# Patient Record
Sex: Female | Born: 1950 | Race: White | Hispanic: No | Smoking: Never smoker
Health system: Southern US, Community
[De-identification: ages and names within clinical notes are randomized; demographics above are authoritative.]

## PROBLEM LIST (undated history)

## (undated) DIAGNOSIS — N189 Chronic kidney disease, unspecified: Secondary | ICD-10-CM

## (undated) DIAGNOSIS — R296 Repeated falls: Secondary | ICD-10-CM

## (undated) DIAGNOSIS — I82409 Acute embolism and thrombosis of unspecified deep veins of unspecified lower extremity: Secondary | ICD-10-CM

## (undated) DIAGNOSIS — D649 Anemia, unspecified: Secondary | ICD-10-CM

## (undated) DIAGNOSIS — L0390 Cellulitis, unspecified: Secondary | ICD-10-CM

## (undated) DIAGNOSIS — Z9889 Other specified postprocedural states: Secondary | ICD-10-CM

## (undated) DIAGNOSIS — E079 Disorder of thyroid, unspecified: Secondary | ICD-10-CM

## (undated) DIAGNOSIS — M797 Fibromyalgia: Secondary | ICD-10-CM

## (undated) DIAGNOSIS — A6920 Lyme disease, unspecified: Secondary | ICD-10-CM

## (undated) DIAGNOSIS — F22 Delusional disorders: Secondary | ICD-10-CM

## (undated) DIAGNOSIS — F909 Attention-deficit hyperactivity disorder, unspecified type: Secondary | ICD-10-CM

## (undated) DIAGNOSIS — F32A Depression, unspecified: Secondary | ICD-10-CM

## (undated) DIAGNOSIS — Z8719 Personal history of other diseases of the digestive system: Secondary | ICD-10-CM

## (undated) DIAGNOSIS — G43109 Migraine with aura, not intractable, without status migrainosus: Secondary | ICD-10-CM

## (undated) DIAGNOSIS — G8929 Other chronic pain: Secondary | ICD-10-CM

## (undated) HISTORY — DX: Chronic kidney disease, unspecified: N18.9

## (undated) HISTORY — DX: Fibromyalgia: M79.7

## (undated) HISTORY — DX: Attention-deficit hyperactivity disorder, unspecified type: F90.9

## (undated) HISTORY — DX: Repeated falls: R29.6

## (undated) HISTORY — DX: Other chronic pain: G89.29

## (undated) HISTORY — DX: Personal history of other diseases of the digestive system: Z87.19

## (undated) HISTORY — PX: EYE SURGERY: SHX253

## (undated) HISTORY — DX: Disorder of thyroid, unspecified: E07.9

## (undated) HISTORY — DX: Migraine with aura, not intractable, without status migrainosus: G43.109

## (undated) HISTORY — DX: Personal history of other diseases of the digestive system: Z98.890

## (undated) HISTORY — DX: Lyme disease, unspecified: A69.20

## (undated) HISTORY — DX: Delusional disorders: F22

## (undated) HISTORY — DX: Anemia, unspecified: D64.9

## (undated) HISTORY — DX: Depression, unspecified: F32.A

## (undated) HISTORY — PX: CERVICAL FUSION: SHX112

## (undated) HISTORY — PX: TOTAL HIP ARTHROPLASTY: SHX124

---

## 1969-07-19 HISTORY — PX: KIDNEY SURGERY: SHX687

## 1972-07-19 HISTORY — PX: APPENDECTOMY: SHX54

## 1983-07-20 HISTORY — PX: LAMINECTOMY: SHX219

## 1997-07-19 HISTORY — PX: OTHER SURGICAL HISTORY: SHX169

## 1999-06-23 ENCOUNTER — Ambulatory Visit (HOSPITAL_COMMUNITY): Admission: RE | Admit: 1999-06-23 | Discharge: 1999-06-23 | Payer: Self-pay | Admitting: Gastroenterology

## 2000-04-28 ENCOUNTER — Encounter: Admission: RE | Admit: 2000-04-28 | Discharge: 2000-04-28 | Payer: Self-pay | Admitting: Internal Medicine

## 2000-04-28 ENCOUNTER — Encounter: Payer: Self-pay | Admitting: Internal Medicine

## 2000-05-27 ENCOUNTER — Encounter: Admission: RE | Admit: 2000-05-27 | Discharge: 2000-05-27 | Payer: Self-pay | Admitting: Internal Medicine

## 2000-10-11 ENCOUNTER — Emergency Department (HOSPITAL_COMMUNITY): Admission: EM | Admit: 2000-10-11 | Discharge: 2000-10-11 | Payer: Self-pay | Admitting: *Deleted

## 2000-10-13 ENCOUNTER — Encounter: Payer: Self-pay | Admitting: Internal Medicine

## 2000-10-13 ENCOUNTER — Encounter: Admission: RE | Admit: 2000-10-13 | Discharge: 2000-10-13 | Payer: Self-pay | Admitting: Internal Medicine

## 2000-11-02 ENCOUNTER — Encounter: Payer: Self-pay | Admitting: Internal Medicine

## 2000-11-02 ENCOUNTER — Encounter: Admission: RE | Admit: 2000-11-02 | Discharge: 2000-11-02 | Payer: Self-pay | Admitting: Internal Medicine

## 2001-09-27 ENCOUNTER — Encounter: Admission: RE | Admit: 2001-09-27 | Discharge: 2001-09-27 | Payer: Self-pay | Admitting: Internal Medicine

## 2001-09-27 ENCOUNTER — Encounter: Payer: Self-pay | Admitting: Internal Medicine

## 2001-12-21 ENCOUNTER — Encounter: Payer: Self-pay | Admitting: Pulmonary Disease

## 2001-12-21 ENCOUNTER — Ambulatory Visit (HOSPITAL_COMMUNITY): Admission: RE | Admit: 2001-12-21 | Discharge: 2001-12-21 | Payer: Self-pay | Admitting: Pulmonary Disease

## 2001-12-28 ENCOUNTER — Ambulatory Visit (HOSPITAL_COMMUNITY): Admission: RE | Admit: 2001-12-28 | Discharge: 2001-12-28 | Payer: Self-pay | Admitting: Pulmonary Disease

## 2002-05-21 ENCOUNTER — Encounter: Admission: RE | Admit: 2002-05-21 | Discharge: 2002-05-21 | Payer: Self-pay | Admitting: Internal Medicine

## 2002-05-21 ENCOUNTER — Encounter: Payer: Self-pay | Admitting: Internal Medicine

## 2003-02-13 ENCOUNTER — Encounter: Payer: Self-pay | Admitting: Internal Medicine

## 2003-02-13 ENCOUNTER — Encounter: Admission: RE | Admit: 2003-02-13 | Discharge: 2003-02-13 | Payer: Self-pay | Admitting: Internal Medicine

## 2003-04-10 ENCOUNTER — Encounter
Admission: RE | Admit: 2003-04-10 | Discharge: 2003-07-09 | Payer: Self-pay | Admitting: Physical Medicine & Rehabilitation

## 2003-07-08 ENCOUNTER — Encounter: Admission: RE | Admit: 2003-07-08 | Discharge: 2003-07-08 | Payer: Self-pay | Admitting: Orthopedic Surgery

## 2003-07-23 ENCOUNTER — Encounter: Admission: RE | Admit: 2003-07-23 | Discharge: 2003-07-23 | Payer: Self-pay | Admitting: Internal Medicine

## 2003-08-06 ENCOUNTER — Ambulatory Visit (HOSPITAL_COMMUNITY): Admission: RE | Admit: 2003-08-06 | Discharge: 2003-08-06 | Payer: Self-pay | Admitting: Neurology

## 2003-09-04 ENCOUNTER — Ambulatory Visit (HOSPITAL_COMMUNITY): Admission: RE | Admit: 2003-09-04 | Discharge: 2003-09-05 | Payer: Self-pay | Admitting: Neurology

## 2003-09-06 ENCOUNTER — Encounter: Admission: RE | Admit: 2003-09-06 | Discharge: 2003-09-06 | Payer: Self-pay | Admitting: Orthopedic Surgery

## 2005-05-22 ENCOUNTER — Encounter: Admission: RE | Admit: 2005-05-22 | Discharge: 2005-05-22 | Payer: Self-pay | Admitting: Orthopedic Surgery

## 2005-11-26 ENCOUNTER — Encounter: Admission: RE | Admit: 2005-11-26 | Discharge: 2005-11-26 | Payer: Self-pay | Admitting: Internal Medicine

## 2006-08-23 ENCOUNTER — Encounter: Admission: RE | Admit: 2006-08-23 | Discharge: 2006-08-23 | Payer: Self-pay | Admitting: Specialist

## 2006-11-03 ENCOUNTER — Encounter: Admission: RE | Admit: 2006-11-03 | Discharge: 2006-11-03 | Payer: Self-pay | Admitting: Specialist

## 2006-12-02 ENCOUNTER — Other Ambulatory Visit: Admission: RE | Admit: 2006-12-02 | Discharge: 2006-12-02 | Payer: Self-pay | Admitting: Obstetrics and Gynecology

## 2007-01-12 ENCOUNTER — Encounter: Admission: RE | Admit: 2007-01-12 | Discharge: 2007-01-12 | Payer: Self-pay | Admitting: Specialist

## 2007-04-17 ENCOUNTER — Other Ambulatory Visit: Admission: RE | Admit: 2007-04-17 | Discharge: 2007-04-17 | Payer: Self-pay | Admitting: Obstetrics and Gynecology

## 2008-04-30 ENCOUNTER — Encounter: Admission: RE | Admit: 2008-04-30 | Discharge: 2008-04-30 | Payer: Self-pay | Admitting: Obstetrics and Gynecology

## 2008-05-13 ENCOUNTER — Other Ambulatory Visit: Admission: RE | Admit: 2008-05-13 | Discharge: 2008-05-13 | Payer: Self-pay | Admitting: Obstetrics and Gynecology

## 2008-05-22 ENCOUNTER — Inpatient Hospital Stay (HOSPITAL_COMMUNITY): Admission: RE | Admit: 2008-05-22 | Discharge: 2008-05-28 | Payer: Self-pay | Admitting: Orthopedic Surgery

## 2009-01-07 ENCOUNTER — Inpatient Hospital Stay (HOSPITAL_COMMUNITY): Admission: AD | Admit: 2009-01-07 | Discharge: 2009-01-12 | Payer: Self-pay | Admitting: Orthopedic Surgery

## 2009-01-20 ENCOUNTER — Observation Stay (HOSPITAL_COMMUNITY): Admission: EM | Admit: 2009-01-20 | Discharge: 2009-01-21 | Payer: Self-pay | Admitting: Emergency Medicine

## 2009-01-21 ENCOUNTER — Ambulatory Visit: Payer: Self-pay | Admitting: Vascular Surgery

## 2009-01-21 ENCOUNTER — Encounter (INDEPENDENT_AMBULATORY_CARE_PROVIDER_SITE_OTHER): Payer: Self-pay | Admitting: Orthopedic Surgery

## 2009-02-07 ENCOUNTER — Observation Stay (HOSPITAL_COMMUNITY): Admission: EM | Admit: 2009-02-07 | Discharge: 2009-02-08 | Payer: Self-pay | Admitting: Emergency Medicine

## 2009-02-15 ENCOUNTER — Inpatient Hospital Stay (HOSPITAL_COMMUNITY): Admission: RE | Admit: 2009-02-15 | Discharge: 2009-02-20 | Payer: Self-pay | Admitting: Orthopedic Surgery

## 2009-03-27 ENCOUNTER — Emergency Department (HOSPITAL_COMMUNITY): Admission: EM | Admit: 2009-03-27 | Discharge: 2009-03-27 | Payer: Self-pay | Admitting: Emergency Medicine

## 2009-04-25 ENCOUNTER — Other Ambulatory Visit: Admission: RE | Admit: 2009-04-25 | Discharge: 2009-04-25 | Payer: Self-pay | Admitting: Obstetrics and Gynecology

## 2010-02-19 ENCOUNTER — Ambulatory Visit: Payer: Self-pay | Admitting: Vascular Surgery

## 2010-04-02 ENCOUNTER — Ambulatory Visit (HOSPITAL_COMMUNITY): Admission: RE | Admit: 2010-04-02 | Discharge: 2010-04-02 | Payer: Self-pay | Admitting: Orthopedic Surgery

## 2010-05-07 ENCOUNTER — Encounter: Admission: RE | Admit: 2010-05-07 | Discharge: 2010-05-07 | Payer: Self-pay | Admitting: Orthopedic Surgery

## 2010-05-08 ENCOUNTER — Encounter
Admission: RE | Admit: 2010-05-08 | Discharge: 2010-05-08 | Payer: Self-pay | Source: Home / Self Care | Admitting: Orthopedic Surgery

## 2010-08-06 ENCOUNTER — Inpatient Hospital Stay (HOSPITAL_COMMUNITY)
Admission: AD | Admit: 2010-08-06 | Discharge: 2010-08-07 | Payer: Self-pay | Attending: Internal Medicine | Admitting: Internal Medicine

## 2010-08-08 ENCOUNTER — Encounter: Payer: Self-pay | Admitting: Orthopedic Surgery

## 2010-08-08 ENCOUNTER — Encounter: Payer: Self-pay | Admitting: Obstetrics and Gynecology

## 2010-08-08 ENCOUNTER — Encounter: Payer: Self-pay | Admitting: Neurology

## 2010-08-09 ENCOUNTER — Encounter: Payer: Self-pay | Admitting: Obstetrics and Gynecology

## 2010-08-09 ENCOUNTER — Encounter: Payer: Self-pay | Admitting: Orthopedic Surgery

## 2010-08-10 LAB — COMPREHENSIVE METABOLIC PANEL
ALT: 21 U/L (ref 0–35)
AST: 26 U/L (ref 0–37)
Albumin: 3.8 g/dL (ref 3.5–5.2)
Alkaline Phosphatase: 90 U/L (ref 39–117)
BUN: 21 mg/dL (ref 6–23)
CO2: 25 mEq/L (ref 19–32)
Calcium: 9.1 mg/dL (ref 8.4–10.5)
Chloride: 109 mEq/L (ref 96–112)
Creatinine, Ser: 0.91 mg/dL (ref 0.4–1.2)
GFR calc Af Amer: >60 mL/min (ref >60)
GFR calc non Af Amer: >60 mL/min (ref >60)
Glucose, Bld: 92 mg/dL (ref 70–99)
Potassium: 4.1 mEq/L (ref 3.5–5.1)
Sodium: 142 mEq/L (ref 135–145)
Total Bilirubin: 0.4 mg/dL (ref 0.3–1.2)
Total Protein: 6.6 g/dL (ref 6.0–8.3)

## 2010-08-10 LAB — PROTIME-INR
INR: 1.02 (ref 0.00–1.49)
Prothrombin Time: 13.6 seconds (ref 11.6–15.2)

## 2010-08-10 LAB — CBC
HCT: 37.4 % (ref 36.0–46.0)
Hemoglobin: 12.3 g/dL (ref 12.0–15.0)
MCH: 29.2 pg (ref 26.0–34.0)
MCHC: 32.9 g/dL (ref 30.0–36.0)
MCV: 88.8 fL (ref 78.0–100.0)
Platelets: 281 10*3/uL (ref 150–400)
RBC: 4.21 MIL/uL (ref 3.87–5.11)
RDW: 14.2 % (ref 11.5–15.5)
WBC: 6.6 10*3/uL (ref 4.0–10.5)

## 2010-08-10 LAB — APTT: aPTT: 28 seconds (ref 24–37)

## 2010-09-08 ENCOUNTER — Encounter (INDEPENDENT_AMBULATORY_CARE_PROVIDER_SITE_OTHER): Payer: BC Managed Care – HMO | Admitting: Vascular Surgery

## 2010-09-08 DIAGNOSIS — S3289XB Fracture of other parts of pelvis, initial encounter for open fracture: Secondary | ICD-10-CM

## 2010-09-08 DIAGNOSIS — M79609 Pain in unspecified limb: Secondary | ICD-10-CM

## 2010-09-09 NOTE — Consult Note (Signed)
NEW PATIENT CONSULTATION  [name], [name] DOB:  [birth year]                                       09/08/2010 ZOXWR#:[id number]  The patient presents today for discussion regarding complications with a left hip replacement.  She has a very complex history with initial left hip replacement in 2009.  She subsequently had a right hip replacement and complications requiring multiple revisions.  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  She has consulted with multiple physicians regarding this.  She did see an orthopedic specialist in [place], who apparently suggested that this she have a team of vascular surgeons available.  She is quite concerned regarding this and was seen today to determine if vascular compromise is present.  She does not have any history of arterial insufficiency.  She does have extensive history of prior cervical spine fusion.  She has no history of cardiac disease, does not have hypertension.  She does not smoke or drink alcohol.  REVIEW OF SYSTEMS:  Multiply positive.  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  PHYSICAL EXAMINATION:  A well-developed, thin white female in no acute distress.  She is using a wheelchair.  Blood pressure is 117/81, pulse 101, respirations 18, oxygen saturation is 99%.  HEENT is normal.  She does have a 2+ popliteal pulses bilaterally.  Her left lower quadrant does not have any tenderness.  She does have obvious deformity from the impacted left hip.  Neurologic reveals no focal weakness.  I did review her CT scans and discussed this at length with the patient. I explained that these were noncontrast studies but that she does not have any direct impingement on her vascular structures in the  left groin.  She has normal pulse and therefore does not have any evidence of compromise currently.  I explained that if she does have a surgeon who has the expertise for a pelvic reconstruction, he would certainly know whether or not vascular backup was warranted.  I explained that it is not rare to see a vascular injury during orthopedic manipulation and that this is addressed if this does occur.  She was somewhat relieved with this discussion and will continue to seek orthopedic specialty care for correction of her problem.  She will see [place] on an as-needed basis.    Larina Earthly, M.Young.  TFE/MEDQ  Young:  09/08/2010  T:  09/09/2010  Job:  [id number]  cc:   Candyce Churn, M.Young. Mindi Slicker

## 2010-09-21 ENCOUNTER — Other Ambulatory Visit: Payer: Self-pay | Admitting: *Deleted

## 2010-09-21 DIAGNOSIS — M25559 Pain in unspecified hip: Secondary | ICD-10-CM

## 2010-09-22 ENCOUNTER — Ambulatory Visit
Admission: RE | Admit: 2010-09-22 | Discharge: 2010-09-22 | Disposition: A | Discharge status: Home / Self Care | Payer: BC Managed Care – HMO | Source: Ambulatory Visit | Attending: *Deleted | Admitting: *Deleted

## 2010-09-22 DIAGNOSIS — M25559 Pain in unspecified hip: Secondary | ICD-10-CM

## 2010-09-22 MED ORDER — IOHEXOL 300 MG/ML  SOLN
100.0000 mL | Freq: Once | INTRAMUSCULAR | Status: AC | PRN
  Administered 2010-09-22: 100 mL via INTRAVENOUS

## 2010-10-24 LAB — PROTIME-INR
INR: 1.1 (ref 0.00–1.49)
INR: 1.9 — ABNORMAL HIGH (ref 0.00–1.49)
INR: 1.9 — ABNORMAL HIGH (ref 0.00–1.49)
INR: 2 — ABNORMAL HIGH (ref 0.00–1.49)
INR: 2.2 — ABNORMAL HIGH (ref 0.00–1.49)
Prothrombin Time: 14.7 seconds (ref 11.6–15.2)
Prothrombin Time: 21.5 seconds — ABNORMAL HIGH (ref 11.6–15.2)
Prothrombin Time: 22.7 seconds — ABNORMAL HIGH (ref 11.6–15.2)
Prothrombin Time: 23.1 seconds — ABNORMAL HIGH (ref 11.6–15.2)
Prothrombin Time: 24.1 seconds — ABNORMAL HIGH (ref 11.6–15.2)

## 2010-10-24 LAB — BASIC METABOLIC PANEL
BUN: 11 mg/dL (ref 6–23)
BUN: 9 mg/dL (ref 6–23)
CO2: 25 mEq/L (ref 19–32)
CO2: 26 mEq/L (ref 19–32)
Calcium: 7.9 mg/dL — ABNORMAL LOW (ref 8.4–10.5)
Calcium: 8 mg/dL — ABNORMAL LOW (ref 8.4–10.5)
Chloride: 112 mEq/L (ref 96–112)
Chloride: 112 mEq/L (ref 96–112)
Creatinine, Ser: 0.95 mg/dL (ref 0.4–1.2)
Creatinine, Ser: 1.05 mg/dL (ref 0.4–1.2)
GFR calc Af Amer: >60 mL/min (ref >60)
GFR calc Af Amer: >60 mL/min (ref >60)
GFR calc non Af Amer: 54 mL/min — ABNORMAL LOW (ref >60)
GFR calc non Af Amer: >60 mL/min (ref >60)
Glucose, Bld: 113 mg/dL — ABNORMAL HIGH (ref 70–99)
Glucose, Bld: 116 mg/dL — ABNORMAL HIGH (ref 70–99)
Potassium: 3.6 mEq/L (ref 3.5–5.1)
Potassium: 3.6 mEq/L (ref 3.5–5.1)
Sodium: 140 mEq/L (ref 135–145)
Sodium: 141 mEq/L (ref 135–145)

## 2010-10-24 LAB — CBC
HCT: 29.4 % — ABNORMAL LOW (ref 36.0–46.0)
HCT: 30 % — ABNORMAL LOW (ref 36.0–46.0)
Hemoglobin: 10 g/dL — ABNORMAL LOW (ref 12.0–15.0)
Hemoglobin: 9.7 g/dL — ABNORMAL LOW (ref 12.0–15.0)
MCHC: 33 g/dL (ref 30.0–36.0)
MCHC: 33.2 g/dL (ref 30.0–36.0)
MCV: 92.9 fL (ref 78.0–100.0)
MCV: 94.3 fL (ref 78.0–100.0)
Platelets: 236 10*3/uL (ref 150–400)
Platelets: 250 10*3/uL (ref 150–400)
RBC: 3.16 MIL/uL — ABNORMAL LOW (ref 3.87–5.11)
RBC: 3.19 MIL/uL — ABNORMAL LOW (ref 3.87–5.11)
RDW: 13.4 % (ref 11.5–15.5)
RDW: 13.8 % (ref 11.5–15.5)
WBC: 10.1 10*3/uL (ref 4.0–10.5)
WBC: 9.7 10*3/uL (ref 4.0–10.5)

## 2010-10-25 LAB — COMPREHENSIVE METABOLIC PANEL
ALT: 24 U/L (ref 0–35)
AST: 25 U/L (ref 0–37)
Albumin: 3.9 g/dL (ref 3.5–5.2)
Alkaline Phosphatase: 85 U/L (ref 39–117)
BUN: 26 mg/dL — ABNORMAL HIGH (ref 6–23)
CO2: 26 mEq/L (ref 19–32)
Calcium: 9.1 mg/dL (ref 8.4–10.5)
Chloride: 105 mEq/L (ref 96–112)
Creatinine, Ser: 1.06 mg/dL (ref 0.4–1.2)
GFR calc Af Amer: >60 mL/min (ref >60)
GFR calc non Af Amer: 53 mL/min — ABNORMAL LOW (ref >60)
Glucose, Bld: 87 mg/dL (ref 70–99)
Potassium: 4.7 mEq/L (ref 3.5–5.1)
Sodium: 137 mEq/L (ref 135–145)
Total Bilirubin: 0.3 mg/dL (ref 0.3–1.2)
Total Protein: 6.5 g/dL (ref 6.0–8.3)

## 2010-10-25 LAB — CBC
HCT: 31.2 % — ABNORMAL LOW (ref 36.0–46.0)
HCT: 36.6 % (ref 36.0–46.0)
HCT: 31.1 % — ABNORMAL LOW (ref 36.0–46.0)
Hemoglobin: 10.2 g/dL — ABNORMAL LOW (ref 12.0–15.0)
Hemoglobin: 12.1 g/dL (ref 12.0–15.0)
Hemoglobin: 10.5 g/dL — ABNORMAL LOW (ref 12.0–15.0)
MCHC: 32.7 g/dL (ref 30.0–36.0)
MCHC: 33.2 g/dL (ref 30.0–36.0)
MCHC: 33.7 g/dL (ref 30.0–36.0)
MCV: 93 fL (ref 78.0–100.0)
MCV: 93.5 fL (ref 78.0–100.0)
MCV: 93.4 fL (ref 78.0–100.0)
Platelets: 229 10*3/uL (ref 150–400)
Platelets: 290 10*3/uL (ref 150–400)
Platelets: 350 10*3/uL (ref 150–400)
RBC: 3.36 MIL/uL — ABNORMAL LOW (ref 3.87–5.11)
RBC: 3.91 MIL/uL (ref 3.87–5.11)
RBC: 3.33 MIL/uL — ABNORMAL LOW (ref 3.87–5.11)
RDW: 13.8 % (ref 11.5–15.5)
RDW: 13.9 % (ref 11.5–15.5)
RDW: 14 % (ref 11.5–15.5)
WBC: 4.9 10*3/uL (ref 4.0–10.5)
WBC: 9.3 10*3/uL (ref 4.0–10.5)
WBC: 10.1 10*3/uL (ref 4.0–10.5)

## 2010-10-25 LAB — BASIC METABOLIC PANEL
BUN: 15 mg/dL (ref 6–23)
CO2: 25 mEq/L (ref 19–32)
Calcium: 7.9 mg/dL — ABNORMAL LOW (ref 8.4–10.5)
Chloride: 112 mEq/L (ref 96–112)
Creatinine, Ser: 0.91 mg/dL (ref 0.4–1.2)
GFR calc Af Amer: >60 mL/min (ref >60)
GFR calc non Af Amer: >60 mL/min (ref >60)
Glucose, Bld: 87 mg/dL (ref 70–99)
Potassium: 3.6 mEq/L (ref 3.5–5.1)
Sodium: 140 mEq/L (ref 135–145)

## 2010-10-25 LAB — URINALYSIS, ROUTINE W REFLEX MICROSCOPIC
Bilirubin Urine: NEGATIVE
Glucose, UA: NEGATIVE mg/dL
Hgb urine dipstick: NEGATIVE
Ketones, ur: NEGATIVE mg/dL
Nitrite: NEGATIVE
Protein, ur: NEGATIVE mg/dL
Specific Gravity, Urine: 1.02 (ref 1.005–1.030)
Urobilinogen, UA: 0.2 mg/dL (ref 0.0–1.0)
pH: 5 (ref 5.0–8.0)

## 2010-10-25 LAB — TYPE AND SCREEN
ABO/RH(D): O POS
Antibody Screen: NEGATIVE

## 2010-10-25 LAB — PROTIME-INR
INR: 1 (ref 0.00–1.49)
Prothrombin Time: 13.5 seconds (ref 11.6–15.2)

## 2010-10-25 LAB — APTT: aPTT: 23 seconds — ABNORMAL LOW (ref 24–37)

## 2010-10-26 LAB — CBC
HCT: 29.5 % — ABNORMAL LOW (ref 36.0–46.0)
HCT: 32.3 % — ABNORMAL LOW (ref 36.0–46.0)
HCT: 40.3 % (ref 36.0–46.0)
Hemoglobin: 10.7 g/dL — ABNORMAL LOW (ref 12.0–15.0)
Hemoglobin: 13.4 g/dL (ref 12.0–15.0)
Hemoglobin: 9.9 g/dL — ABNORMAL LOW (ref 12.0–15.0)
MCHC: 33.2 g/dL (ref 30.0–36.0)
MCHC: 33.3 g/dL (ref 30.0–36.0)
MCHC: 33.6 g/dL (ref 30.0–36.0)
MCV: 94 fL (ref 78.0–100.0)
MCV: 94.2 fL (ref 78.0–100.0)
MCV: 94.4 fL (ref 78.0–100.0)
Platelets: 210 10*3/uL (ref 150–400)
Platelets: 242 10*3/uL (ref 150–400)
Platelets: 260 10*3/uL (ref 150–400)
RBC: 3.13 MIL/uL — ABNORMAL LOW (ref 3.87–5.11)
RBC: 3.42 MIL/uL — ABNORMAL LOW (ref 3.87–5.11)
RBC: 4.28 MIL/uL (ref 3.87–5.11)
RDW: 13.6 % (ref 11.5–15.5)
RDW: 13.7 % (ref 11.5–15.5)
RDW: 13.7 % (ref 11.5–15.5)
WBC: 10.1 10*3/uL (ref 4.0–10.5)
WBC: 8 10*3/uL (ref 4.0–10.5)
WBC: 8.4 10*3/uL (ref 4.0–10.5)

## 2010-10-26 LAB — HEMOGLOBIN AND HEMATOCRIT, BLOOD
HCT: 28.6 % — ABNORMAL LOW (ref 36.0–46.0)
HCT: 29 % — ABNORMAL LOW (ref 36.0–46.0)
Hemoglobin: 9.7 g/dL — ABNORMAL LOW (ref 12.0–15.0)
Hemoglobin: 9.7 g/dL — ABNORMAL LOW (ref 12.0–15.0)

## 2010-10-26 LAB — URINALYSIS, ROUTINE W REFLEX MICROSCOPIC
Bilirubin Urine: NEGATIVE
Glucose, UA: NEGATIVE mg/dL
Hgb urine dipstick: NEGATIVE
Ketones, ur: NEGATIVE mg/dL
Nitrite: NEGATIVE
Protein, ur: NEGATIVE mg/dL
Specific Gravity, Urine: 1.015 (ref 1.005–1.030)
Urobilinogen, UA: 0.2 mg/dL (ref 0.0–1.0)
pH: 5.5 (ref 5.0–8.0)

## 2010-10-26 LAB — COMPREHENSIVE METABOLIC PANEL
ALT: 25 U/L (ref 0–35)
ALT: 28 U/L (ref 0–35)
AST: 27 U/L (ref 0–37)
AST: 36 U/L (ref 0–37)
Albumin: 2.5 g/dL — ABNORMAL LOW (ref 3.5–5.2)
Albumin: 3.7 g/dL (ref 3.5–5.2)
Alkaline Phosphatase: 68 U/L (ref 39–117)
Alkaline Phosphatase: 78 U/L (ref 39–117)
BUN: 16 mg/dL (ref 6–23)
BUN: 6 mg/dL (ref 6–23)
CO2: 24 mEq/L (ref 19–32)
CO2: 24 mEq/L (ref 19–32)
Calcium: 8.2 mg/dL — ABNORMAL LOW (ref 8.4–10.5)
Calcium: 8.9 mg/dL (ref 8.4–10.5)
Chloride: 108 mEq/L (ref 96–112)
Chloride: 112 mEq/L (ref 96–112)
Creatinine, Ser: 0.96 mg/dL (ref 0.4–1.2)
Creatinine, Ser: 1.08 mg/dL (ref 0.4–1.2)
GFR calc Af Amer: >60 mL/min (ref >60)
GFR calc Af Amer: >60 mL/min (ref >60)
GFR calc non Af Amer: 52 mL/min — ABNORMAL LOW (ref >60)
GFR calc non Af Amer: 60 mL/min — ABNORMAL LOW (ref >60)
Glucose, Bld: 117 mg/dL — ABNORMAL HIGH (ref 70–99)
Glucose, Bld: 93 mg/dL (ref 70–99)
Potassium: 3.9 mEq/L (ref 3.5–5.1)
Potassium: 4.3 mEq/L (ref 3.5–5.1)
Sodium: 139 mEq/L (ref 135–145)
Sodium: 140 mEq/L (ref 135–145)
Total Bilirubin: 0.2 mg/dL — ABNORMAL LOW (ref 0.3–1.2)
Total Bilirubin: 0.4 mg/dL (ref 0.3–1.2)
Total Protein: 5.2 g/dL — ABNORMAL LOW (ref 6.0–8.3)
Total Protein: 6.4 g/dL (ref 6.0–8.3)

## 2010-10-26 LAB — DIFFERENTIAL
Basophils Absolute: 0 10*3/uL (ref 0.0–0.1)
Basophils Relative: 0 % (ref 0–1)
Eosinophils Absolute: 0.7 10*3/uL (ref 0.0–0.7)
Eosinophils Relative: 7 % — ABNORMAL HIGH (ref 0–5)
Lymphocytes Relative: 12 % (ref 12–46)
Lymphs Abs: 1.2 10*3/uL (ref 0.7–4.0)
Monocytes Absolute: 0.8 10*3/uL (ref 0.1–1.0)
Monocytes Relative: 8 % (ref 3–12)
Neutro Abs: 7.3 10*3/uL (ref 1.7–7.7)
Neutrophils Relative %: 73 % (ref 43–77)

## 2010-10-26 LAB — AMYLASE: Amylase: 86 U/L (ref 27–131)

## 2010-10-26 LAB — D-DIMER, QUANTITATIVE: D-Dimer, Quant: 2.18 ug/mL-FEU — ABNORMAL HIGH (ref 0.00–0.48)

## 2010-10-26 LAB — APTT: aPTT: 28 seconds (ref 24–37)

## 2010-10-26 LAB — LIPASE, BLOOD: Lipase: 12 U/L (ref 11–59)

## 2010-10-26 LAB — PROTIME-INR
INR: 1 (ref 0.00–1.49)
Prothrombin Time: 13.5 seconds (ref 11.6–15.2)

## 2010-12-01 NOTE — Op Note (Signed)
[name], [name]               ACCOUNT NO.:  [id number]   MEDICAL RECORD NO.:  [id number]          PATIENT TYPE:  OIB   LOCATION:  1604                         FACILITY:  [facility]   PHYSICIAN:  Ollen Gross, M.D.    DATE OF BIRTH:  23-Dec-1950   DATE OF PROCEDURE:  01/07/2009  DATE OF DISCHARGE:                               OPERATIVE REPORT   PREOPERATIVE DIAGNOSIS:  Right greater trochanteric fracture/nonunion.   POSTOPERATIVE DIAGNOSIS:  Right greater trochanteric fracture/nonunion.   PROCEDURE:  Open reduction internal fixation of greater trochanter,  nonunion, right hip.   SURGEON:  Ollen Gross, M.D.   ASSISTANT:  Avel Peace, P.A.-C.   ANESTHESIA:  General.   BLOOD LOSS:  300.   DRAIN:  Hemovac x1.   COMPLICATIONS:  None.   CONDITION:  Stable to recovery.   BRIEF CLINICAL NOTE:  [name] is a 60 year old female who had a  right total hip arthroplasty done last fall.  Did well initially and  then had an episode where she fell down.  She had a dislocation shortly  after that.  [place], and when she came back home, we  noted that she had a fracture of right greater trochanter.  There was a  small fragment with minimal displacement.  We initially decided to see  if it potentially would heal on its own.  Subsequent evaluation 8 months  later revealed that it was not healing, and she was starting to get some  mechanical-type symptoms.  I recommended we attempt to fix, first resect  it and repair the tendon back to the femur.  She had wanted to wait  until the school year was out, and despite counseling otherwise, she  presents now for open reduction internal fixation versus resection of  the right greater trochanter.   PROCEDURE IN DETAIL:  After successful administration of general  anesthetic, the patient was placed in left lateral decubitus position  with the right side up and held with the hip positioner.  Right lower  extremity was  isolated from the perineum with plastic drapes and prepped  and draped in usual sterile fashion.  Previous posterolateral incision  was reutilized, skin cut with a 10 blade through subcutaneous tissue to  the fascia lata which was incised in line with the skin incision.  There  was a very hypertrophic thickened bursa in this area.  The bursa was  removed.  There was soft tissue intervening between the greater  trochanter and the femur.  I debrided all soft tissue on the distal  aspect first to get down to healthy bleeding bone and then proximally  excised scar tissue around the greater trochanter and debrided the soft  tissue off the bone to get to healthy bleeding bone.  With the hip in  abduction, I was able to advance the trochanter back to the prepared bed  on the femur.  I held this in place, and then we utilized the Zimmer  short trochanteric clamp in order to fix this.  I passed 2 cables below  the greater trochanter and passed  them through the plate.  The plate was  impacted onto the superior aspect of the trochanter.  We had good,  stable fixation to the bone.  We passed the cables through the plate and  sequentially tightened both cable with the tightening device.  Once they  were adequately tightened, then we cut the excess cable.  I placed the  hip through a range of motion.  It was stable throughout, and the  trochanter was very stably fixed back to the femur.  I then copiously  irrigated the wound with saline solution and closed the fascia lata over  a Hemovac drain with interrupted #1 Vicryl, subcutaneous with #1-0 and  #2-0 Vicryl, and subcuticular running 4-0 Monocryl.  Drain was hooked to  suction, incision cleaned and dried, and bulky sterile dressing applied.  She was then awakened and transported to recovery in stable condition.      Ollen Gross, M.D.  Electronically Signed     FA/MEDQ  D:  01/07/2009  T:  01/08/2009  Job:  [id number]

## 2010-12-01 NOTE — Op Note (Signed)
[name], [name]               ACCOUNT NO.:  [id number]   MEDICAL RECORD NO.:  [id number]          PATIENT TYPE:  OBV   LOCATION:  1618                         FACILITY:  [facility]   PHYSICIAN:  Jene Every, M.D.    DATE OF BIRTH:  02-26-1951   DATE OF PROCEDURE:  02/07/2009  DATE OF DISCHARGE:                               OPERATIVE REPORT   PREOPERATIVE DIAGNOSIS:  Dislocated right total hip replacement.   POSTOPERATIVE DIAGNOSIS:  Dislocated right total hip replacement.   PROCEDURE PERFORMED:  Closed reduction under anesthesia right hip  followed by fluoroscopic stress radiographs.   ANESTHESIA:  General.   ASSISTANT:  Ollen Gross, M.D.   BRIEF HISTORY AND INDICATIONS:  This is a 60 year old female who is  status post revision total hip arthroplasty by Dr. Lequita Halt.  She this  morning apparently set down on a seat, felt a redislocation of her hip  with immediate pain, was seen in the emergency room with a shortened  right lower extremity with external rotation.  She was examined  preoperatively with findings consistent with a dislocated hip  replacement.  She was neurovascular intact with the radiographs  demonstrating a posterior and superior hip dislocation without  dissociation of the components.  She is indicated for closed reduction  under anesthesia.  The risks and benefits were discussed including  inability to reduce, need for open revision, DVT, PE, anesthetic  complications, etc.   TECHNIQUE:  With the patient in supine position after induction of  adequate general anesthesia, pelvis stabilized, hip flexed in gentle  longitudinal traction with internal rotation, produced a relocation of  the hip without difficulty.  Leg lengths were then restored to  equivalent.  She had a stable hip throughout a full range of motion  following this with post reduction radiographs in the AP and frog  lateral demonstrating a concentric located hip, no fracture or  disassociation of the components.   The patient was then placed a knee immobilizer, extubated without  difficulty and transported to recovery room in satisfactory condition.   The patient tolerated the procedure well with no complication.  Admitted  for observation, will be discharged to follow up with Ollen Gross,  M.D. in a week or two.      Jene Every, M.D.  Electronically Signed     JB/MEDQ  D:  02/07/2009  T:  02/08/2009  Job:  [id number]

## 2010-12-01 NOTE — Op Note (Signed)
[name], [name]               ACCOUNT NO.:  [id number]   MEDICAL RECORD NO.:  [id number]          PATIENT TYPE:  INP   LOCATION:  0098                         FACILITY:  [facility]   PHYSICIAN:  Leonides Grills, M.D.     DATE OF BIRTH:  03/06/1951   DATE OF PROCEDURE:  01/20/2009  DATE OF DISCHARGE:                               OPERATIVE REPORT   PREOPERATIVE DIAGNOSIS:  Dislocated right total hip arthroplasty.   POSTOPERATIVE DIAGNOSIS:  Dislocated right total hip arthroplasty.   OPERATION:  Closed reduction under anesthesia of right dislocated total  hip arthroplasty.   ANESTHESIA:  General.   SURGEON:  Leonides Grills, MD.   ASSISTANT:  Richardean Canal, PA-C.   ESTIMATED BLOOD LOSS:  None.   COMPLICATIONS:  None.   DISPOSITION:  Stable to the PR.   INDICATION:  This is a 60 year old female, who sustained the above-  injury that was a superior dislocation.  Two weeks earlier, she had an  open reduction and internal fixation of her greater trochanter by Dr.  Lequita Halt.  She was putting on a heat pack on her thigh when she flexed  her hip to pull her thigh up and her hip dislocated.  She has had 1  previous dislocation in [place] and after it was reduced it was  found that she had a greater trochanteric fracture then.  She was  consented for the above procedure.  All risks including infection, nerve  or vessel injury, the possibility of not putting it back in, iatrogenic  fracture, and the possibility of loosening of the prosthesis were all  explained.  Questions were encouraged and answered.   OPERATION:  The patient brought to the operating room and placed in the  supine position.  After adequate general anesthesia was administered, we  then performed a gentle manipulation with straight traction inferiorly  and the hip reduced relatively easy.  It was ranged and had excellent  range of motion.  We then obtained a postreduction x-ray and it showed a  concentric reduction  right total hip, no fracture.  Patient was stable  to the PR with a knee immobilizer placed.      Leonides Grills, M.D.  Electronically Signed    PB/MEDQ  D:  01/20/2009  T:  01/20/2009  Job:  [id number]

## 2010-12-01 NOTE — Op Note (Signed)
[name], [name]               ACCOUNT NO.:  [id number]   MEDICAL RECORD NO.:  [id number]          PATIENT TYPE:  INP   LOCATION:  1609                         FACILITY:  [facility]   PHYSICIAN:  Ollen Gross, M.D.    DATE OF BIRTH:  02-13-1951   DATE OF PROCEDURE:  05/22/2008  DATE OF DISCHARGE:                               OPERATIVE REPORT   PREOPERATIVE DIAGNOSIS:  Osteoarthritis right hip.   POSTOPERATIVE DIAGNOSIS:  Osteoarthritis right hip.   PROCEDURE:  Right total hip arthroplasty.   SURGEON:  Ollen Gross, M.D.   ASSISTANT:  Avel Peace, PA-C   ANESTHESIA:  General.   BLOOD LOSS:  450 mL.   DRAINS:  Hemovac times one.   COMPLICATIONS:  None.   CONDITION:  Stable to recovery room.   BRIEF CLINICAL NOTE:  [name] is a 60 year old female who has end-stage  arthritis of the right hip with progressively worsening pain and  dysfunction.  She presents now for right total hip arthroplasty.   PROCEDURE IN DETAIL:  After successful administration of general  anesthetic, the patient is placed in left lateral decubitus position  with the right side up and held with the hip positioner.  Right lower  extremity was isolated from her perineum with plastic drapes and prepped  and draped in the usual sterile fashion.  Short posterolateral incision  is made with 10 blade through subcutaneous tissue to the level of fascia  lata which was incised in line with the skin incision.  Sciatic nerve  was palpated and protected and short external rotators isolated off the  femur.  Capsulectomy is performed and the hip was dislocated.  The  center of femoral head is marked and the trial prosthesis is placed such  that the center of the trial head corresponds to center of native  femoral head.  Osteotomy line is marked on the femoral neck and  osteotomy made with an oscillating saw.  Femoral head is removed and the  femur retracted anteriorly to gain acetabular exposure.   Acetabular  retractors were placed and labrum and osteophytes removed.  Acetabular reaming begins at 45 mm coursing in increments of 2 to 51 mm  and a 52 mm Pinnacle acetabular shell was placed in anatomic position  and transfixed with two dome screws.  Trial 32-mm neutral +4 liner was  placed.   The femur is prepared the canal finder and irrigation.  Axial reaming is  performed up to 13.5 mm, proximal reaming to 18 F and the sleeve  machined to a large.  71 F large trial sleeve is placed on 18 x 13 stem  and a 36 +8 neck matching her native anteversion.  The 32.0 head is  placed.  It reduced easily so went to  32 +3 which had a more  appropriate soft tissue tension.  The hip was placed through a range of  motion with excellent stability with full extension, full external  rotation, 70 degrees of flexion, 40 degrees of adduction and 90 degrees  of internal rotation and 90 degrees of flexion and 70 degrees of  internal  rotation.  By placing the right leg on top of the left, it felt  as though leg lengths are equal.  The hips then dislocated and trials  removed.  Permanent apex hole eliminator and permanent 32 mm neutral +4  Marathon liner placed in the acetabular shell.  On the femoral side,  placed a 20 F large sleeve with 18 F large sleeve and 18 x 13 stem and a  36 +8 neck matching native anteversion.  A 32 +3 ceramic head is placed  and the hip was reduced with the same stability parameters as before.  Wound was copiously irrigated with saline solution and short rotators  reattached to femur through drill holes.  Fascia lata was closed over a  Hemovac drain with interrupted #1 Vicryl subcu closed with #1-0 and #2-0  Vicryl and subcuticular with running 4-0 Monocryl.  Incision was cleaned  and dried and Steri-Strips and bulky sterile dressing are applied.  She  is then placed into a knee immobilizer, awakened and transferred to  recovery in stable condition.      Ollen Gross, M.D.   Electronically Signed     FA/MEDQ  D:  05/22/2008  T:  05/23/2008  Job:  [id number]

## 2010-12-01 NOTE — H&P (Signed)
[name], [name]               ACCOUNT NO.:  [id number]   MEDICAL RECORD NO.:  [id number]          PATIENT TYPE:  INP   LOCATION:  NA                           FACILITY:  [facility]   PHYSICIAN:  Ollen Gross, M.D.    DATE OF BIRTH:  [birth year]   DATE OF ADMISSION:  02/15/2009  DATE OF DISCHARGE:                              HISTORY & PHYSICAL   CHIEF COMPLAINT:  Right hip pain.   HISTORY OF PRESENT ILLNESS:  The patient is a 60 year old female well  known to Dr. Ollen Gross, having previously undergone a right total  hip back in November 2009 but has had several problems since that time.  She ended up having a trochanteric fracture which did have surgery to  correct back in June 2010.  She has also had problems with dislocations,  and, most recently, she re-dislocated on January 20, 2009, and had to be  admitted for closed reduction per Dr. Lestine Box.  She has felt that she is  going to need revision surgery.  This was discussed at length with the  patient over the phone by Dr. Lequita Halt.  The patient is subsequently  brought into the hospital for revision surgery.   ALLERGIES:  PENICILLIN which she believes causes hives.  The patient is  able to take Keflex, also documented allergy to [facility].   CURRENT MEDICATIONS:  Synthroid, Wellbutrin, Effexor, Deplin, Topamax,  Dilaudid, Voltaren, Flexeril, Provigil, Lorcet, Lexapro, methocarbamol.   PAST MEDICAL HISTORY:  1. History of migraines, anxiety, depression.  2. History of panic attacks.  3. History of sleep apnea.  4. Past history of phlebitis.  5. History of deep vein thromboses (no PE).  6. Fibromyalgia.  7. Chronic fatigue syndrome.  8. Past history of postoperative staph infection.  9. Postmenopausal.  10.History of mumps.   PAST SURGICAL HISTORY:  Neck fusion x2, laminectomy and lower back  fusion, nephrectomy, vein surgery, carpal tunnel surgery, four eye  surgeries, left total hip approximately 4-5 years ago, right  total hip  replacement November 2009.  She has undergone ORIF of the greater  trochanteric fracture on the right hip.  She has also undergone several  closed reductions for dislocations of the right hip.   FAMILY HISTORY:  Father deceased secondary to heart attack.  Mother with  history of cancer.  Sister with breast cancer.   SOCIAL HISTORY:  Nonsmoker.  Alcohol.  Four children.   REVIEW OF SYSTEMS:  GENERAL:  Fatigue.  She has chronic fatigue  syndrome.  No fevers or chills.  NEURO:  She does have anxiety,  depression.  No seizures, syncope, paralysis.  RESPIRATORY:  No  shortness breath, productive cough or hemoptysis.  CARDIOVASCULAR:  No  chest pain, angina or orthopnea.  GI:  She has had a little bit of  difficulty swallowing in the past with some esophageal issues; no  nausea, vomiting, diarrhea, or constipation.  GU: No dysuria, hematuria.  MUSCULOSKELETAL:  Hip pain.   PHYSICAL EXAMINATION:  VITAL SIGNS AT TIME OF VISIT:  Pulse 73,  respirations 18, blood pressure 87/48.  GENERAL:  A 60 year old  female well nourished, well developed, in mild  distress secondary to pain with her hip and also quite anxious during  visit.  She is alert and oriented, again very anxious at the time.  HEENT:  Normocephalic, atraumatic.  Pupils are round and reactive.  EOMs  intact.  NECK:  Supple.  CHEST:  Clear anteriorly and posteriorly.  She has no rales, no rhonchi,  __________ no wheezing.  HEART:  Regular rate and rhythm.  No murmur.  ABDOMEN:  Soft, nontender.  Bowel sounds present.  RECTAL/BREASTS/GENITALIA.  Not done, not pertinent to present illness.  EXTREMITIES:  Right hip:  Motor function is intact.  There is some  discomfort on hip roll laterally.  Sensation is intact.   IMPRESSION:  Instability with subsequent dislocations, right total hip  arthroplasty.   PLAN:  The patient will be admitted to [facility] to undergo  revision surgery for the right total hip  arthroplasty.  Surgery will be  performed by Ollen Gross.      [name], P.A.C.      Ollen Gross, M.D.  Electronically Signed    ALP/MEDQ  D:  02/14/2009  T:  02/14/2009  Job:  [id number]

## 2010-12-01 NOTE — Op Note (Signed)
[place], DOST               ACCOUNT NO.:  [id number]   MEDICAL RECORD NO.:  [id number]          PATIENT TYPE:  INP   LOCATION:  1604                         FACILITY:  [facility]   PHYSICIAN:  Brantley Persons, M.D.DATE OF BIRTH:  [birth year]   DATE OF PROCEDURE:  02/15/2009  DATE OF DISCHARGE:                               OPERATIVE REPORT   PREOPERATIVE DIAGNOSIS:  Revision of right hip joint with desire for  plastic surgery closure of incision.   POSTOPERATIVE DIAGNOSIS:  Revision of right hip joint with desire for  plastic surgery closure of incision.   PROCEDURE:  Closure of right hip incision.   SURGEON:  Brantley Persons, M.D.   ANESTHESIA:  General.   ANESTHESIOLOGIST:  Jill Side, M.D.   COMPLICATIONS:  None.   INDICATIONS FOR THE PROCEDURE:  The patient is a 60 year old Caucasian  female who has had a previous right hip surgery.  She presents today to  go back to the OR with Dr. Ollen Gross who performed a revision of her  right hip surgery.  The patient has requested that I proceed with a  closure and revision of her previous scar at the end of the procedure.   PROCEDURE:  The patient was lying in the lateral decubitus position with  the left side down.  Dr. Lequita Halt had completed the right hip procedure  and closed the deeper subcutaneous tissues.  I then proceeded with  closure of the wound.  The skin edges were then debrided to remove the  old scar and the area included where the scar had widened.  This was  removed to a full thickness through the soft tissues into the dermis.  Meticulous hemostasis was obtained.  The deeper subcutaneous tissue and  the dermal layer were closed using a 2-0 Monocryl suture.  The  superficial dermal layer was closed with 3-0 Monocryl suture.  The skin  was then closed with a 4-0 Monocryl and running intracuticular stitch.  At the proximal end of the incision, there was a divergence in the 2  incisions as she had  previous surgery.  I excised most of the old scar  except the one very small area in the proximal end of the scar.  This  was very well healed in and if I were to have to excise that part of the  incision, it would have meant lengthening the patient's scar.  Preoperatively, she had commented that she really wanted to have as  short a scar as possible and so I did not think that it would be  appropriate to do that at this time.  It was very well healed and should  continue to heal well for the patient.  The incision came together very  nicely without tension.  The incision was then dressed with Steri-Strips  followed by the rest of the dressing as determined by Dr. Lequita Halt.  The  patient was then take off the OR bed and placed onto a regular bed in  the supine position and then extubated.  There were no complications.  The patient tolerated the procedure well.  Final needle and sponge  counts were reported to be correct  at the end of the case.  She was then taken to recovery room in stable  condition.  The plan is for the patient to spend several days in the  hospital under Dr. Deri Fuelling care.  She would then be discharged home to  the care of her husband.  I will see the patient postoperatively once in  the hospital and then follow her up as needed on an outpatient basis.           ______________________________  Brantley Persons, M.D.     MC/MEDQ  D:  02/15/2009  T:  02/16/2009  Job:  [id number]

## 2010-12-01 NOTE — H&P (Signed)
[name], [name]               ACCOUNT NO.:  [id number]   MEDICAL RECORD NO.:  [id number]          PATIENT TYPE:  INP   LOCATION:  1607                         FACILITY:  [facility]   PHYSICIAN:  Leonides Grills, M.D.     DATE OF BIRTH:  1951-04-16   DATE OF ADMISSION:  01/20/2009  DATE OF DISCHARGE:                              HISTORY & PHYSICAL   CHIEF COMPLAINT:  Right hip dislocation.   HISTORY OF PRESENT ILLNESS:  Mrs. [name] is a 60 year old white female  with right hip pain.  She reports she was lying on the couch at home,  bended her right knee and her right hip popped.  She denies any other  injury.  She reports approximately 7 days of right proximal femur pain.  No known injury to the right distal femur.  She reports that she has had  difficulty bearing weight on the right leg due to the right proximal  femur pain.  The patient with a history of recent ORIF, right greater  trochanteric fracture/nonunion by Dr. Sherlean Young on January 07, 2009.  The  patient also had pneumonia on this most recent admission.  Last meal was  ice cream sandwich at approximately 1300 hours today.   PAST MEDICAL HISTORY:  1. Positive for migraines.  2. Anxiety.  3. Panic attack.  4. Depression.  5. Urinary incontinence.  6. Hypothyroidism.  7. 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  8. Fibromyalgia.  9. Chronic fatigue syndrome.  10.History of left hip staph infection.  11.Postmenopausal.   PAST SURGICAL HISTORY:  1. Neck fusion x2 and lumbar fusion.  2. Growth removed from one of her kidneys.  3. Carpal tunnel surgery.  4. Eye surgery x4.  5. Left total hip arthroplasty.  6. Right total hip arthroplasty in November 2009.  7. ORIF of right greater trochanteric fracture/nonunion by Dr. Sherlean Young      on January 07, 2009.   SOCIAL HISTORY:  She denies any smoking or drug abuse.  She has an  occasional alcoholic beverage.  She lives with her children.   ALLERGIES:  PENICILLIN causes hives.  She tolerates  Keflex.   MEDICATIONS:  1. Frova.  2. Synthroid.  3. Wellbutrin.  4. Topamax.  5. Dilaudid.  6. Vytorin.  7. Concerta.  8. Flexeril.  9. Provigil.  10.Versed.  11.Lexapro.  12.Robaxin.  13.Aspirin.   REVIEW OF SYSTEMS:  The patient reports pneumonia on last hospital  admission and right distal femur tenderness x7 days.  Migraines,  anxiety, panic attack, depression, urinary incontinence, hypothyroidism,  history of DVT without PE, fibromyalgia, chronic fatigue syndrome, left  hip history of staph infection, postmenopausal, otherwise negative or  noncontributory.   PHYSICAL EXAMINATION:  VITAL SIGNS:  Blood pressure 87/48, pulse 83,  respiratory rate 18, and she is 99% on nasal cannula.  CARDIAC:  Regular rate and rhythm.  No murmurs, rubs, or gallops.  CHEST:  Clear to auscultation bilaterally with no wheezing, rhonchi, or  rales.  ABDOMEN:  Nontender.  Positive bowel sounds x4 quadrants.  LOWER EXTREMITIES:  Dorsal pedal pulse 2+ bilaterally.  EHL and FHL  are  intact.  She has good sensation in both feet to light touch.  Right leg  shortened and externally rotated.  She has tenderness of the distal  right femur.  Otherwise no gross deformities.   RADIOGRAPHS:  Radiographs show right hip and AP pelvis shows a superior  dislocation of her right total hip arthroplasty without acute fracture.  The patient is to undergo bilateral total hip replacements with [name] cables on the right.   PLAN:  1. Plan is close reduction of right hip dislocation tonight.  2. Prior to surgery, we will obtain radiographs of the right distal      femur to rule out the fracture.      [name], P.A.      Leonides Grills, M.D.  Electronically Signed    GC/MEDQ  D:  01/20/2009  T:  01/21/2009  Job:  [id number]

## 2010-12-01 NOTE — Op Note (Signed)
[name], [name]               ACCOUNT NO.:  [id number]   MEDICAL RECORD NO.:  [id number]          PATIENT TYPE:  INP   LOCATION:  0098                         FACILITY:  [facility]   PHYSICIAN:  Ollen Gross, M.D.    DATE OF BIRTH:  1951/04/08   DATE OF PROCEDURE:  02/15/2009  DATE OF DISCHARGE:                               OPERATIVE REPORT   PREOPERATIVE DIAGNOSIS:  Unstable right total hip arthroplasty.   POSTOPERATIVE DIAGNOSIS:  Unstable right total hip arthroplasty.   PROCEDURE:  Right hip acetabular revision to constrain liner.   SURGEON:  Ollen Gross, M.D.   ASSISTANT:  Roma Schanz, P.A.   ANESTHESIA:  General.   ESTIMATED BLOOD LOSS:  200.   DRAINS:  Hemovac times 1.   COMPLICATIONS:  None.   CONDITION:  Stable to recovery.   CLINICAL NOTE:  [name] is a 60 year old female who had a right  total hip arthroplasty done last fall.  Did well initially and had a  dislocation episode in December.  She was noted to have a greater  trochanteric fracture and that was fixed in June.  Unfortunately she has  two subsequent dislocations.  It is felt that the abductor mechanism is  unstable and at this point we are going to convert her to a constrained  system.   PROCEDURE IN DETAIL:  After the successful administration of general  anesthetic the patient was placed in the left lateral decubitus position  with the right side up and held with the hip positioner.  Right lower  extremity was isolated from her perineum with plastic drapes and prepped  and draped in the usual sterile fashion.  Previous posterolateral  incisions were utilized.  Skin cut with 10 blade through subcutaneous  tissue to the fascia lata which was incised in line with the skin  incision.  Sciatic nerve was palpated and protected and the posterior  pseudocapsule is removed from the femur.  The previous fixation for the  greater trochanter had failed and the hardware which consisted of two  cables and a clamp was removed.  Retraction was performed of the  trochanteric fragment which was a very small fragment.  The hip was then  dislocated.  The bone tamp was used to remove the femoral head.  The  femur was then retracted anteriorly to gain acetabular exposure.   Acetabular retractors were placed.  The scar around the edges of the  acetabular components was removed to fully reveal the edges of the  component.  The extractor for the DePuy Pinnacle liner was placed and  the acetabular liner was removed.  The component was in excellent  anatomic position and was well fixed.  This is a 52 mm Pinnacle shell.  I placed the 32 mm constrained liner for the 52 shell and impacted that  and it was impacted and found to be stable.  The 32 plus zero femoral  head was then placed and the hip was reduced into the liner with the  snap fit.  The locking ring was then passed over the liner and impacted  and found  to be circumferentially reduced.  The hip was then placed  through a range of motion with outstanding stability.  She had full  extension, full external rotation, 70 degrees flexion, 40 degrees  adduction and 90 degrees internal rotation and 90 degrees of flexion and  70 degrees of internal rotation.  There was absolutely no impingement  throughout that range.  I was also able to flex her up to about 130  degrees without impingement.  The wound was then copiously irrigated  with saline solution.  I abducted the hip and attempted to reduce the  trochanteric fragment back to the femur.  It was felt that this fragment  was small and the bone quality was poor.  I thus excised the fragment  and it ended up being about a 1 x 2 cm fragment.  I then took the tendon  and advanced the tendon back to the femur through drill holes with #2  Ethibond suture.  This was felt to be a good stable repair.  The  posterior pseudocapsule was sewn into this also.  This created a stable  repair.  The wound  was further irrigated and the fascia lata closed over  a Hemovac drain with interrupted #1 Vicryl.  Deep subcu closed with #1  Vicryl.  Dr. Sherald Hess then came in and did a plastic surgery closure  of the wound and the superficial subcu and skin.  Steri-Strips were then  applied and a bulky sterile dressing applied.  I came back in and  transported the patient to recovery in stable condition.      Ollen Gross, M.D.  Electronically Signed     FA/MEDQ  D:  02/15/2009  T:  02/15/2009  Job:  [id number]
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
## 2010-12-04 NOTE — Discharge Summary (Signed)
NAMELEONARD, HENDLER               ACCOUNT NO.:  [id number]   MEDICAL RECORD NO.:  [id number]          PATIENT TYPE:  INP   LOCATION:  1603                         FACILITY:  [facility]   PHYSICIAN:  Ollen Gross, M.D.    DATE OF BIRTH:  30-Mar-1951   DATE OF ADMISSION:  05/22/2008  DATE OF DISCHARGE:  05/28/2008                               DISCHARGE SUMMARY   ADMITTING DIAGNOSES:  1. Osteoarthritis right hip.  2. Migraines.  3. Anxieties.  4. History of panic attacks.  5. Depression.  6. History of sleep apnea.  7. Past history of phlebitis.  8. Urinary incontinence.  9. Hypothyroidism.  10.History of urinary tract infections.  11.History of DVT (no PE).  12.Fibromyalgia.  13.Chronic fatigue syndrome.  14.History of postoperative staph infection.  15.Postmenopausal.  16.History of mumps.   DISCHARGE DIAGNOSES:  1. Osteoarthritis right hip status post right total replacement      arthroplasty.  2. Postoperative blood loss anemia, did not require transfusion.  3. Postoperative hyponatremia, improved.  4. Postoperative hypokalemia, improved.  5. Remaining discharge same as admitting.   PROCEDURE:  On May 22, 2008 right total hip.  Surgeon:  Dr. Lequita Halt.  Assistant:  Avel Peace PA-C.  Anesthesia:  General.   CONSULTATIONS:  Candyce Churn, M.D., Internal Medicine.   BRIEF HISTORY:  This is a 60 year old female with end-stage arthritis  right hip, progressive worsening pain and dysfunction who now presents  for total hip arthroplasty.   LABORATORY DATA:  Preop CBC showed a hemoglobin 1of 4.6, hematocrit  43.6, white cell count 7.5, platelets 290.  Postop hemoglobin 12.6, then  dropped down to 11.2.  Last known H and H 10.2 and 29.9.  PT/PTT preop  13.3 and 26 respectively.  INR 1.0.  Serial pro times were followed.  Last known PT/INR 32.6 and 2.9.  Chem panel on admission:  Elevated ALT  of 45.  Remaining Chem panel within normal limits.  Serial BMETs were  followed.  Sodium dropped from 142 to 134, back up to 135.  Potassium  dropped from  4.2 to 3.2, back up to 3.7.  Preop UA negative.  Blood  group type O+.  Hip film preop May 20, 2008:  Marked right hip  degenerative changes as above.  Prior left hip replacement.  Portable  pelvis and hip film May 22, 2008:  Good position and alignment right  hip arthroplasty.  __________ preop dated June 26, 2008:  No  obstructive of free air. EKG dated May 20, 2008:  Sinus rhythm,  occasional ectopic ventricular beat.  ST depression, nonspecific T-wave  abnormality.  Nondiagnostic.  Possible digitalis effect.  Subendocardial  injury ischemia, unconfirmed.   HOSPITAL COURSE:  The patient was admitted to [facility],  tolerated the procedure well, later transferred to the recovery room and  orthopedic floor.  Started on PCA and p.o. analgesics.  Had a fair  amount of pain following surgery, not much rest.  She was seen in  consultation by Dr. Kevan Ny postoperatively.  The patient was well-known  to him.  He followed along to  assist with medical management.  Did not  get much rest on the evening of surgery.  Dong a little bit better on  day one.  We used low-dose Ativan for panic attacks and also agitation,  insomnia.  She was placed partial weightbearing.  Had excellent urinary  output.  Hemoglobin looked good.  Postop started getting up out of bed  on day one.  Had a little low grade temperature but encouraged incentive  spirometer.  By day two hip was sore, started getting up with therapy.  Had a rough night on that second night after surgery.  Had a little bit  of low potassium and sodium, felt to be dilutional component.  Put her  on a little mild low-dose potassium and DC'd the fluids and potassium  and sodium came back up.  Had excellent urinary output so was diuresing  postop fluids well.  INR had already come up to 2.4, was placed on  Lovenox with a history of DVT for  Lovenox bridge, but DC'd Lovenox since  the INR was therapeutic.  By day three doing a little bit better.  Dressing changed on day two and day three.  Incision was healing well.  Slowly progressing with physical therapy, only walking about 12 feet but  then later got up to about 40 feet and maintained her partial  weightbearing.  She stayed through the hospital throughout the weekend.  She unfortunately slipped and fell one day, slipped slowly down to the  floor, did not complain of any obvious pain and the leg lengths looked  good.  Leg lengths were equal, had minimal drainage from the incision  but no dehiscence of the wound.  She was instructed not to get up  without help, slowly progressing.  Did stay in the hospital through the  weekend and continued to progress slowly with physical therapy.  By  postop day five, pain was a little bit better.  INR was 2.8.  Continued  to require assistance, only walking about 30 feet. However, by the  following day of May 28, 2008 the patient was doing much better.  Pain was under good control.  She was tolerating her medicines and was  discharged home at that time.   DISCHARGE/PLAN:  1. Patient discharged home on May 28, 2008.  2. Discharge diagnoses.  Please see above.  3. Discharge medications:  Dilaudid, Robaxin, Coumadin.   DIET:  Heart-healthy diet.   ACTIVITY:  Partial weightbearing 25-50% right leg.  Hip precautions:  Total hip protocol.  May start showering, do not submerge incision under  water.   DISPOSITION:  Home.   CONDITION ON DISCHARGE:  Improved.      [name], P.A.C.      Ollen Gross, M.D.  Electronically Signed    ALP/MEDQ  D:  07/03/2008  T:  07/03/2008  Job:  [id number]   cc:   Candyce Churn, M.D.  Fax: [phone]

## 2011-03-26 DIAGNOSIS — T84099A Other mechanical complication of unspecified internal joint prosthesis, initial encounter: Secondary | ICD-10-CM | POA: Insufficient documentation

## 2011-03-26 DIAGNOSIS — Z86718 Personal history of other venous thrombosis and embolism: Secondary | ICD-10-CM | POA: Insufficient documentation

## 2011-03-26 DIAGNOSIS — Z8669 Personal history of other diseases of the nervous system and sense organs: Secondary | ICD-10-CM | POA: Insufficient documentation

## 2011-03-26 DIAGNOSIS — M797 Fibromyalgia: Secondary | ICD-10-CM | POA: Insufficient documentation

## 2011-03-26 DIAGNOSIS — F329 Major depressive disorder, single episode, unspecified: Secondary | ICD-10-CM | POA: Insufficient documentation

## 2011-03-26 DIAGNOSIS — F909 Attention-deficit hyperactivity disorder, unspecified type: Secondary | ICD-10-CM | POA: Insufficient documentation

## 2011-03-26 DIAGNOSIS — F32A Depression, unspecified: Secondary | ICD-10-CM | POA: Insufficient documentation

## 2011-03-26 DIAGNOSIS — K225 Diverticulum of esophagus, acquired: Secondary | ICD-10-CM | POA: Insufficient documentation

## 2011-03-26 DIAGNOSIS — J309 Allergic rhinitis, unspecified: Secondary | ICD-10-CM | POA: Insufficient documentation

## 2011-03-26 DIAGNOSIS — E039 Hypothyroidism, unspecified: Secondary | ICD-10-CM | POA: Insufficient documentation

## 2011-03-26 DIAGNOSIS — K219 Gastro-esophageal reflux disease without esophagitis: Secondary | ICD-10-CM | POA: Insufficient documentation

## 2011-04-20 LAB — CBC
HCT: 29.9 — ABNORMAL LOW
HCT: 32.7 — ABNORMAL LOW
HCT: 37.2
HCT: 43.6
Hemoglobin: 10.2 — ABNORMAL LOW
Hemoglobin: 11.2 — ABNORMAL LOW
Hemoglobin: 12.6
Hemoglobin: 14.6
MCHC: 33.4
MCHC: 33.7
MCHC: 34.1
MCHC: 34.2
MCV: 93
MCV: 93.8
MCV: 94.1
MCV: 94.2
Platelets: 208
Platelets: 215
Platelets: 219
Platelets: 290
RBC: 3.18 — ABNORMAL LOW
RBC: 3.52 — ABNORMAL LOW
RBC: 3.95
RBC: 4.63
RDW: 12.8
RDW: 12.9
RDW: 13
RDW: 13.5
WBC: 11 — ABNORMAL HIGH
WBC: 11.1 — ABNORMAL HIGH
WBC: 13.8 — ABNORMAL HIGH
WBC: 7.5

## 2011-04-20 LAB — BASIC METABOLIC PANEL
BUN: 11
BUN: 12
BUN: 7
CO2: 24
CO2: 25
CO2: 25
Calcium: 8.2 — ABNORMAL LOW
Calcium: 8.3 — ABNORMAL LOW
Calcium: 8.7
Chloride: 103
Chloride: 107
Chloride: 107
Creatinine, Ser: 0.82
Creatinine, Ser: 0.89
Creatinine, Ser: 1.08
GFR calc Af Amer: >60
GFR calc Af Amer: >60
GFR calc Af Amer: >60
GFR calc non Af Amer: 52 — ABNORMAL LOW
GFR calc non Af Amer: >60
GFR calc non Af Amer: >60
Glucose, Bld: 120 — ABNORMAL HIGH
Glucose, Bld: 136 — ABNORMAL HIGH
Glucose, Bld: 142 — ABNORMAL HIGH
Potassium: 3.2 — ABNORMAL LOW
Potassium: 3.7
Potassium: 4.3
Sodium: 134 — ABNORMAL LOW
Sodium: 135
Sodium: 139

## 2011-04-20 LAB — PROTIME-INR
INR: 1
INR: 1
INR: 2.4 — ABNORMAL HIGH
INR: 2.7 — ABNORMAL HIGH
INR: 2.8 — ABNORMAL HIGH
INR: 2.9 — ABNORMAL HIGH
INR: 4.1 — ABNORMAL HIGH
Prothrombin Time: 13.2
Prothrombin Time: 13.7
Prothrombin Time: 28.1 — ABNORMAL HIGH
Prothrombin Time: 30.4 — ABNORMAL HIGH
Prothrombin Time: 31.4 — ABNORMAL HIGH
Prothrombin Time: 32.6 — ABNORMAL HIGH
Prothrombin Time: 43.4 — ABNORMAL HIGH

## 2011-04-20 LAB — URINALYSIS, ROUTINE W REFLEX MICROSCOPIC
Bilirubin Urine: NEGATIVE
Glucose, UA: NEGATIVE
Hgb urine dipstick: NEGATIVE
Ketones, ur: NEGATIVE
Nitrite: NEGATIVE
Protein, ur: NEGATIVE
Specific Gravity, Urine: 1.013
Urobilinogen, UA: 0.2
pH: 5.5

## 2011-04-20 LAB — TYPE AND SCREEN
ABO/RH(D): O POS
Antibody Screen: NEGATIVE

## 2011-04-20 LAB — COMPREHENSIVE METABOLIC PANEL
ALT: 45 — ABNORMAL HIGH
AST: 25
Albumin: 4
Alkaline Phosphatase: 110
BUN: 21
CO2: 28
Calcium: 9
Chloride: 108
Creatinine, Ser: 1.01
GFR calc Af Amer: >60
GFR calc non Af Amer: 56 — ABNORMAL LOW
Glucose, Bld: 116 — ABNORMAL HIGH
Potassium: 4.2
Sodium: 142
Total Bilirubin: 0.3
Total Protein: 6.6

## 2011-04-20 LAB — APTT: aPTT: 26

## 2011-04-20 LAB — ABO/RH: ABO/RH(D): O POS

## 2011-05-20 ENCOUNTER — Telehealth: Payer: Self-pay | Admitting: *Deleted

## 2011-05-20 NOTE — Telephone Encounter (Signed)
She was wheeled in in a wheelchair by a female. She had a bag containing a stool sample that she wanted to have done here & wanted to see "Tim".  She is not a pt here. States her dr told her to see "TIM" explained we could not handle her specimen & md would have to speak with one of the mds here & send records before she could be given an appt. She was tearful & told me about the flukes in her stool & her twin 62 year old boys, one of which is special needs. Asked Tomasita Morrow, RN to speak with her.   She encouraged her to contact her md so he can make a referral & told her to take the sample to the lab across the lobby.

## 2011-07-08 ENCOUNTER — Other Ambulatory Visit: Payer: Self-pay | Admitting: Internal Medicine

## 2011-07-08 DIAGNOSIS — M25552 Pain in left hip: Secondary | ICD-10-CM

## 2011-07-09 ENCOUNTER — Other Ambulatory Visit: Payer: BC Managed Care – HMO

## 2011-08-10 ENCOUNTER — Other Ambulatory Visit: Payer: BC Managed Care – HMO

## 2011-08-31 ENCOUNTER — Other Ambulatory Visit (HOSPITAL_COMMUNITY)
Admission: RE | Admit: 2011-08-31 | Discharge: 2011-08-31 | Disposition: A | Discharge status: Home / Self Care | Payer: PRIVATE HEALTH INSURANCE | Source: Ambulatory Visit | Attending: Obstetrics and Gynecology | Admitting: Obstetrics and Gynecology

## 2011-08-31 ENCOUNTER — Other Ambulatory Visit: Payer: Self-pay | Admitting: Obstetrics and Gynecology

## 2011-08-31 DIAGNOSIS — Z01419 Encounter for gynecological examination (general) (routine) without abnormal findings: Secondary | ICD-10-CM | POA: Insufficient documentation

## 2011-08-31 DIAGNOSIS — Z1159 Encounter for screening for other viral diseases: Secondary | ICD-10-CM | POA: Insufficient documentation

## 2011-09-02 ENCOUNTER — Other Ambulatory Visit: Payer: Self-pay | Admitting: Obstetrics and Gynecology

## 2011-09-02 DIAGNOSIS — Z1231 Encounter for screening mammogram for malignant neoplasm of breast: Secondary | ICD-10-CM

## 2011-09-09 ENCOUNTER — Ambulatory Visit
Admission: RE | Admit: 2011-09-09 | Discharge: 2011-09-09 | Disposition: A | Discharge status: Home / Self Care | Payer: PRIVATE HEALTH INSURANCE | Source: Ambulatory Visit | Attending: Obstetrics and Gynecology | Admitting: Obstetrics and Gynecology

## 2011-09-09 DIAGNOSIS — Z1231 Encounter for screening mammogram for malignant neoplasm of breast: Secondary | ICD-10-CM

## 2011-10-01 ENCOUNTER — Ambulatory Visit (INDEPENDENT_AMBULATORY_CARE_PROVIDER_SITE_OTHER): Payer: PRIVATE HEALTH INSURANCE | Admitting: Family Medicine

## 2011-10-01 ENCOUNTER — Other Ambulatory Visit: Payer: Self-pay | Admitting: Family Medicine

## 2011-10-01 VITALS — BP 121/64 | HR 82 | Temp 98.9°F | Resp 16

## 2011-10-01 DIAGNOSIS — D649 Anemia, unspecified: Secondary | ICD-10-CM

## 2011-10-01 DIAGNOSIS — F22 Delusional disorders: Secondary | ICD-10-CM

## 2011-10-01 DIAGNOSIS — F909 Attention-deficit hyperactivity disorder, unspecified type: Secondary | ICD-10-CM

## 2011-10-01 DIAGNOSIS — F40298 Other specified phobia: Secondary | ICD-10-CM

## 2011-10-01 LAB — POCT CBC
Granulocyte percent: 63.5 %G (ref 37–80)
HCT, POC: 37.7 % (ref 37.7–47.9)
Hemoglobin: 11.8 g/dL — AB (ref 12.2–16.2)
Lymph, poc: 2 (ref 0.6–3.4)
MCH, POC: 26.6 pg — AB (ref 27–31.2)
MCHC: 31.3 g/dL — AB (ref 31.8–35.4)
MCV: 84.9 fL (ref 80–97)
MID (cbc): 0.4 (ref 0–0.9)
MPV: 7.2 fL (ref 0–99.8)
POC Granulocyte: 4.2 (ref 2–6.9)
POC LYMPH PERCENT: 30.8 %L (ref 10–50)
POC MID %: 5.7 %M (ref 0–12)
Platelet Count, POC: 368 10*3/uL (ref 142–424)
RBC: 4.44 M/uL (ref 4.04–5.48)
RDW, POC: 17.5 %
WBC: 6.6 10*3/uL (ref 4.6–10.2)

## 2011-10-01 MED ORDER — DEXMETHYLPHENIDATE HCL 5 MG PO TABS: ORAL_TABLET | ORAL | Status: DC

## 2011-10-01 MED ORDER — DEXMETHYLPHENIDATE HCL ER 10 MG PO CP24: ORAL_CAPSULE | ORAL | Status: DC

## 2011-10-01 NOTE — Progress Notes (Signed)
Patient Name: [name] Date of Birth: [birth year] Medical Record Number: [id number] Gender: female Date of Encounter: 10/01/2011  History of Present Illness:  [name] is a 61 y.o. very  female patient who presents with the following: She is a new patient to our clinic today. She has an extensive medical history.  She recently decided to leave her doctor of 13 years because she feels that she contracted a parasite in [place] a few years ago and which is causing "fluid pockets under her skin." She was upset that her old PCP sent her to a GI doctor who did not seem to believe that she has this parasitic infection.  She also has had multiple bilateral hip operations for AVN.  However, the main issue today is her feeling that she has "worms coming out of her face and eyes" for which she uses saline drops "to kill them." She is also concerned about fluid pockets under her skin- these seem to be concentrated on her legs and buttocks.  She notes that these pockets cause her to have a lot of pain with sitting and walking.  She has arm- brace type crutches but today requested to be brought back in a WC.  She is also on several medications for ADD and depression including large doses of focalin, as well as wellbutrin and lexapro.  She is very frustrated that no- one seems to be able to cure her parasitic infection.  She has multiple photos that she shared with me- to me they appear to be normal skin but she is convinced that the photos show "worms" under and in her skin, as well as other unusul skin patterns.  She also points out the skin on her legs and backside as being full of fluid pockets.  (it appears consistent with normal skin for a woman of her age.)   She also states that she has had a fever between 99 and 100.4 degrees for 2 years.    I was able to touch base with the ID clinic at [facility] where she has been seen in the past- they diagnosed her with delusional parasitosis.     There is no problem list on file for this patient.  Past Medical History  Diagnosis Date  . Anemia   . Chronic kidney disease   . Thyroid disease    Past Surgical History  Procedure Date  . Avascular necrosis of the hip     multiple operations starting in 2005.  bilateral hips   History  Substance Use Topics  . Smoking status: Never Smoker   . Smokeless tobacco: Never Used  . Alcohol Use: Yes     occasional   Family History  Problem Relation Age of Onset  . Cancer Sister    Allergies  Allergen Reactions  . Doxycycline Nausea And Vomiting  . Penicillins Hives    Medication list has been reviewed and updated.  Review of Systems: As per HPI- otherwise negative.  Physical Examination: Filed Vitals:   10/01/11 1012  BP: 121/64  Pulse: 82  Temp: 98.9 F (37.2 C)  TempSrc: Oral  Resp: 16    There is no height or weight on file to calculate BMI.  GEN: WDWN, NAD, Non-toxic, A & O x 3 HEENT: Atraumatic, Normocephalic. Neck supple. No masses, No LAD.  TM, oropharynx and eyes wnl- no lesions, redness or swelling.  Of note patient points out the "worms and eggs" she sees in the back of  her throat.   Ears and Nose: No external deformity. CV: RRR, No M/G/R. No JVD. No thrill. No extra heart sounds. PULM: CTA B, no wheezes, crackles, rhonchi. No retractions. No resp. distress. No accessory muscle use. ABD: S, NT, ND, +BS. No rebound. No HSM. EXTR: No c/c/e NEURO uses crutches to walk and has a bent over, antalgic gait Skin: no lesions or other abnormalities visible except for surgical scars from her hip procedures.   PSYCH: Normally interactive. Conversant. Not depressed or anxious appearing.  Calm demeanor.    Assessment and Plan: 1. ADD (attention deficit disorder with hyperactivity)  dexmethylphenidate (FOCALIN) 5 MG tablet, dexmethylphenidate (FOCALIN XR) 10 MG 24 hr capsule  2. Anemia  POCT CBC, Ferritin, Basic metabolic panel  3. 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  I do think that she would benefit from a psychiatric evaluation.  However, she is not very receptive to this idea. She became quite upset that I was not able to see the worms and other unusual phenomena in her photos.  Antipsychotic treatment would likely be the most helpful thing for her at this point.  Per the ID specialist at [facility], further "treatment" and consolations with ID specialists etc tends to make these delusions worse instead of better, and psychiatric therapy is usually the most helpful.    I did do a few labs that [name] requested, and will follow- up pending the results.  Also I did refill her focalin as she states that she will start coming to see [place] as her PCP.  However, we decided to decrease her dose and I noted she was on quite a bit of the medication.  She had been taking focalin immediate release, 5mg , 3 tablets BID as well as focalin XL 10mg  3 tablets BID.  We have decreased this to just 2 of the 5mg  tabs BID and 2 of the 10mg  tabs BID.  I spent over an hour in direct face to face care and also in coordination of care (calling her other doctors, psychologist, etc).

## 2011-10-02 ENCOUNTER — Encounter: Payer: Self-pay | Admitting: Family Medicine

## 2011-10-02 LAB — BASIC METABOLIC PANEL
BUN: 23 mg/dL (ref 6–23)
CO2: 24 mEq/L (ref 19–32)
Calcium: 8.8 mg/dL (ref 8.4–10.5)
Chloride: 109 mEq/L (ref 96–112)
Creat: 0.86 mg/dL (ref 0.50–1.10)
Glucose, Bld: 82 mg/dL (ref 70–99)
Potassium: 4.2 mEq/L (ref 3.5–5.3)
Sodium: 140 mEq/L (ref 135–145)

## 2011-10-02 LAB — IGG 1, 2, 3, AND 4
IgG Subclass 1: 560 mg/dL (ref 405.0–1011.0)
IgG Subclass 2: 245 mg/dL (ref 169.0–786.0)
IgG Subclass 3: 19.8 mg/dL (ref 11.0–85.0)
IgG Subclass 4: 67.9 mg/dL (ref 3.0–201.0)
IgG Total IGGSUB: 820 mg/dL (ref 690–1700)

## 2011-10-02 LAB — FERRITIN: Ferritin: 19 ng/mL (ref 10–291)

## 2011-10-11 ENCOUNTER — Ambulatory Visit (INDEPENDENT_AMBULATORY_CARE_PROVIDER_SITE_OTHER): Payer: PRIVATE HEALTH INSURANCE | Admitting: Internal Medicine

## 2011-10-11 ENCOUNTER — Encounter: Payer: Self-pay | Admitting: Internal Medicine

## 2011-10-11 VITALS — BP 138/92 | HR 96 | Temp 99.2°F | Resp 24

## 2011-10-11 DIAGNOSIS — R69 Illness, unspecified: Secondary | ICD-10-CM

## 2011-10-11 DIAGNOSIS — IMO0001 Reserved for inherently not codable concepts without codable children: Secondary | ICD-10-CM

## 2011-10-11 NOTE — Progress Notes (Signed)
  Subjective:    Patient ID: [name], female    DOB: [birth year], 61 y.o.   MRN: [id number]  HPI After interacting about 1 hour decided I would not be the best provider for her.   Review of Systems     Objective:   Physical Exam        Assessment & Plan:  Going to another practise.

## 2011-11-01 ENCOUNTER — Other Ambulatory Visit: Payer: Self-pay | Admitting: Orthopaedic Surgery

## 2011-11-01 DIAGNOSIS — M25551 Pain in right hip: Secondary | ICD-10-CM

## 2011-11-04 ENCOUNTER — Inpatient Hospital Stay: Admission: RE | Admit: 2011-11-04 | Payer: PRIVATE HEALTH INSURANCE | Source: Ambulatory Visit

## 2011-11-11 ENCOUNTER — Encounter: Payer: Self-pay | Admitting: Gastroenterology

## 2011-11-12 ENCOUNTER — Telehealth: Payer: Self-pay | Admitting: Gastroenterology

## 2011-11-12 NOTE — Telephone Encounter (Signed)
Received copies from  [facility],on 11/12/11 . Forwarded 11 pages to Dr. Arlyce Dice ,for review.

## 2011-11-15 ENCOUNTER — Telehealth (INDEPENDENT_AMBULATORY_CARE_PROVIDER_SITE_OTHER): Payer: Self-pay | Admitting: Surgery

## 2011-11-15 ENCOUNTER — Ambulatory Visit
Admission: RE | Admit: 2011-11-15 | Discharge: 2011-11-15 | Disposition: A | Discharge status: Home / Self Care | Payer: PRIVATE HEALTH INSURANCE | Source: Ambulatory Visit | Attending: Orthopaedic Surgery | Admitting: Orthopaedic Surgery

## 2011-11-15 DIAGNOSIS — M25551 Pain in right hip: Secondary | ICD-10-CM

## 2011-11-15 NOTE — Telephone Encounter (Signed)
I informed Stanton Kidney that this is Dr Thayer Ohm Blackman's pt not Dr Carman Ching.  She will call the pt

## 2011-11-18 ENCOUNTER — Ambulatory Visit (AMBULATORY_SURGERY_CENTER): Payer: PRIVATE HEALTH INSURANCE | Admitting: *Deleted

## 2011-11-18 ENCOUNTER — Encounter: Payer: Self-pay | Admitting: Gastroenterology

## 2011-11-18 VITALS — Ht 62.5 in | Wt 134.2 lb

## 2011-11-18 DIAGNOSIS — Z1211 Encounter for screening for malignant neoplasm of colon: Secondary | ICD-10-CM

## 2011-11-18 MED ORDER — PEG-KCL-NACL-NASULF-NA ASC-C 100 G PO SOLR: ORAL | Status: DC

## 2011-11-25 ENCOUNTER — Encounter: Payer: Self-pay | Admitting: Gastroenterology

## 2011-11-25 ENCOUNTER — Encounter: Payer: Self-pay | Admitting: Nurse Practitioner

## 2011-11-25 ENCOUNTER — Telehealth: Payer: Self-pay | Admitting: Family Medicine

## 2011-11-25 ENCOUNTER — Telehealth: Payer: Self-pay | Admitting: Gastroenterology

## 2011-11-25 ENCOUNTER — Ambulatory Visit (INDEPENDENT_AMBULATORY_CARE_PROVIDER_SITE_OTHER): Payer: PRIVATE HEALTH INSURANCE | Admitting: Nurse Practitioner

## 2011-11-25 VITALS — BP 120/76 | HR 88 | Ht 62.0 in | Wt 134.0 lb

## 2011-11-25 DIAGNOSIS — R198 Other specified symptoms and signs involving the digestive system and abdomen: Secondary | ICD-10-CM

## 2011-11-25 DIAGNOSIS — F22 Delusional disorders: Secondary | ICD-10-CM

## 2011-11-25 DIAGNOSIS — R195 Other fecal abnormalities: Secondary | ICD-10-CM

## 2011-11-25 DIAGNOSIS — F40298 Other specified phobia: Secondary | ICD-10-CM

## 2011-11-25 NOTE — Telephone Encounter (Signed)
Error

## 2011-11-25 NOTE — Patient Instructions (Signed)
Please go to the basement level to have your labs drawn.  

## 2011-11-25 NOTE — Telephone Encounter (Signed)
Pt states she has a parasitic infection that has gone untreated for over a year. States she is running a fever and has worms in her stool. States that she really needs to be seen asap. Pt has colon scheduled with Dr. Arlyce Dice for the 16th but feels she needs to be seen sooner. Pt knows a CRNA here in the office, she talked to Dr. Christella Hartigan regarding the pt. Called Dr. Christella Hartigan and he states she can be seen by a midlevel if she wants to be seen. Pt scheduled to see Willette Cluster NP today at 2:30pm. Pt aware of appt date and time.

## 2011-11-26 ENCOUNTER — Encounter: Payer: Self-pay | Admitting: Nurse Practitioner

## 2011-11-26 DIAGNOSIS — R195 Other fecal abnormalities: Secondary | ICD-10-CM | POA: Insufficient documentation

## 2011-11-26 DIAGNOSIS — F22 Delusional disorders: Secondary | ICD-10-CM | POA: Insufficient documentation

## 2011-11-26 NOTE — Progress Notes (Signed)
11/26/2011 [name] [id number] 1950/08/01   HISTORY OF PRESENT ILLNESS: Patient is a 61 year old female who is scheduled for a screening colonoscopy but wanted to be seen today for evaluation of a possible parasite infection. Patient feels she contracted a parasitic infection last October while in [place]. She brings in dozens of photos on her cell phone showing what she feels are flukes in her stool. She has dozens of pictures of her vagina and extremities where she also feels parasites and eggs are residing. After lighting scrubbing herself with a brillo pad she was able to removed some of the worms / eggs from beneath her skin. Patient has read extensively about parasites, their life cycle, treatment etc. She has collected (in baggies) many of the different types of parasites of which she is infected with. She has spoken with someone in [place] ( ?maybe at [facility]) about her infestation.  Her stools are not diarrheal in nature. She has no abdominal pain. No abnormal weight loss.    Past Medical History  Diagnosis Date  . Anemia   . Chronic kidney disease   . Thyroid disease   . Chronic pain   . Fibromyalgia    Past Surgical History  Procedure Date  . Avascular necrosis of the hip     multiple operations starting in 2005.  bilateral hips  . Total hip arthroplasty   . Kidney surgery 1971    kidney reconstruction  . Appendectomy 1974  . Cervical fusion [id number]  . Laminectomy 1985  . Back fusion 1999    reports that she has never smoked. She has never used smokeless tobacco. She reports that she drinks alcohol. She reports that she does not use illicit drugs. family history includes Cancer in her sister.  There is no history of Colon cancer and Stomach cancer. Allergies  Allergen Reactions  . Doxycycline Nausea And Vomiting  . Penicillins Hives  . Sulfa Antibiotics     Per pt: unknown      Outpatient Encounter Prescriptions as of 11/25/2011  Medication Sig Dispense  Refill  . buPROPion (WELLBUTRIN XL) 150 MG 24 hr tablet Take 150 mg by mouth daily.      Marland Kitchen buPROPion (WELLBUTRIN XL) 300 MG 24 hr tablet Take 300 mg by mouth daily.      . cyclobenzaprine (FLEXERIL) 10 MG tablet Take 10 mg by mouth once.      Marland Kitchen dexmethylphenidate (FOCALIN XR) 10 MG 24 hr capsule Take 2 by mouth twice daily  120 capsule  0  . dexmethylphenidate (FOCALIN) 5 MG tablet Take 2 tablets twice daily  120 tablet  0  . diclofenac (VOLTAREN) 50 MG EC tablet Take 50 mg by mouth 2 (two) times daily.      Marland Kitchen escitalopram (LEXAPRO) 20 MG tablet Take 20 mg by mouth 3 (three) times daily.       . frovatriptan (FROVA) 2.5 MG tablet Take 2.5 mg by mouth as needed. If recurs, may repeat after 2 hours. Max of 3 tabs in 24 hours.      Marland Kitchen HYDROmorphone (DILAUDID) 4 MG tablet Take 4 mg by mouth every 6 (six) hours as needed.      Marland Kitchen l-methylfolate-B6-B12 (METANX) 3-35-2 MG TABS Take 1 tablet by mouth daily. 7.5 mg      . levothyroxine (SYNTHROID, LEVOTHROID) 125 MCG tablet Take 125 mcg by mouth daily.      . methocarbamol (ROBAXIN) 500 MG tablet Take 500 mg by mouth once.      Marland Kitchen  naproxen sodium (ANAPROX) 220 MG tablet Take 220 mg by mouth 2 (two) times daily with a meal.      . topiramate (TOPAMAX) 100 MG tablet Take 100 mg by mouth 2 (two) times daily.      Marland Kitchen zolpidem (AMBIEN) 5 MG tablet Take 5 mg by mouth at bedtime as needed.      . peg 3350 powder (MOVIPREP) 100 G SOLR moviprep as directed  1 kit  0  . DISCONTD: HYDROcodone-acetaminophen (NORCO) 10-325 MG per tablet Take 1 tablet by mouth every 6 (six) hours as needed.         REVIEW OF SYSTEMS  : All other systems reviewed and negative except where noted in the History of Present Illness.   PHYSICAL EXAM: BP 120/76  Pulse 88  Ht 5\' 2"  (1.575 m)  Wt 134 lb (60.782 kg)  BMI 24.51 kg/m2 General: Well developed white female in no acute distress Head: Normocephalic and atraumatic Eyes:  sclerae anicteric,conjunctive pink. Ears: Normal  auditory acuity Mouth: No deformity or lesions Neck: Supple, no masses.  Lungs: Clear throughout to auscultation Heart: Regular rate and rhythm; no murmurs heard Abdomen: Soft, non distended, nontender. No masses or hepatomegaly noted. Normal Bowel sounds Rectal: no external lesions. Light brown stool without any unusual contents Musculoskeletal: Symmetrical with no gross deformities  Skin: No lesions on visible extremities Extremities: No edema or deformities noted Neurological: Alert oriented, grossly nonfocal Cervical Nodes:  No significant cervical adenopathy Psychological:  Alert and cooperative. Normal mood and affect  ASSESSMENT AND PLAN;   1. Abnormal contents in stool- I didn't see anything unusual on rectal exam or anywhere else. Patient showed me several areas around her vagina and on her extremities where she feels parasites / eggs are present. She had dozens of cell phone pictures of her stool, vagina, eyes, and other parts of her body where she feels parasites and eggs have infested. I read her PCP's note from March and it appears patient was worked up by infectious disease at [facility]. 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 This patient's life seems to be consumed with the belief that she is infested with parasites. I chose not to antagonize her as I don't think she can be convinced otherwise in her current state of mind. I will check stool for O&P but don't think there is really much more I can offer. This pleasant patient s desperate for help. I tried to call who I believe may be her psychologist but got no answer. I left a message for her PCP, Dr. Dallas Schimke, to see about getting patient to psychiatrist. I will follow up on this.  2. Colon cancer screening. She is already scheduled for a colonoscopy  3. ADD, on medication

## 2011-11-27 NOTE — Progress Notes (Signed)
Agree with Ms. Guenther's assessment and plan. Avanti Jetter E. Shady Bradish, MD, FACG   

## 2011-11-29 ENCOUNTER — Telehealth: Payer: Self-pay | Admitting: Family Medicine

## 2011-11-29 NOTE — Telephone Encounter (Signed)
Message copied by Pearline Cables on Mon Nov 29, 2011  2:06 PM ------      Message from: Stan Head E      Created: Mon Nov 29, 2011  8:14 AM      Regarding: delusional parasitosis, can we get her to psychiatrist?       This lady has seen Gunnar Fusi twice and last time showed photos where she is abrading her mucous membranes with a Brillo pad.            Know you have been through it already but any chance you/we can taka a stab at getting her to psychiatrist.            I have yet to meet her but she will be seeing me for a screening colonoscopy            Thanks            My cell is [phone]

## 2011-11-29 NOTE — Telephone Encounter (Signed)
Called and discussed with Gunnar Fusi- I have only seen this patient once but as far as I can tell she has delusional parasitosis. My best advice is to encourage her to see a psychiatrist

## 2011-11-29 NOTE — Telephone Encounter (Signed)
Called Dr. Leone Payor back and discussed

## 2011-11-29 NOTE — Telephone Encounter (Signed)
Message copied by Pearline Cables on Mon Nov 29, 2011  2:10 PM ------      Message from: Stan Head E      Created: Mon Nov 29, 2011  8:14 AM      Regarding: delusional parasitosis, can we get her to psychiatrist?       This lady has seen Gunnar Fusi twice and last time showed photos where she is abrading her mucous membranes with a Brillo pad.            Know you have been through it already but any chance you/we can taka a stab at getting her to psychiatrist.            I have yet to meet her but she will be seeing me for a screening colonoscopy            Thanks            My cell is [phone]

## 2011-12-02 ENCOUNTER — Ambulatory Visit (AMBULATORY_SURGERY_CENTER): Payer: PRIVATE HEALTH INSURANCE | Admitting: Gastroenterology

## 2011-12-02 ENCOUNTER — Encounter: Payer: Self-pay | Admitting: Gastroenterology

## 2011-12-02 VITALS — BP 140/48 | HR 84 | Temp 98.4°F | Resp 18 | Ht 62.0 in | Wt 134.0 lb

## 2011-12-02 DIAGNOSIS — Z1211 Encounter for screening for malignant neoplasm of colon: Secondary | ICD-10-CM

## 2011-12-02 MED ORDER — SODIUM CHLORIDE 0.9 % IV SOLN: 500.0000 mL | INTRAVENOUS | Status: DC

## 2011-12-02 NOTE — Patient Instructions (Signed)
YOU HAD AN ENDOSCOPIC PROCEDURE TODAY AT THE [facility]: Refer to the procedure report that was given to you for any specific questions about what was found during the examination.  If the procedure report does not answer your questions, please call your gastroenterologist to clarify.  If you requested that your care partner not be given the details of your procedure findings, then the procedure report has been included in a sealed envelope for you to review at your convenience later.  YOU SHOULD EXPECT: Some feelings of bloating in the abdomen. Passage of more gas than usual.  Walking can help get rid of the air that was put into your GI tract during the procedure and reduce the bloating. If you had a lower endoscopy (such as a colonoscopy or flexible sigmoidoscopy) you may notice spotting of blood in your stool or on the toilet paper. If you underwent a bowel prep for your procedure, then you may not have a normal bowel movement for a few days.  DIET: Your first meal following the procedure should be a light meal and then it is ok to progress to your normal diet.  A half-sandwich or bowl of soup is an example of a good first meal.  Heavy or fried foods are harder to digest and may make you feel nauseous or bloated.  Likewise meals heavy in dairy and vegetables can cause extra gas to form and this can also increase the bloating.  Drink plenty of fluids but you should avoid alcoholic beverages for 24 hours.  ACTIVITY: Your care partner should take you home directly after the procedure.  You should plan to take it easy, moving slowly for the rest of the day.  You can resume normal activity the day after the procedure however you should NOT DRIVE or use heavy machinery for 24 hours (because of the sedation medicines used during the test).    SYMPTOMS TO REPORT IMMEDIATELY: A gastroenterologist can be reached at any hour.  During normal business hours, 8:30 AM to 5:00 PM Monday through Friday,  call [phone].  After hours and on weekends, please call the GI answering service at [phone] who will take a message and have the physician on call contact you.   Following lower endoscopy (colonoscopy or flexible sigmoidoscopy):  Excessive amounts of blood in the stool  Significant tenderness or worsening of abdominal pains  Swelling of the abdomen that is new, acute  Fever of 100F or higher    FOLLOW UP: If any biopsies were taken you will be contacted by phone or by letter within the next 1-3 weeks.  Call your gastroenterologist if you have not heard about the biopsies in 3 weeks.  Our staff will call the home number listed on your records the next business day following your procedure to check on you and address any questions or concerns that you may have at that time regarding the information given to you following your procedure. This is a courtesy call and so if there is no answer at the home number and we have not heard from you through the emergency physician on call, we will assume that you have returned to your regular daily activities without incident.  SIGNATURES/CONFIDENTIALITY: You and/or your care partner have signed paperwork which will be entered into your electronic medical record.  These signatures attest to the fact that that the information above on your After Visit Summary has been reviewed and is understood.  Full responsibility of the confidentiality   of this discharge information lies with you and/or your care-partner.     

## 2011-12-02 NOTE — Op Note (Signed)
[facility] [address] [place], [place]  [address]  COLONOSCOPY PROCEDURE REPORT  PATIENT:  Rebekah, Young  MR#:  [id number] BIRTHDATE:  24-Oct-1950, 60 yrs. old  GENDER:  female ENDOSCOPIST:  Barbette Hair. Arlyce Dice, MD REF. BY:  Abbe Amsterdam, M.D. PROCEDURE DATE:  12/02/2011 PROCEDURE:  Diagnostic Colonoscopy ASA CLASS:  Class II INDICATIONS:  Routine Risk Screening MEDICATIONS:   MAC sedation, administered by CRNA propofol 140mg IV  DESCRIPTION OF PROCEDURE:   After the risks benefits and alternatives of the procedure were thoroughly explained, informed consent was obtained.  Digital rectal exam was performed and revealed no abnormalities.   The LB CF-[id number] endoscope was introduced through the anus and advanced to the cecum, which was identified by the ileocecal valve, limited by poor preparation.  Moderate amount of retained liquid stool  The quality of the prep was Moviprep fair.  The instrument was then slowly withdrawn as the colon was fully examined. <<PROCEDUREIMAGES>>  FINDINGS:  Scattered diverticula were found in the sigmoid colon (see image1).  This was otherwise a normal examination of the colon (see image2, image4, image5, and image7).   Retroflexed views in the rectum revealed no abnormalities.    The time to cecum =  1) 4.25  minutes. The scope was then withdrawn in  1) 7.75  minutes from the cecum and the procedure completed. COMPLICATIONS:  None ENDOSCOPIC IMPRESSION: 1) Diverticula, scattered in the sigmoid colon 2) Otherwise normal examination RECOMMENDATIONS: 1) Continue current colorectal screening recommendations for "routine risk" patients with a repeat colonoscopy in 10 years. REPEAT EXAM:  In 10 year(s) for Colonoscopy.  ______________________________ Barbette Hair. Arlyce Dice, MD  CC:  n. eSIGNED:   Barbette Hair. Jenisse Vullo at 12/02/2011 10:05 AM  Mindi Slicker, [phone]

## 2011-12-02 NOTE — Progress Notes (Signed)
PROPOFOL PER J NULTY CRNA. SEE SCANNED INTRA PROCEDURE REPORT. EWM 

## 2011-12-02 NOTE — Progress Notes (Signed)
Patient did not experience any of the following events: a burn prior to discharge; a fall within the facility; wrong site/side/patient/procedure/implant event; or a hospital transfer or hospital admission upon discharge from the facility. (G8907) Patient did not have preoperative order for IV antibiotic SSI prophylaxis. (G8918)  

## 2011-12-03 ENCOUNTER — Telehealth: Payer: Self-pay | Admitting: *Deleted

## 2011-12-03 NOTE — Telephone Encounter (Signed)
  Follow up Call-  Call back number 12/02/2011  Post procedure Call Back phone  # [phone] or [phone]  Permission to leave phone message Yes     [facility]

## 2012-03-24 ENCOUNTER — Emergency Department (HOSPITAL_COMMUNITY)
Admission: EM | Admit: 2012-03-24 | Discharge: 2012-03-24 | Disposition: A | Discharge status: Home / Self Care | Payer: PRIVATE HEALTH INSURANCE | Attending: Emergency Medicine | Admitting: Emergency Medicine

## 2012-03-24 ENCOUNTER — Encounter (HOSPITAL_COMMUNITY): Payer: Self-pay | Admitting: *Deleted

## 2012-03-24 DIAGNOSIS — IMO0001 Reserved for inherently not codable concepts without codable children: Secondary | ICD-10-CM | POA: Insufficient documentation

## 2012-03-24 DIAGNOSIS — E079 Disorder of thyroid, unspecified: Secondary | ICD-10-CM | POA: Insufficient documentation

## 2012-03-24 DIAGNOSIS — F40298 Other specified phobia: Secondary | ICD-10-CM | POA: Insufficient documentation

## 2012-03-24 DIAGNOSIS — F22 Delusional disorders: Secondary | ICD-10-CM

## 2012-03-24 DIAGNOSIS — Z981 Arthrodesis status: Secondary | ICD-10-CM | POA: Insufficient documentation

## 2012-03-24 DIAGNOSIS — Z880 Allergy status to penicillin: Secondary | ICD-10-CM | POA: Insufficient documentation

## 2012-03-24 DIAGNOSIS — N189 Chronic kidney disease, unspecified: Secondary | ICD-10-CM | POA: Insufficient documentation

## 2012-03-24 DIAGNOSIS — Z882 Allergy status to sulfonamides status: Secondary | ICD-10-CM | POA: Insufficient documentation

## 2012-03-24 NOTE — ED Notes (Addendum)
Pt reports possible infection "on top of my skull".  Pt states that she has a "parasitic infection for a while but nobody will believe me coz nobody really wants to say that."  Pt also claims to have "eggs" under her skin and her scalp.  Pt states "this needs to come out" reports that her scalp on top of her head feels "hot and soft."  Pt reports taking a picture of her head as well.   Pt also reports having 6 hip surgery within 9 month period-reports Dr. Julius Bowels at [facility] had fixed her hip recently and is not feels better.

## 2012-03-25 NOTE — ED Provider Notes (Signed)
History     CSN: [id number]  Arrival date & time 03/24/12  1640   First MD Initiated Contact with Patient 03/24/12 1918      Chief Complaint  Patient presents with  . Neck Pain    (Consider location/radiation/quality/duration/timing/severity/associated sxs/prior treatment) Patient is a 61 y.o. female presenting with rash.  Rash  This is a chronic problem. The current episode started more than 1 week ago. The problem has been gradually worsening. There has been no fever. The pain is mild. The pain has been fluctuating since onset.  Patient reports to ED claiming to be infested with parasites.  Review of records indicate multiple evaluations for same complaint by PCP and other specialties.  Patient has been diagnosed with delusional parasitosis.   Past Medical History  Diagnosis Date  . Anemia   . Chronic kidney disease   . Thyroid disease   . Chronic pain   . Fibromyalgia     Past Surgical History  Procedure Date  . Avascular necrosis of the hip     multiple operations starting in 2005.  bilateral hips  . Total hip arthroplasty   . Kidney surgery 1971    kidney reconstruction  . Appendectomy 1974  . Cervical fusion [id number]  . Laminectomy 1985  . Back fusion 1999    Family History  Problem Relation Age of Onset  . Cancer Sister   . Colon cancer Neg Hx   . Stomach cancer Neg Hx     History  Substance Use Topics  . Smoking status: Never Smoker   . Smokeless tobacco: Never Used  . Alcohol Use: Yes     occasional    OB History    Grav Para Term Preterm Abortions TAB SAB Ect Mult Living                  Review of Systems  Skin: Positive for rash.  All other systems reviewed and are negative.    Allergies  Doxycycline; Penicillins; and Sulfa antibiotics  Home Medications   Current Outpatient Rx  Name Route Sig Dispense Refill  . ALPRAZOLAM 0.25 MG PO TABS Oral Take 0.25 mg by mouth at bedtime as needed. Sleep.    . BUPROPION HCL ER (XL) 150 MG PO  TB24 Oral Take 150 mg by mouth daily. With 300mg  XL tablet.    . BUPROPION HCL ER (XL) 300 MG PO TB24 Oral Take 300 mg by mouth daily. With 150mg  XL tablet.    Marland Kitchen DEXMETHYLPHENIDATE HCL ER 10 MG PO CP24 Oral Take 30 mg by mouth 2 (two) times daily. In the morning and in the afternoon.    Marland Kitchen DEXMETHYLPHENIDATE HCL 5 MG PO TABS Oral Take 15 mg by mouth 2 (two) times daily. In the morning and in the afternoon.    Marland Kitchen DICLOFENAC SODIUM 50 MG PO TBEC Oral Take 100 mg by mouth every morning.     Marland Kitchen ESCITALOPRAM OXALATE 20 MG PO TABS Oral Take 60 mg by mouth daily.     . FROVATRIPTAN SUCCINATE 2.5 MG PO TABS Oral Take 2.5 mg by mouth as needed. If recurs, may repeat after 2 hours. Max of 3 tabs in 24 hours.    . DEPLIN 7.5 MG PO TABS Oral Take 1 tablet by mouth every morning.    Marland Kitchen LEVOTHYROXINE SODIUM 125 MCG PO TABS Oral Take 125 mcg by mouth daily.    Marland Kitchen METHOCARBAMOL 500 MG PO TABS Oral Take 500 mg by mouth every morning.     Marland Kitchen  TOPIRAMATE 100 MG PO TABS Oral Take 100 mg by mouth daily.       BP 129/84  Pulse 92  Temp 99 F (37.2 C)  Resp 20  SpO2 98%  Physical Exam  Nursing note and vitals reviewed. Constitutional: She is oriented to person, place, and time. She appears well-developed and well-nourished.  HENT:  Head: Normocephalic and atraumatic.  Eyes: Conjunctivae are normal. Pupils are equal, round, and reactive to light.  Neck: Normal range of motion. Neck supple.  Cardiovascular: Normal rate, regular rhythm and normal heart sounds.   Pulmonary/Chest: Effort normal and breath sounds normal.  Abdominal: Soft. Bowel sounds are normal.  Musculoskeletal: Normal range of motion. She exhibits no edema and no tenderness.  Lymphadenopathy:    She has no cervical adenopathy.  Neurological: She is alert and oriented to person, place, and time.  Skin: Skin is warm and dry.  Psychiatric: Her speech is normal. Her mood appears anxious. She is agitated. Cognition and memory are normal.    ED  Course  Procedures (including critical care time)  Labs Reviewed - No data to display No results found.   1. Ekbom's delusional parasitosis     Patient presents numerous photos on her cell phone that she is convinced show parasites under her skin in multiple locations on her body.  Patient weaves an extensive story of reported exposure to multiple parasitic organisms.  Multiple evaluations by various providers have not corroborated patient's story.  MDM          Jimmye Norman, NP 03/25/12 0134  Jimmye Norman, NP 03/25/12 [phone]

## 2012-03-27 NOTE — ED Provider Notes (Signed)
Medical screening examination/treatment/procedure(s) were performed by non-physician practitioner and as supervising physician I was immediately available for consultation/collaboration.  Derwood Kaplan, MD 03/27/12 [phone]

## 2012-03-29 ENCOUNTER — Other Ambulatory Visit: Payer: Self-pay | Admitting: Internal Medicine

## 2012-03-29 DIAGNOSIS — E039 Hypothyroidism, unspecified: Secondary | ICD-10-CM

## 2012-04-03 ENCOUNTER — Ambulatory Visit
Admission: RE | Admit: 2012-04-03 | Discharge: 2012-04-03 | Disposition: A | Discharge status: Home / Self Care | Payer: PRIVATE HEALTH INSURANCE | Source: Ambulatory Visit | Attending: Internal Medicine | Admitting: Internal Medicine

## 2012-04-03 DIAGNOSIS — E039 Hypothyroidism, unspecified: Secondary | ICD-10-CM

## 2012-04-03 MED ORDER — GADOBENATE DIMEGLUMINE 529 MG/ML IV SOLN
11.0000 mL | Freq: Once | INTRAVENOUS | Status: AC | PRN
  Administered 2012-04-03: 11 mL via INTRAVENOUS

## 2012-07-18 ENCOUNTER — Other Ambulatory Visit: Payer: Self-pay | Admitting: Obstetrics and Gynecology

## 2012-07-18 DIAGNOSIS — R928 Other abnormal and inconclusive findings on diagnostic imaging of breast: Secondary | ICD-10-CM

## 2012-08-22 ENCOUNTER — Other Ambulatory Visit: Payer: Self-pay | Admitting: Orthopaedic Surgery

## 2012-08-22 DIAGNOSIS — M25611 Stiffness of right shoulder, not elsewhere classified: Secondary | ICD-10-CM

## 2012-08-22 DIAGNOSIS — M25511 Pain in right shoulder: Secondary | ICD-10-CM

## 2012-08-22 DIAGNOSIS — R609 Edema, unspecified: Secondary | ICD-10-CM

## 2012-08-27 ENCOUNTER — Ambulatory Visit
Admission: RE | Admit: 2012-08-27 | Discharge: 2012-08-27 | Disposition: A | Discharge status: Home / Self Care | Payer: PRIVATE HEALTH INSURANCE | Source: Ambulatory Visit | Attending: Orthopaedic Surgery | Admitting: Orthopaedic Surgery

## 2012-08-27 DIAGNOSIS — M25511 Pain in right shoulder: Secondary | ICD-10-CM

## 2012-08-27 DIAGNOSIS — M25611 Stiffness of right shoulder, not elsewhere classified: Secondary | ICD-10-CM

## 2012-08-27 DIAGNOSIS — R609 Edema, unspecified: Secondary | ICD-10-CM

## 2012-09-15 NOTE — Telephone Encounter (Signed)
error 

## 2013-08-04 ENCOUNTER — Emergency Department (HOSPITAL_COMMUNITY)
Admission: EM | Admit: 2013-08-04 | Discharge: 2013-08-05 | Disposition: A | Discharge status: Home / Self Care | Payer: BC Managed Care – PPO | Attending: Emergency Medicine | Admitting: Emergency Medicine

## 2013-08-04 ENCOUNTER — Encounter (HOSPITAL_COMMUNITY): Payer: Self-pay | Admitting: Emergency Medicine

## 2013-08-04 ENCOUNTER — Emergency Department (INDEPENDENT_AMBULATORY_CARE_PROVIDER_SITE_OTHER): Payer: BC Managed Care – PPO

## 2013-08-04 ENCOUNTER — Emergency Department (HOSPITAL_COMMUNITY)
Admission: EM | Admit: 2013-08-04 | Discharge: 2013-08-04 | Disposition: A | Discharge status: Other Acute Inpatient Hospital | Payer: BC Managed Care – PPO | Source: Home / Self Care

## 2013-08-04 ENCOUNTER — Emergency Department (HOSPITAL_COMMUNITY): Payer: BC Managed Care – PPO

## 2013-08-04 DIAGNOSIS — R1013 Epigastric pain: Secondary | ICD-10-CM | POA: Insufficient documentation

## 2013-08-04 DIAGNOSIS — Z791 Long term (current) use of non-steroidal anti-inflammatories (NSAID): Secondary | ICD-10-CM | POA: Insufficient documentation

## 2013-08-04 DIAGNOSIS — R109 Unspecified abdominal pain: Secondary | ICD-10-CM

## 2013-08-04 DIAGNOSIS — R3919 Other difficulties with micturition: Secondary | ICD-10-CM | POA: Insufficient documentation

## 2013-08-04 DIAGNOSIS — R142 Eructation: Secondary | ICD-10-CM | POA: Insufficient documentation

## 2013-08-04 DIAGNOSIS — Z79899 Other long term (current) drug therapy: Secondary | ICD-10-CM | POA: Insufficient documentation

## 2013-08-04 DIAGNOSIS — R143 Flatulence: Secondary | ICD-10-CM | POA: Insufficient documentation

## 2013-08-04 DIAGNOSIS — K5900 Constipation, unspecified: Secondary | ICD-10-CM

## 2013-08-04 DIAGNOSIS — R21 Rash and other nonspecific skin eruption: Secondary | ICD-10-CM | POA: Insufficient documentation

## 2013-08-04 DIAGNOSIS — K219 Gastro-esophageal reflux disease without esophagitis: Secondary | ICD-10-CM | POA: Insufficient documentation

## 2013-08-04 DIAGNOSIS — E079 Disorder of thyroid, unspecified: Secondary | ICD-10-CM | POA: Insufficient documentation

## 2013-08-04 DIAGNOSIS — R112 Nausea with vomiting, unspecified: Secondary | ICD-10-CM | POA: Insufficient documentation

## 2013-08-04 DIAGNOSIS — N189 Chronic kidney disease, unspecified: Secondary | ICD-10-CM | POA: Insufficient documentation

## 2013-08-04 DIAGNOSIS — Z862 Personal history of diseases of the blood and blood-forming organs and certain disorders involving the immune mechanism: Secondary | ICD-10-CM | POA: Insufficient documentation

## 2013-08-04 DIAGNOSIS — E860 Dehydration: Secondary | ICD-10-CM | POA: Insufficient documentation

## 2013-08-04 DIAGNOSIS — Z9889 Other specified postprocedural states: Secondary | ICD-10-CM | POA: Insufficient documentation

## 2013-08-04 DIAGNOSIS — Z8739 Personal history of other diseases of the musculoskeletal system and connective tissue: Secondary | ICD-10-CM | POA: Insufficient documentation

## 2013-08-04 DIAGNOSIS — F411 Generalized anxiety disorder: Secondary | ICD-10-CM | POA: Insufficient documentation

## 2013-08-04 DIAGNOSIS — Z880 Allergy status to penicillin: Secondary | ICD-10-CM | POA: Insufficient documentation

## 2013-08-04 DIAGNOSIS — G8929 Other chronic pain: Secondary | ICD-10-CM | POA: Insufficient documentation

## 2013-08-04 DIAGNOSIS — R141 Gas pain: Secondary | ICD-10-CM | POA: Insufficient documentation

## 2013-08-04 LAB — BASIC METABOLIC PANEL
BUN: 21 mg/dL (ref 6–23)
CO2: 21 mEq/L (ref 19–32)
Calcium: 8.9 mg/dL (ref 8.4–10.5)
Chloride: 106 mEq/L (ref 96–112)
Creatinine, Ser: 0.88 mg/dL (ref 0.50–1.10)
GFR calc Af Amer: 79 mL/min — ABNORMAL LOW (ref >90)
GFR calc non Af Amer: 68 mL/min — ABNORMAL LOW (ref >90)
Glucose, Bld: 88 mg/dL (ref 70–99)
Potassium: 4.1 mEq/L (ref 3.7–5.3)
Sodium: 140 mEq/L (ref 137–147)

## 2013-08-04 LAB — CBC
HCT: 42.5 % (ref 36.0–46.0)
Hemoglobin: 14.7 g/dL (ref 12.0–15.0)
MCH: 31.2 pg (ref 26.0–34.0)
MCHC: 34.6 g/dL (ref 30.0–36.0)
MCV: 90.2 fL (ref 78.0–100.0)
Platelets: 294 10*3/uL (ref 150–400)
RBC: 4.71 MIL/uL (ref 3.87–5.11)
RDW: 13 % (ref 11.5–15.5)
WBC: 8.4 10*3/uL (ref 4.0–10.5)

## 2013-08-04 LAB — POCT URINALYSIS DIP (DEVICE)
Bilirubin Urine: NEGATIVE
Glucose, UA: NEGATIVE mg/dL
Hgb urine dipstick: NEGATIVE
Ketones, ur: NEGATIVE mg/dL
Leukocytes, UA: NEGATIVE
Nitrite: NEGATIVE
Protein, ur: NEGATIVE mg/dL
Specific Gravity, Urine: 1.025 (ref 1.005–1.030)
Urobilinogen, UA: 0.2 mg/dL (ref 0.0–1.0)
pH: 7 (ref 5.0–8.0)

## 2013-08-04 LAB — HEPATIC FUNCTION PANEL
ALT: 22 U/L (ref 0–35)
AST: 19 U/L (ref 0–37)
Albumin: 3.9 g/dL (ref 3.5–5.2)
Alkaline Phosphatase: 96 U/L (ref 39–117)
Bilirubin, Direct: <0.2 mg/dL (ref 0.0–0.3)
Total Bilirubin: 0.3 mg/dL (ref 0.3–1.2)
Total Protein: 7.1 g/dL (ref 6.0–8.3)

## 2013-08-04 LAB — POCT I-STAT TROPONIN I: Troponin i, poc: 0 ng/mL (ref 0.00–0.08)

## 2013-08-04 LAB — LIPASE, BLOOD: Lipase: 23 U/L (ref 11–59)

## 2013-08-04 MED ORDER — MILK AND MOLASSES ENEMA
1.0000 | Freq: Once | RECTAL | Status: DC
  Filled 2013-08-04: qty 250

## 2013-08-04 MED ORDER — ONDANSETRON HCL 4 MG PO TABS: 4.0000 mg | ORAL_TABLET | Freq: Four times a day (QID) | ORAL | Status: DC

## 2013-08-04 MED ORDER — ACETAMINOPHEN 325 MG PO TABS
650.0000 mg | ORAL_TABLET | Freq: Once | ORAL | Status: AC
  Administered 2013-08-04: 650 mg via ORAL
  Filled 2013-08-04: qty 2

## 2013-08-04 MED ORDER — ONDANSETRON 4 MG PO TBDP
8.0000 mg | ORAL_TABLET | Freq: Once | ORAL | Status: AC
  Administered 2013-08-04: 8 mg via ORAL
  Filled 2013-08-04: qty 2

## 2013-08-04 MED ORDER — SODIUM CHLORIDE 0.9 % IV BOLUS (SEPSIS)
1000.0000 mL | Freq: Once | INTRAVENOUS | Status: AC
  Administered 2013-08-04: 1000 mL via INTRAVENOUS

## 2013-08-04 MED ORDER — FLEET ENEMA 7-19 GM/118ML RE ENEM
1.0000 | ENEMA | Freq: Once | RECTAL | Status: AC
  Administered 2013-08-04: 1 via RECTAL
  Filled 2013-08-04: qty 1

## 2013-08-04 NOTE — ED Notes (Signed)
Pt asked could she obtain a urine sample and pt stated that she was dehydrated and couldn't go at the moment. Will follow up shortly.

## 2013-08-04 NOTE — ED Provider Notes (Signed)
Medical screening examination/treatment/procedure(s) were performed by non-physician practitioner and as supervising physician I was immediately available for consultation/collaboration.  Leslee Homeavid Cj Edgell, M.D.  Reuben Likesavid C Drelyn Pistilli, MD 08/04/13 2221

## 2013-08-04 NOTE — ED Notes (Signed)
  Spoke with McGeheeRia in main lab, states she will add on lipase and hepatic function to blood samples sent down.

## 2013-08-04 NOTE — ED Notes (Addendum)
Received pt from urgent care for further eval of no BM x 2 weeks, nausea, GERD, right upper abdominal pain that radiates to right flank area. Pt recently diagnosed with pneumonia and was treated with levaquin.
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
## 2013-08-04 NOTE — Discharge Instructions (Signed)
Abdominal Pain, Women °Abdominal (stomach, pelvic, or belly) pain can be caused by many things. It is important to tell your doctor: °· The location of the pain. °· Does it come and go or is it present all the time? °· Are there things that start the pain (eating certain foods, exercise)? °· Are there other symptoms associated with the pain (fever, nausea, vomiting, diarrhea)? °All of this is helpful to know when trying to find the cause of the pain. °CAUSES  °· Stomach: virus or bacteria infection, or ulcer. °· Intestine: appendicitis (inflamed appendix), regional ileitis (Crohn's disease), ulcerative colitis (inflamed colon), irritable bowel syndrome, diverticulitis (inflamed diverticulum of the colon), or cancer of the stomach or intestine. °· Gallbladder disease or stones in the gallbladder. °· Kidney disease, kidney stones, or infection. °· Pancreas infection or cancer. °· Fibromyalgia (pain disorder). °· Diseases of the female organs: °· Uterus: fibroid (non-cancerous) tumors or infection. °· Fallopian tubes: infection or tubal pregnancy. °· Ovary: cysts or tumors. °· Pelvic adhesions (scar tissue). °· Endometriosis (uterus lining tissue growing in the pelvis and on the pelvic organs). °· Pelvic congestion syndrome (female organs filling up with blood just before the menstrual period). °· Pain with the menstrual period. °· Pain with ovulation (producing an egg). °· Pain with an IUD (intrauterine device, birth control) in the uterus. °· Cancer of the female organs. °· Functional pain (pain not caused by a disease, may improve without treatment). °· Psychological pain. °· Depression. °DIAGNOSIS  °Your doctor will decide the seriousness of your pain by doing an examination. °· Blood tests. °· X-rays. °· Ultrasound. °· CT scan (computed tomography, special type of X-ray). °· MRI (magnetic resonance imaging). °· Cultures, for infection. °· Barium enema (dye inserted in the large intestine, to better view it with  X-rays). °· Colonoscopy (looking in intestine with a lighted tube). °· Laparoscopy (minor surgery, looking in abdomen with a lighted tube). °· Major abdominal exploratory surgery (looking in abdomen with a large incision). °TREATMENT  °The treatment will depend on the cause of the pain.  °· Many cases can be observed and treated at home. °· Over-the-counter medicines recommended by your caregiver. °· Prescription medicine. °· Antibiotics, for infection. °· Birth control pills, for painful periods or for ovulation pain. °· Hormone treatment, for endometriosis. °· Nerve blocking injections. °· Physical therapy. °· Antidepressants. °· Counseling with a psychologist or psychiatrist. °· Minor or major surgery. °HOME CARE INSTRUCTIONS  °· Do not take laxatives, unless directed by your caregiver. °· Take over-the-counter pain medicine only if ordered by your caregiver. Do not take aspirin because it can cause an upset stomach or bleeding. °· Try a clear liquid diet (broth or water) as ordered by your caregiver. Slowly move to a bland diet, as tolerated, if the pain is related to the stomach or intestine. °· Have a thermometer and take your temperature several times a day, and record it. °· Bed rest and sleep, if it helps the pain. °· Avoid sexual intercourse, if it causes pain. °· Avoid stressful situations. °· Keep your follow-up appointments and tests, as your caregiver orders. °· If the pain does not go away with medicine or surgery, you may try: °· Acupuncture. °· Relaxation exercises (yoga, meditation). °· Group therapy. °· Counseling. °SEEK MEDICAL CARE IF:  °· You notice certain foods cause stomach pain. °· Your home care treatment is not helping your pain. °· You need stronger pain medicine. °· You want your IUD removed. °· You feel faint or   lightheaded.  You develop nausea and vomiting.  You develop a rash.  You are having side effects or an allergy to your medicine. SEEK IMMEDIATE MEDICAL CARE IF:   Your  pain does not go away or gets worse.  You have a fever.  Your pain is felt only in portions of the abdomen. The right side could possibly be appendicitis. The left lower portion of the abdomen could be colitis or diverticulitis.  You are passing blood in your stools (bright red or black tarry stools, with or without vomiting).  You have blood in your urine.  You develop chills, with or without a fever.  You pass out. MAKE SURE YOU:   Understand these instructions.  Will watch your condition.  Will get help right away if you are not doing well or get worse. Document Released: 05/02/2007 Document Revised: 09/27/2011 Document Reviewed: 05/22/2009 [facility] Patient Information 2014 [place], [place].  Constipation, Adult Constipation is when a person:  Poops (bowel movement) less than 3 times a week.  Has a hard time pooping.  Has poop that is dry, hard, or bigger than normal. HOME CARE   Eat more fiber, such as fruits, vegetables, whole grains like brown rice, and beans.  Eat less fatty foods and sugar. This includes [place] fries, hamburgers, cookies, candy, and soda.  If you are not getting enough fiber from food, take products with added fiber in them (supplements).  Drink enough fluid to keep your pee (urine) clear or pale yellow.  Go to the restroom when you feel like you need to poop. Do not hold it.  Only take medicine as told by your doctor. Do not take medicines that help you poop (laxatives) without talking to your doctor first.  Exercise on a regular basis, or as told by your doctor. GET HELP RIGHT AWAY IF:   You have bright red blood in your poop (stool).  Your constipation lasts more than 4 days or gets worse.  You have belly (abdomen) or butt (rectal) pain.  You have thin poop (as thin as a pencil).  You lose weight, and it cannot be explained. MAKE SURE YOU:   Understand these instructions.  Will watch your condition.  Will get help right away if  you are not doing well or get worse. Document Released: 12/22/2007 Document Revised: 09/27/2011 Document Reviewed: 04/16/2013 [facility] Patient Information 2014 [place], [place].   Emergency Department Resource Guide 1) Find a Doctor and Pay Out of Pocket Although you won't have to find out who is covered by your insurance plan, it is a good idea to ask around and get recommendations. You will then need to call the office and see if the doctor you have chosen will accept you as a new patient and what types of options they offer for patients who are self-pay. Some doctors offer discounts or will set up payment plans for their patients who do not have insurance, but you will need to ask so you aren't surprised when you get to your appointment.  2) Contact Your Local Health Department Not all health departments have doctors that can see patients for sick visits, but many do, so it is worth a call to see if yours does. If you don't know where your local health department is, you can check in your phone book. The CDC also has a tool to help you locate your state's health department, and many state websites also have listings of all of their local health departments.  3) Find  a Walk-in Clinic If your illness is not likely to be very severe or complicated, you may want to try a walk in clinic. These are popping up all over the country in pharmacies, drugstores, and shopping centers. They're usually staffed by nurse practitioners or physician assistants that have been trained to treat common illnesses and complaints. They're usually fairly quick and inexpensive. However, if you have serious medical issues or chronic medical problems, these are probably not your best option.  No Primary Care Doctor: - Call Health Connect at  [phone] - they can help you locate a primary care doctor that  accepts your insurance, provides certain services, etc. - Physician Referral Service- [phone]  Chronic Pain  Problems: Organization         Address  Phone   Notes  [facility]  [phone]) [phone] Patients need to be referred by their primary care doctor.   Medication Assistance: Organization         Address  Phone   Notes  [facility] [address] [place], [place] [address] [phone] --Must be a resident of [facility] [place] -- Must have NO insurance coverage whatsoever (no Medicaid/ Medicare, etc.) -- The pt. MUST have a primary care doctor that directs their care regularly and follows them in the community   Med[facility]ist  [phone]) [phone]   [employer]  [phone]    Agencies that provide inexpensive medical care: Organization         Address  Phone   Notes  [facility]  [phone](336) [phone]   [facility]    [phone]) [phone]   [facility] [address] [place], [place] [address] [phone]) [phone]   [facility] [address], [place] [phone]) [phone]   Planned Parenthood    [phone]) [phone]   [facility]    [phone]) [phone]   [facility] and [facility]  [address], [place] Phone:  [phone], Fax:  [phone] Hours of Operation:  9 am - 6 pm, M-F.  Also accepts Medicaid/Medicare and self-pay.  [facility]  [address], [place] Phone: [phone]) [phone], Fax: [phone]) [phone]. Hours of Operation:  8:30 am - 5:30 pm, M-F.  Also accepts Medicaid and self-pay.  [facility] [place] [address], [place] Phone: [phone]) [phone]   [facility] [address], [place], [place] [phone], Ext. [phone] Mondays & Thursdays: 7-9 AM.  First 15 patients are seen on a first come, first serve basis.    Medicaid-accepting [facility] Providers:  Organization         Address  Phone   Notes  [facility] [address], [place] [phone]) [phone] Also  accepts self-pay patients.  [facility] [address], [place]  [phone]) [phone]   [facility] [address], [place] [phone]) [phone]   [facility] [address] [place] [address], [place] [phone]) [phone]   Renaye RakersVeita Bland [address], [place]   [phone]) [phone] Only accepts [place] Access [place] patients after they have their name applied to their card.   Self-Pay (no insurance) in [facility]:  Organization         Address  Phone   Notes  Sickle Cell Patients, [facility] [address]  [place], [place] [phone]   [facility] [address] [place], [place] [phone]   [facility] [place]  [address],  [phone]   [facility]/Dr. Osei-Bonsu  [address], [place] or [address], [place] [phone] Phone number for both [place] and [place] locations is the same.  [facility] [address] [place] [phone]   [facility] [address] [place] or [address] [phone]   [facility] [address], [place] [phone], phone; [phone], fax Sees patients 1st and 3rd Saturday of every month.  Must not qualify for public or private insurance (i.e. Medicaid, Medicare, [place] Health Choice, Veterans' Benefits)  Household income should be no more than 200% of the poverty level The clinic cannot treat you if you are pregnant or think you are pregnant  Sexually transmitted diseases are not treated at the clinic.    Dental Care: Organization         Address  Phone  Notes  [facility] Department of [facility] [address] [place], [place] [phone] Accepts children up to age 22 who are enrolled in [place] or [place] Health Choice; pregnant  women with a Medicaid card; and children who have applied for Medicaid or [place] Health Choice, but were declined, whose parents can pay a reduced fee at time of service.  [facility] Department of [facility]  [address], [place] [phone] Accepts children up to age 79 who are enrolled in [place] or [place] Health Choice; pregnant women with a Medicaid card; and children who have applied for Medicaid or [place] Health Choice, but were declined, whose parents can pay a reduced fee at time of service.  [facility]  [address] [place], [place] [phone] Patients are seen by appointment only. Walk-ins are not accepted. [facility] will see patients 44 years of age and older. Monday - Tuesday (8am-5pm) Most Wednesdays (8:30-5pm) $30 per visit, cash only  [facility]  [address], [facility] [phone] Patients are seen by appointment only. Walk-ins are not accepted. [facility] will see patients 106 years of age and older. One Wednesday Evening (Monthly: Volunteer Based).  $30 per visit, cash only  [employer]  [phone] for adults; Children under age 31, call Graduate Pediatric Dentistry at [phone]. Children aged 79-14, please call [phone] to request a pediatric application.  Dental services are provided in all areas of dental care including fillings, crowns and bridges, complete and partial dentures, implants, gum treatment, root canals, and extractions. Preventive care is also provided. Treatment is provided to both adults and children. Patients are selected via a lottery and there is often a waiting list.   [facility] [address] [place]  [phone] [email or web address]   [facility] [address] [place], [place] [phone], Ext. [phone] Second and Fourth Thursday of each month, opens at 6:30 AM; Clinic ends at 9 AM.  Patients are  seen on a first-come first-served basis, and a limited number are seen during each clinic.   [facility]  [address] [place] [place], [place] [phone]   Eligibility Requirements You must have lived in [place], [place], or  [place] counties for at least the last three months.   You cannot be eligible for state or federal sponsored [place], including [employer], [place], or [employer].   You generally cannot be eligible for healthcare insurance through your employer.    How to apply: Eligibility screenings are held every Tuesday and Wednesday afternoon from 1:00 pm until 4:00 pm. You do not need an appointment for the interview!  [facility] [address] [place], [place] [phone]   [facility] Health Department  [phone]   [facility] Health Department  [phone]   [facility] Health Department  [phone]    Behavioral Health Resources in the Community: Intensive Outpatient Programs Organization         Address  Phone  Notes  [facility] Services [address], [place], [place] [phone]   [facility]patient [address] [place], [place] [phone]   [facility] [address], [place], [place]  [phone]   [facility] Mental Health [address]  [place], [place] [phone] or [phone]   Substance Abuse Resources Organization         Address  Phone  Notes  Al[facility] Drug Services  [phone]   [facility]  [phone]   [facility] [place]  [phone]   Floydene Flock  [phone]   Residential & Outpatient Substance Abuse Program  [phone]   Psychological Services Organization         Address  Phone  Notes  [facility] Behavioral Health  [phone]   [facility]  [phone]   [facility] [address], [place] [phone] or [phone]    Mobile Crisis  Teams Organization         Address  Phone  Notes  [facility], Mobile Crisis Care Unit  [phone]   [facility]  [address] [place], [place] [phone]   Doristine Locks [address] [place] [phone]    Self-Help/Support Groups Organization         Address  Phone             Notes  [facility] [place] - variety of support groups  336- I7437963 Call for more information  Narcotics Anonymous (NA), [facility] [address], [employer] [place]  2 meetings at this location   Statistician         Address  Phone  Notes  [facility] [address],    [place]  [phone]   [facility]  [address] [place] [address], [place], [place] [phone]   [facility] Treatment Facility [address] [place], [place] [phone] Admissions: 8am-3pm M-F  [facility] [address]    [place], [place] [phone]   [facility] [address] [place], [place], [place] [phone]   [facility] [address]  [place], [place] [phone]   [facility] - Intensive Outpatient [address], [place], [place] [phone]   [facility] ([facility]iction [facility] [address] [place].,  [place], [place] [phone] or [phone]   [facility] (RTS) [address] [place], [place] [phone] Accepts Medicaid  [facility] [address]  [place] [phone] Substance Abuse/Addiction Treatment   [facility] Resources Organization         Address  Phone  Notes  Estate manager/land agent Services  (  [phone]) [phone]   Angie FavaJulie Brannon, PhD [address], [address] [place], [place]   [phone]) [phone] or [phone](336) [phone]   [facility] [place]   [address] [place], [place] [phone](336) [phone]   [facility] [address], [place], [place] [phone](336) [phone]  Insurance/Medicaid/sponsorship through [facility]  [facility] [address]                                    [place], [place] [phone](336) [phone] Therapy/tele-psych/case  [facility] [address]   [place], [place] [phone]) [phone]    Dr. Lolly MustacheArfeen  [phone](336) [phone]   [facility]  [employer] [facility] 1) [address], [place] 2) [address], [place] 3)  [address], [place] [phone](336) [phone]  [phone](336) [phone]   [facility] Child Abuse Hotline [phone](336) [phone] or [phone](336) [phone] (After Hours)
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
## 2013-08-04 NOTE — ED Notes (Signed)
States no BM x 2 weeks, nausea,bad GERD, pain in rib cage and back; recent pneumonia , treated w levaquin

## 2013-08-05 MED ORDER — POLYETHYLENE GLYCOL 3350 17 G PO PACK: 17.0000 g | PACK | Freq: Every day | ORAL | Status: DC

## 2013-08-05 NOTE — ED Provider Notes (Signed)
Medical screening examination/treatment/procedure(s) were performed by non-physician practitioner and as supervising physician I was immediately available for consultation/collaboration.  EKG Interpretation   None         Rebekah Scobee Y. Kaelob Persky, MD 08/05/13 2008 

## 2013-10-24 ENCOUNTER — Emergency Department (INDEPENDENT_AMBULATORY_CARE_PROVIDER_SITE_OTHER)
Admission: EM | Admit: 2013-10-24 | Discharge: 2013-10-24 | Disposition: A | Discharge status: Home / Self Care | Payer: BC Managed Care – PPO | Source: Home / Self Care

## 2013-10-24 ENCOUNTER — Encounter (HOSPITAL_COMMUNITY): Payer: Self-pay | Admitting: Emergency Medicine

## 2013-10-24 DIAGNOSIS — Z760 Encounter for issue of repeat prescription: Secondary | ICD-10-CM

## 2013-10-24 MED ORDER — BUPROPION HCL ER (XL) 450 MG PO TB24: 450.0000 mg | ORAL_TABLET | Freq: Every day | ORAL | Status: DC

## 2013-10-24 MED ORDER — LEVOTHYROXINE SODIUM 125 MCG PO TABS: 125.0000 ug | ORAL_TABLET | Freq: Every day | ORAL | Status: DC

## 2013-10-24 MED ORDER — FROVATRIPTAN SUCCINATE 2.5 MG PO TABS: 2.5000 mg | ORAL_TABLET | ORAL | Status: DC | PRN

## 2013-10-24 MED ORDER — TOPIRAMATE 100 MG PO TABS: 100.0000 mg | ORAL_TABLET | Freq: Every day | ORAL | Status: DC

## 2013-10-24 MED ORDER — DEXMETHYLPHENIDATE HCL ER 20 MG PO CP24: 20.0000 mg | ORAL_CAPSULE | ORAL | Status: DC

## 2013-10-24 MED ORDER — ESCITALOPRAM OXALATE 20 MG PO TABS: 40.0000 mg | ORAL_TABLET | Freq: Every day | ORAL | Status: DC

## 2013-10-24 MED ORDER — DEXMETHYLPHENIDATE HCL 10 MG PO TABS: 10.0000 mg | ORAL_TABLET | Freq: Every day | ORAL | Status: DC

## 2013-10-24 NOTE — ED Provider Notes (Signed)
CSN: [id number]     Arrival date & time 10/24/13  1739 History   None    Chief Complaint  Patient presents with  . Medication Refill   (Consider location/radiation/quality/duration/timing/severity/associated sxs/prior Treatment) HPI  She is here today for medication refills.  She was seeing a physician out of the hospital but he had to retire due to health reasons.  She is in the process of finding a new PCP.  She needs refills on her depression, hypothyroid, ADD, and migraine.  She has no other complaints or concerns.  She has been out of these medications for 5 days.  Past Medical History  Diagnosis Date  . Anemia   . Chronic kidney disease   . Thyroid disease   . Chronic pain   . Fibromyalgia    Past Surgical History  Procedure Laterality Date  . Avascular necrosis of the hip      multiple operations starting in 2005.  bilateral hips  . Total hip arthroplasty    . Kidney surgery  1971    kidney reconstruction  . Appendectomy  1974  . Cervical fusion  [id number]  . Laminectomy  1985  . Back fusion  1999   Family History  Problem Relation Age of Onset  . Cancer Sister   . Colon cancer Neg Hx   . Stomach cancer Neg Hx    History  Substance Use Topics  . Smoking status: Never Smoker   . Smokeless tobacco: Never Used  . Alcohol Use: Yes     Comment: occasional   OB History   Grav Para Term Preterm Abortions TAB SAB Ect Mult Living                 Review of Systems  All other systems reviewed and are negative.   Allergies  Doxycycline; Penicillins; and Sulfa antibiotics  Home Medications   Current Outpatient Rx  Name  Route  Sig  Dispense  Refill  . BuPROPion HCl ER, XL, 450 MG TB24   Oral   Take 450 mg by mouth daily.   30 tablet   0   . dexmethylphenidate (FOCALIN XR) 20 MG 24 hr capsule   Oral   Take 1 capsule (20 mg total) by mouth every morning.   30 capsule   0   . dexmethylphenidate (FOCALIN) 10 MG tablet   Oral   Take 1 tablet (10 mg  total) by mouth daily after lunch.   30 tablet   0   . diclofenac (VOLTAREN) 50 MG EC tablet   Oral   Take 100 mg by mouth every morning.          . escitalopram (LEXAPRO) 20 MG tablet   Oral   Take 2 tablets (40 mg total) by mouth daily.   60 tablet   0   . frovatriptan (FROVA) 2.5 MG tablet   Oral   Take 1 tablet (2.5 mg total) by mouth as needed for migraine. If recurs, may repeat after 2 hours. Max of 3 tabs in 24 hours.   15 tablet   0   . L-Methylfolate (DEPLIN) 7.5 MG TABS   Oral   Take 1 tablet by mouth every morning.         Marland Kitchen. levothyroxine (SYNTHROID, LEVOTHROID) 125 MCG tablet   Oral   Take 1 tablet (125 mcg total) by mouth daily.   30 tablet   0   . methocarbamol (ROBAXIN) 500 MG tablet   Oral   Take  500 mg by mouth every morning.          . ondansetron (ZOFRAN) 4 MG tablet   Oral   Take 1 tablet (4 mg total) by mouth every 6 (six) hours.   12 tablet   0   . polyethylene glycol (MIRALAX / GLYCOLAX) packet   Oral   Take 17 g by mouth daily.   14 each   0   . topiramate (TOPAMAX) 100 MG tablet   Oral   Take 1 tablet (100 mg total) by mouth daily.   30 tablet   0    BP 154/86  Pulse 85  Temp(Src) 99.1 F (37.3 C) (Oral)  Resp 20  SpO2 96% Physical Exam  Constitutional: She appears well-developed and well-nourished. No distress.  Skin: Skin is warm and dry.  Psychiatric: She has a normal mood and affect. Her behavior is normal. Judgment and thought content normal.    ED Course  Procedures (including critical care time) Labs Review Labs Reviewed - No data to display Imaging Review No results found.   MDM   1. Medication refill    Refilled synthroid, wellbutrin, lexapro, topomax, focalin, and migraine abortant for 1 month. Encouraged her to find a PCP as soon as possible.    Charm Rings, MD 10/24/13 2135

## 2013-10-24 NOTE — Discharge Instructions (Signed)
I have refilled your medications for 1 month. Please work on getting a new primary doctor.

## 2013-10-24 NOTE — ED Notes (Signed)
Pt needing refills on meds No PCP at the moment Has been out of meds x5 days Voices no concerns Alert w/no signs of acute distress.

## 2013-10-25 NOTE — ED Provider Notes (Signed)
Medical screening examination/treatment/procedure(s) were performed by resident physician or non-physician practitioner and as supervising physician I was immediately available for consultation/collaboration.   KINDL,JAMES DOUGLAS MD.   James D Kindl, MD 10/25/13 1435 

## 2013-10-26 ENCOUNTER — Telehealth: Payer: Self-pay | Admitting: Gastroenterology

## 2013-10-26 NOTE — Telephone Encounter (Signed)
Pt was seen in Urgent care for dysphagia and told to follow-up with GI. Pt states she has been having "attacks" where she has difficulty swallowing. Pt scheduled to see Amy Esterwood PA 10/29/13@3pm . Pt aware of appt.

## 2013-10-29 ENCOUNTER — Ambulatory Visit (INDEPENDENT_AMBULATORY_CARE_PROVIDER_SITE_OTHER): Payer: BC Managed Care – PPO | Admitting: Physician Assistant

## 2013-10-29 ENCOUNTER — Encounter: Payer: Self-pay | Admitting: Physician Assistant

## 2013-10-29 ENCOUNTER — Other Ambulatory Visit: Payer: Self-pay | Admitting: Emergency Medicine

## 2013-10-29 VITALS — BP 130/80 | HR 90 | Ht 61.5 in | Wt 129.0 lb

## 2013-10-29 DIAGNOSIS — K5730 Diverticulosis of large intestine without perforation or abscess without bleeding: Secondary | ICD-10-CM | POA: Insufficient documentation

## 2013-10-29 DIAGNOSIS — R6889 Other general symptoms and signs: Secondary | ICD-10-CM

## 2013-10-29 DIAGNOSIS — R1310 Dysphagia, unspecified: Secondary | ICD-10-CM

## 2013-10-29 DIAGNOSIS — B839 Helminthiasis, unspecified: Secondary | ICD-10-CM

## 2013-10-29 DIAGNOSIS — R0989 Other specified symptoms and signs involving the circulatory and respiratory systems: Secondary | ICD-10-CM

## 2013-10-29 MED ORDER — MAGIC MOUTHWASH W/LIDOCAINE: ORAL | Status: DC

## 2013-10-29 MED ORDER — MAGIC MOUTHWASH W/LIDOCAINE: 5.0000 mL | Freq: Four times a day (QID) | ORAL | Status: DC

## 2013-10-29 MED ORDER — SUMATRIPTAN SUCCINATE 50 MG PO TABS: 50.0000 mg | ORAL_TABLET | ORAL | Status: DC | PRN

## 2013-10-29 NOTE — Progress Notes (Signed)
Subjective:    Patient ID: [name], female    DOB: 11/02/1950, 63 y.o.   MRN: [id number]  HPI  [name] is a 63 year old white female known to Dr. Arlyce DiceKaplan. She had undergone colonoscopy in May of 2013 with finding only of diverticulosis. She has history of thyroid disease, fibromyalgia, and avascular necrosis of the hip with several surgeries. She is status post appendectomy and reconstruction of her kidney after an accident. 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 She says she is concerned because she has had a couple of recent episodes of significant choking. He says she had one episode couple of weeks ago after she had swallowed a small piece of chicken. She says she felt it became lodged and then caused a severe pain "in her solar plexus". She says eventually she was able to stretch out and after a lot of coughing brought up to piece of chicken. After that she says she also brought up "2 L of foamy material". She says she has had one other episode since with eating. She says she tends to have more problems with solids but has also had some trouble swallowing with liquids. She also mentions that she had an episode couple of weeks ago where she felt she had an area and her rib cage on the left but "popped out. She wonders if this had anything to do with the spasm. She denies any heartburn or indigestion and no odynophagia.  Appetite has been okay weight has been stable   Patient also mentions that she has been trying to convince doctors for the past couple of years that she has parasites but says she's been unsuccessful. She says she is still convinced that she has the parasites and wanted to showing several pictures of her stool. She showed me several pictures on her cell phone which looked like loose stool in the commode with some mucus.  I asked if she had  been tested for parasites and  she said not by stool specimen.   Review of Systems  Constitutional: Negative.   HENT: Positive for trouble swallowing.   Eyes: Negative.   Respiratory: Positive for cough and choking.   Cardiovascular: Negative.   Gastrointestinal: Positive for diarrhea.  Endocrine: Negative.   Genitourinary: Negative.   Musculoskeletal: Negative.   Allergic/Immunologic: Negative.   Neurological: Negative.   Hematological: Negative.   Psychiatric/Behavioral: Negative.    Outpatient Prescriptions Prior to Visit  Medication Sig Dispense Refill  . BuPROPion HCl ER, XL, 450 MG TB24 Take 450 mg by mouth daily.  30 tablet  0  . dexmethylphenidate (FOCALIN XR) 20 MG 24 hr capsule Take 1 capsule (20 mg total) by mouth every morning.  30 capsule  0  . dexmethylphenidate (FOCALIN) 10 MG tablet Take 1 tablet (10 mg total) by mouth daily after lunch.  30 tablet  0  . diclofenac (VOLTAREN) 50 MG EC tablet Take 100 mg by mouth every morning.       . escitalopram (LEXAPRO) 20 MG tablet Take 2 tablets (40 mg total) by mouth daily.  60 tablet  0  . L-Methylfolate (DEPLIN) 7.5 MG TABS Take 1 tablet by mouth every morning.      Marland Kitchen. levothyroxine (SYNTHROID, LEVOTHROID) 125 MCG tablet Take 1 tablet (125 mcg total) by mouth daily.  30 tablet  0  . methocarbamol (ROBAXIN) 500 MG tablet Take 500 mg by  mouth every morning.       . ondansetron (ZOFRAN) 4 MG tablet Take 1 tablet (4 mg total) by mouth every 6 (six) hours.  12 tablet  0  . polyethylene glycol (MIRALAX / GLYCOLAX) packet Take 17 g by mouth daily.  14 each  0  . SUMAtriptan (IMITREX) 50 MG tablet Take 1 tablet (50 mg total) by mouth every 2 (two) hours as needed for migraine or headache. May repeat in 2 hours if headache persists or recurs.  10 tablet  0  . topiramate (TOPAMAX) 100 MG tablet Take 1 tablet (100 mg total) by mouth daily.  30 tablet  0   No facility-administered medications prior to visit.   Allergies  Allergen Reactions  . Doxycycline Nausea And  Vomiting  . Penicillins Hives  . Sulfa Antibiotics     Per pt: unknown   Patient Active Problem List   Diagnosis Date Noted  . Diverticulosis of colon without hemorrhage 10/29/2013  . 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 Smoking status: Never Smoker   . Smokeless tobacco: Never Used  . Alcohol Use: Yes     Comment: occasional   family history includes Cancer in her sister. There is no history of Colon cancer or Stomach cancer.     Objective:   Physical Exam and and and well-developed older white female in no acute distress, blood pressure 130/80 pulse 90 height 5 foot 1 weight 129. HEENT; nontraumatic normocephalic EOMI PERRLA sclera anicteric oropharynx is clear she does have a yellowish-brown exudate on her tongue,Neck; Supple no JVD, Cardiovascular; regular rate and rhythm with S1-S2 no murmur or gallop, Pulm; clear bilaterally, Abdomen; soft nontender nondistended bowel sounds are active there is no palpable mass or hepatosplenomegaly, Rectal ;exam not done, Extremities; no clubbing cyanosis or edema skin warm and dry, Psych ;mood and affect appropriate         Assessment & Plan:   #851  63 year old female with intermittent episodes of dysphagia both to solids and liquids -rule out esophageal stricture, Zenker's diverticulum,  motility disorder . #2  history of delusional parasitosis -patient still convinced of parasitic infection and is agreeable to testing  #3 diverticulosis  #4 hypothyroidism  #5 question other psychiatric diagnosis   Plan ; Schedule patient for upper endoscopy with Dr. Arlyce DiceKaplan -procedure discussed in detail with the patient -she is agreeable to proceed  Stool for O&P  Magic mouthwash 5 cc swish and swallow 4 times daily x10 days for possible mild oral thrush

## 2013-10-29 NOTE — Patient Instructions (Signed)
Please go to the basement level to our lab for a stool test. We [facility] a [facility] to [facility] for [employer] Mouthwash.   You have been scheduled for an endoscopy with propofol. Please follow written instructions given to you at your visit today. If you use inhalers (even only as needed), please bring them with you on the day of your procedure.

## 2013-10-30 ENCOUNTER — Encounter: Payer: Self-pay | Admitting: Gastroenterology

## 2013-10-30 ENCOUNTER — Ambulatory Visit (AMBULATORY_SURGERY_CENTER): Payer: BC Managed Care – PPO | Admitting: Gastroenterology

## 2013-10-30 VITALS — BP 100/60 | HR 79 | Temp 99.3°F | Resp 19 | Ht 61.5 in | Wt 129.0 lb

## 2013-10-30 DIAGNOSIS — K209 Esophagitis, unspecified without bleeding: Secondary | ICD-10-CM

## 2013-10-30 DIAGNOSIS — K222 Esophageal obstruction: Secondary | ICD-10-CM

## 2013-10-30 DIAGNOSIS — K2940 Chronic atrophic gastritis without bleeding: Secondary | ICD-10-CM

## 2013-10-30 DIAGNOSIS — R1310 Dysphagia, unspecified: Secondary | ICD-10-CM

## 2013-10-30 DIAGNOSIS — K2970 Gastritis, unspecified, without bleeding: Secondary | ICD-10-CM

## 2013-10-30 DIAGNOSIS — K259 Gastric ulcer, unspecified as acute or chronic, without hemorrhage or perforation: Secondary | ICD-10-CM

## 2013-10-30 DIAGNOSIS — K2990 Gastroduodenitis, unspecified, without bleeding: Secondary | ICD-10-CM

## 2013-10-30 MED ORDER — SODIUM CHLORIDE 0.9 % IV SOLN: 500.0000 mL | INTRAVENOUS | Status: DC

## 2013-10-30 MED ORDER — PANTOPRAZOLE SODIUM 40 MG PO TBEC: 40.0000 mg | DELAYED_RELEASE_TABLET | Freq: Every day | ORAL | Status: DC

## 2013-10-30 NOTE — Progress Notes (Signed)
Patient arrived in Recovery Room with bite block in mouth.   Patient opened her mouth and her temporary bridge fell out.  It was broken.   Supervisor notified, and she spoke with the CRNA.  He said that it "was that way" when he brought her out.  Doctor and patient notified at this time.   Patient stated that she "would just call and get it fixed."  Patient was not angry.  Piece of the temporary bridge saved for patient to bring to dentist.

## 2013-10-30 NOTE — Progress Notes (Signed)
Lidocaine-40mg IV prior to Propofol InductionPropofol given over incremental dosages 

## 2013-10-30 NOTE — Patient Instructions (Signed)
YOU HAD AN ENDOSCOPIC PROCEDURE TODAY AT THE [facility]: Refer to the procedure report that was given to you for any specific questions about what was found during the examination.  If the procedure report does not answer your questions, please call your gastroenterologist to clarify.  If you requested that your care partner not be given the details of your procedure findings, then the procedure report has been included in a sealed envelope for you to review at your convenience later.  YOU SHOULD EXPECT: Some feelings of bloating in the abdomen. Passage of more gas than usual.  Walking can help get rid of the air that was put into your GI tract during the procedure and reduce the bloating. If you had a lower endoscopy (such as a colonoscopy or flexible sigmoidoscopy) you may notice spotting of blood in your stool or on the toilet paper. If you underwent a bowel prep for your procedure, then you may not have a normal bowel movement for a few days.  DIET: You may have water at 12:30pm today, and then have a soft diet only for the rest of the day today.  You were dilated, and might have some swelling in your esophagus.  Drink plenty of fluids but you should avoid alcoholic beverages for 24 hours.  ACTIVITY: Your care partner should take you home directly after the procedure.  You should plan to take it easy, moving slowly for the rest of the day.  You can resume normal activity the day after the procedure however you should NOT DRIVE or use heavy machinery for 24 hours (because of the sedation medicines used during the test).    SYMPTOMS TO REPORT IMMEDIATELY: A gastroenterologist can be reached at any hour.  During normal business hours, 8:30 AM to 5:00 PM Monday through Friday, call [phone].  After hours and on weekends, please call the GI answering service at [phone] who will take a message and have the physician on call contact you.   Following upper endoscopy  (EGD)  Vomiting of blood or coffee ground material  New chest pain or pain under the shoulder blades  Painful or persistently difficult swallowing  New shortness of breath  Fever of 100F or higher  Black, tarry-looking stools  FOLLOW UP: If any biopsies were taken you will be contacted by phone or by letter within the next 1-3 weeks.  Call your gastroenterologist if you have not heard about the biopsies in 3 weeks.  Our staff will call the home number listed on your records the next business day following your procedure to check on you and address any questions or concerns that you may have at that time regarding the information given to you following your procedure. This is a courtesy call and so if there is no answer at the home number and we have not heard from you through the emergency physician on call, we will assume that you have returned to your regular daily activities without incident.  SIGNATURES/CONFIDENTIALITY: You and/or your care partner have signed paperwork which will be entered into your electronic medical record.  These signatures attest to the fact that that the information above on your After Visit Summary has been reviewed and is understood.  Full responsibility of the confidentiality of this discharge information lies with you and/or your care-partner.  Bonita QuinLinda, the RN for Dr. Arlyce DiceKaplan on the third floor will schedule your Barium Swallow, and your OV in 1 mo.  She will call you sometime today  or tomorrow with instructions.  Please, start Protonix 40mg  1/2 hr before breakfast each morning to help reduce your reflux.

## 2013-10-30 NOTE — Op Note (Signed)
 [facility] [address] [place], [id number]   ENDOSCOPY PROCEDURE REPORT  PATIENT: [name], Rebekah D.  MR#: [id number] BIRTHDATE: 1951-01-31 , 62  yrs. old GENDER: Female ENDOSCOPIST: Louis Meckelobert D Kaplan, MD ASSISTANT: REFERRED XB:[id number]:Jessica Copland, M.D. PROCEDURE DATE:  10/30/2013 PROCEDURE:   EGD with balloon dilatation and EGD with biopsy ASA CLASS:   Class II INDICATIONS:dysphagia, history of Zenker's diverticulum MEDICATIONS: MAC sedation, administered by CRNA, Fentanyl-Quick Pick, and propofol (Diprivan) 200mg  IV TOPICAL ANESTHETIC:  DESCRIPTION OF PROCEDURE:   After the risks benefits and alternatives of the procedure were thoroughly explained, informed consent was obtained.  The     endoscope was introduced through the mouth  and advanced to the third portion of the duodenum ,      The instrument was slowly withdrawn as the mucosa was carefully examined.    There was erythema and a superficial ulcer at the GE junction. There was moderate stricturing.  The 9 mm gastroscope easily traversed the stricture. There is several superficial gastric erosions including linear erosions in the gastric body.  Biopsies were taken.   There was erythema and a superficial ulcer at the GE junction.  There was moderate stricturing.  The 9 mm gastroscope easily traversed the stricture. There is several superficial gastric erosions including linear erosions in the gastric body.  Biopsies were taken.   The remainder of the upper endoscopy exam was otherwise normal.     Dilation was then performed at the gastroesphageal junction  Dilator:Balloon Size:16.5-18mm  Reststance:moderate Heme:yes Appearance:adequate  minimal heme  COMPLICATIONS: There were no complications. ENDOSCOPIC IMPRESSION: 1.  esophageal stricture-status post balloon dilation 2.  esophageal ulcer, esophagitis, erosive gastritis  RECOMMENDATIONS: await biopsy results barium swallow to evaluate Zenker's  diverticulum begin Protonix 40 mg daily Office visit one  month   eSigned:  Louis Meckelobert D Kaplan, MD 10/30/2013 11:27 AM  CC:  PATIENT NAME:  [name], Rebekah D. MR#: [id number]

## 2013-10-30 NOTE — Progress Notes (Signed)
Reviewed and agree with management. Barbette Hairobert D. Arlyce DiceKaplan, M.D., [facility] Let's obtain a barium swallow prior to upper endoscopy

## 2013-10-30 NOTE — Progress Notes (Signed)
Pt asked if she could have a prescription for nausea, pt states she gets nauseated after anesthesia, explained to pt that propofol has anti-emetic properties and she should not get sick on stomach, advised pt if she gets home and has any problems to give us a call, pt has office number on discharge sheets.-adm

## 2013-10-30 NOTE — Progress Notes (Signed)
Called to room to assist during endoscopic procedure.  Patient ID and intended procedure confirmed with present staff. Received instructions for my participation in the procedure from the performing physician.  

## 2013-10-31 ENCOUNTER — Other Ambulatory Visit: Payer: Self-pay

## 2013-10-31 ENCOUNTER — Telehealth: Payer: Self-pay | Admitting: *Deleted

## 2013-10-31 ENCOUNTER — Telehealth: Payer: Self-pay

## 2013-10-31 DIAGNOSIS — K225 Diverticulum of esophagus, acquired: Secondary | ICD-10-CM

## 2013-10-31 NOTE — Telephone Encounter (Signed)
Pt scheduled to see Dr. Arlyce DiceKaplan 11/30/13@9 :30am for follow-up. [facility] . Pt to arrive there at 9:45am. Pt to be NPO after 7am. Left message for pt to call back.  Spoke with pt and she is aware of appts.

## 2013-10-31 NOTE — Telephone Encounter (Signed)
Message left

## 2013-10-31 NOTE — Progress Notes (Signed)
Please call pt and let her know Dr. Arlyce DiceKaplan wants her to have a barium swallow prior to doing her endoscopy.Please schedule

## 2013-11-02 ENCOUNTER — Ambulatory Visit (HOSPITAL_COMMUNITY)
Admission: RE | Admit: 2013-11-02 | Discharge: 2013-11-02 | Disposition: A | Discharge status: Home / Self Care | Payer: BC Managed Care – PPO | Source: Ambulatory Visit | Attending: Gastroenterology | Admitting: Gastroenterology

## 2013-11-02 DIAGNOSIS — K449 Diaphragmatic hernia without obstruction or gangrene: Secondary | ICD-10-CM | POA: Insufficient documentation

## 2013-11-02 DIAGNOSIS — Q762 Congenital spondylolisthesis: Secondary | ICD-10-CM | POA: Insufficient documentation

## 2013-11-02 DIAGNOSIS — Z981 Arthrodesis status: Secondary | ICD-10-CM | POA: Insufficient documentation

## 2013-11-02 DIAGNOSIS — K222 Esophageal obstruction: Secondary | ICD-10-CM | POA: Insufficient documentation

## 2013-11-02 DIAGNOSIS — K224 Dyskinesia of esophagus: Secondary | ICD-10-CM | POA: Insufficient documentation

## 2013-11-02 DIAGNOSIS — R1310 Dysphagia, unspecified: Secondary | ICD-10-CM | POA: Insufficient documentation

## 2013-11-02 DIAGNOSIS — K225 Diverticulum of esophagus, acquired: Secondary | ICD-10-CM

## 2013-11-06 ENCOUNTER — Encounter: Payer: Self-pay | Admitting: Gastroenterology

## 2013-11-30 ENCOUNTER — Ambulatory Visit (INDEPENDENT_AMBULATORY_CARE_PROVIDER_SITE_OTHER): Payer: BC Managed Care – PPO | Admitting: Gastroenterology

## 2013-11-30 ENCOUNTER — Encounter: Payer: Self-pay | Admitting: Gastroenterology

## 2013-11-30 VITALS — BP 110/74 | HR 88 | Ht 60.75 in | Wt 131.2 lb

## 2013-11-30 DIAGNOSIS — F22 Delusional disorders: Secondary | ICD-10-CM

## 2013-11-30 DIAGNOSIS — F40298 Other specified phobia: Secondary | ICD-10-CM

## 2013-11-30 DIAGNOSIS — R070 Pain in throat: Secondary | ICD-10-CM | POA: Insufficient documentation

## 2013-11-30 DIAGNOSIS — K222 Esophageal obstruction: Secondary | ICD-10-CM | POA: Insufficient documentation

## 2013-11-30 MED ORDER — RANITIDINE HCL 300 MG PO TABS: 300.0000 mg | ORAL_TABLET | Freq: Two times a day (BID) | ORAL | Status: DC

## 2013-11-30 NOTE — Progress Notes (Signed)
          History of Present Illness:  The patient has returned following upper endoscopy with balloon dilation of a distal esophageal stricture.  A small esophageal ulcer was also seen.  Followup esophagram demonstrated poor peristalsis and a prominent cricopharyngeus,  Dysphasia clearly is improved.  Patient is concerned about a throat lesion.  She's had some pain in her face and teeth and was told by her dentist that there may be a sinus problem.  She's convinced that she has lesions on the upper palate and parasites in her skin and stomach.  At endoscopy superficial gastric erosions were seen and biopsies demonstrated inflammation only.    Review of Systems: Pertinent positive and negative review of systems were noted in the above HPI section. All other review of systems were otherwise negative.    Current Medications, Allergies, Past Medical History, Past Surgical History, Family History and Social History were reviewed in [facility]  Vital signs were reviewed in today's medical record. Physical Exam: General: Well developed , well nourished, no acute distress Examination of her throat did not reveal any mucosal abnormalities   See Assessment and Plan under Problem List

## 2013-11-30 NOTE — Assessment & Plan Note (Addendum)
She continues to be concerned about parasitic infection I see no evidence for this.  Recommendations #1 stool O&P

## 2013-11-30 NOTE — Patient Instructions (Signed)
We are referring you to see Dr Jenne PaneBates Your appointment is scheduled on 12/25/2013 1:10pm

## 2013-11-30 NOTE — Assessment & Plan Note (Signed)
I do not appreciate any abnormalities but, at the patient's insistence for further workup, I will refer to ENT.

## 2013-11-30 NOTE — Addendum Note (Signed)
Addended by: Marlowe KaysSTALLINGS, Nikko Quast M on: 11/30/2013 10:18 AM   Modules accepted: Orders

## 2013-11-30 NOTE — Assessment & Plan Note (Signed)
Dysphagia clearly is improved following dilation of a distal esophageal stricture.  Patient states that she developed a rash when she took Protonix.  Will switch to Zantac.

## 2014-03-01 ENCOUNTER — Other Ambulatory Visit: Payer: Self-pay | Admitting: Nurse Practitioner

## 2014-03-01 DIAGNOSIS — R1084 Generalized abdominal pain: Secondary | ICD-10-CM

## 2014-03-01 DIAGNOSIS — R110 Nausea: Secondary | ICD-10-CM

## 2014-04-21 ENCOUNTER — Encounter (HOSPITAL_COMMUNITY): Payer: Self-pay | Admitting: Emergency Medicine

## 2014-04-21 ENCOUNTER — Emergency Department (HOSPITAL_COMMUNITY): Payer: BC Managed Care – PPO

## 2014-04-21 ENCOUNTER — Emergency Department (HOSPITAL_COMMUNITY)
Admission: EM | Admit: 2014-04-21 | Discharge: 2014-04-21 | Disposition: A | Discharge status: Home / Self Care | Payer: BC Managed Care – PPO | Attending: Emergency Medicine | Admitting: Emergency Medicine

## 2014-04-21 DIAGNOSIS — R05 Cough: Secondary | ICD-10-CM | POA: Insufficient documentation

## 2014-04-21 DIAGNOSIS — J029 Acute pharyngitis, unspecified: Secondary | ICD-10-CM | POA: Insufficient documentation

## 2014-04-21 DIAGNOSIS — Z791 Long term (current) use of non-steroidal anti-inflammatories (NSAID): Secondary | ICD-10-CM | POA: Diagnosis not present

## 2014-04-21 DIAGNOSIS — Z880 Allergy status to penicillin: Secondary | ICD-10-CM | POA: Insufficient documentation

## 2014-04-21 DIAGNOSIS — R059 Cough, unspecified: Secondary | ICD-10-CM

## 2014-04-21 DIAGNOSIS — G8929 Other chronic pain: Secondary | ICD-10-CM | POA: Diagnosis not present

## 2014-04-21 DIAGNOSIS — N189 Chronic kidney disease, unspecified: Secondary | ICD-10-CM | POA: Diagnosis not present

## 2014-04-21 DIAGNOSIS — E079 Disorder of thyroid, unspecified: Secondary | ICD-10-CM | POA: Insufficient documentation

## 2014-04-21 DIAGNOSIS — Z79899 Other long term (current) drug therapy: Secondary | ICD-10-CM | POA: Insufficient documentation

## 2014-04-21 MED ORDER — BENZONATATE 100 MG PO CAPS: 100.0000 mg | ORAL_CAPSULE | Freq: Three times a day (TID) | ORAL | Status: DC

## 2014-04-21 NOTE — Discharge Instructions (Signed)
Cough, Adult   A cough is a reflex. It helps you clear your throat and airways. A cough can help heal your body. A cough can last 2 or 3 weeks (acute) or may last more than 8 weeks (chronic). Some common causes of a cough can include an infection, allergy, or a cold.  HOME CARE  · Only take medicine as told by your doctor.  · If given, take your medicines (antibiotics) as told. Finish them even if you start to feel better.  · Use a cold steam vaporizer or humidifier in your home. This can help loosen thick spit (secretions).  · Sleep so you are almost sitting up (semi-upright). Use pillows to do this. This helps reduce coughing.  · Rest as needed.  · Stop smoking if you smoke.  GET HELP RIGHT AWAY IF:  · You have yellowish-white fluid (pus) in your thick spit.  · Your cough gets worse.  · Your medicine does not reduce coughing, and you are losing sleep.  · You cough up blood.  · You have trouble breathing.  · Your pain gets worse and medicine does not help.  · You have a fever.  MAKE SURE YOU:   · Understand these instructions.  · Will watch your condition.  · Will get help right away if you are not doing well or get worse.  Document Released: 03/18/2011 Document Revised: 11/19/2013 Document Reviewed: 03/18/2011  ExitCare® Patient Information ©2015 [employer]. This information is not intended to replace advice given to you by your health care provider. Make sure you discuss any questions you have with your health care provider.

## 2014-04-21 NOTE — ED Provider Notes (Signed)
63 year old female presents with a nonproductive cough for 2 weeks, on exam the patient has no peripheral edema, normal heart and lung sounds, coughs occasionally with deep breathing, appears otherwise very benign. Check a chest x-ray, anticipate discharge given patient's benign appearance  I saw and evaluated the patient, reviewed the resident's note and I agree with the findings and plan.  Final diagnoses:  Cough      Vida RollerBrian D Aurea Aronov, MD 04/22/14 [phone]

## 2014-04-21 NOTE — ED Provider Notes (Signed)
CSN: [id number]     Arrival date & time 04/21/14  1554 History   First MD Initiated Contact with Patient 04/21/14 1604     Chief Complaint  Patient presents with  . Cough     (Consider location/radiation/quality/duration/timing/severity/associated sxs/prior Treatment) Patient is a 63 y.o. female presenting with cough.  Cough Cough characteristics:  Non-productive Severity:  Moderate Onset quality:  Gradual Duration:  2 weeks Timing:  Intermittent Progression:  Unchanged Chronicity:  Recurrent Smoker: no   Context: upper respiratory infection   Relieved by:  None tried Worsened by:  Nothing tried Ineffective treatments:  None tried Associated symptoms: sore throat   Associated symptoms: no chest pain, no chills, no fever, no headaches, no myalgias, no rash, no shortness of breath and no wheezing   Risk factors: recent infection     Past Medical History  Diagnosis Date  . Anemia   . Thyroid disease   . Chronic pain   . Fibromyalgia   . Chronic kidney disease     surgery to fix  . Lyme disease    Past Surgical History  Procedure Laterality Date  . Avascular necrosis of the hip      multiple operations starting in 2005.  bilateral hips  . Total hip arthroplasty    . Kidney surgery  1971    kidney reconstruction  . Appendectomy  1974  . Cervical fusion  [id number]  . Laminectomy  1985  . Back fusion  1999   Family History  Problem Relation Age of Onset  . Cancer Sister   . Colon cancer Neg Hx   . Stomach cancer Neg Hx   . Esophageal cancer Neg Hx    History  Substance Use Topics  . Smoking status: Never Smoker   . Smokeless tobacco: Never Used  . Alcohol Use: Yes     Comment: occasional   OB History   Grav Para Term Preterm Abortions TAB SAB Ect Mult Living                 Review of Systems  Constitutional: Negative for fever and chills.  HENT: Positive for sore throat. Negative for congestion.   Eyes: Negative for visual disturbance.  Respiratory:  Positive for cough. Negative for shortness of breath and wheezing.   Cardiovascular: Negative for chest pain.  Gastrointestinal: Negative for nausea, vomiting, abdominal pain, diarrhea and constipation.  Genitourinary: Negative for dysuria, difficulty urinating and vaginal pain.  Musculoskeletal: Negative for arthralgias and myalgias.  Skin: Negative for rash.  Neurological: Negative for syncope and headaches.  Psychiatric/Behavioral: Negative for behavioral problems.  All other systems reviewed and are negative.     Allergies  Doxycycline; Penicillins; and Sulfa antibiotics  Home Medications   Prior to Admission medications   Medication Sig Start Date End Date Taking? Authorizing Provider  BuPROPion HCl ER, XL, 450 MG TB24 Take 450 mg by mouth daily.   Yes Historical Provider, MD  Cholecalciferol (VITAMIN D PO) Take 1 capsule by mouth every other day.   Yes Historical Provider, MD  cyclobenzaprine (FLEXERIL) 5 MG tablet Take 5 mg by mouth 3 (three) times daily.  10/16/13  Yes Historical Provider, MD  dexmethylphenidate (FOCALIN XR) 20 MG 24 hr capsule Take 20 mg by mouth 2 (two) times daily. 10/24/13  Yes Charm RingsErin J Honig, MD  dexmethylphenidate (FOCALIN) 10 MG tablet Take 10 mg by mouth daily. 10/24/13  Yes Charm RingsErin J Honig, MD  diclofenac (VOLTAREN) 50 MG EC tablet Take 100 mg by mouth  every morning.    Yes Historical Provider, MD  escitalopram (LEXAPRO) 20 MG tablet Take 40 mg by mouth daily.   Yes Historical Provider, MD  L-Methylfolate (DEPLIN) 7.5 MG TABS Take 1 tablet by mouth every morning.   Yes Historical Provider, MD  levothyroxine (SYNTHROID, LEVOTHROID) 125 MCG tablet Take 125 mcg by mouth daily before breakfast.   Yes Historical Provider, MD  methocarbamol (ROBAXIN) 500 MG tablet Take 500 mg by mouth every morning.    Yes Historical Provider, MD  topiramate (TOPAMAX) 100 MG tablet Take 100 mg by mouth daily.   Yes Historical Provider, MD  VITAMIN K PO Take 2 capsules by mouth daily.    Yes Historical Provider, MD  XANAX 0.25 MG tablet Take 0.25 mg by mouth at bedtime as needed for sleep.  10/22/13  Yes Historical Provider, MD  benzonatate (TESSALON) 100 MG capsule Take 1 capsule (100 mg total) by mouth every 8 (eight) hours. 04/21/14   Beverely Risen, MD   BP 144/106  Pulse 109  Temp(Src) 98.9 F (37.2 C) (Oral)  Resp 18  Ht 5\' 2"  (1.575 m)  Wt 126 lb (57.153 kg)  BMI 23.04 kg/m2  SpO2 97% Physical Exam  Vitals reviewed. Constitutional: She is oriented to person, place, and time. She appears well-developed and well-nourished. No distress.  HENT:  Head: Normocephalic and atraumatic.  Eyes: EOM are normal.  Neck: Normal range of motion.  Cardiovascular: Normal rate, regular rhythm and normal heart sounds.   No murmur heard. Pulmonary/Chest: Effort normal and breath sounds normal. No respiratory distress. She has no wheezes. She has no rales. She exhibits no tenderness.  Abdominal: Soft. There is no tenderness.  Musculoskeletal: She exhibits no edema.  Neurological: She is alert and oriented to person, place, and time.  Skin: She is not diaphoretic.  Psychiatric: She has a normal mood and affect. Her behavior is normal.    ED Course  Procedures (including critical care time) Labs Review Labs Reviewed - No data to display  Imaging Review Dg Chest 2 View  04/21/2014   CLINICAL DATA:  Occasionally productive cough for 2-3 weeks, patient gives history of having pneumonia in January 2015, nonsmoker  EXAM: CHEST  2 VIEW  COMPARISON:  08/04/2013  FINDINGS: Heart size and vascular pattern are normal. The lungs are clear. No pleural effusions. Mild elevation of the right diaphragm stable. Severe degenerative changes of the right shoulder stable.  IMPRESSION: No active cardiopulmonary disease.   Electronically Signed   By: Esperanza Heir M.D.   On: 04/21/2014 17:16     EKG Interpretation None      MDM   Final diagnoses:  Cough    Patient is a 63 year old female  that presents with 2 weeks of cough. Patient states that she has recurrent URIs which is the packs for from her PCP. 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 Patient is not currently taking any medication for that. Patient states that she has had pneumonia about one year ago and she is concerned with 2 weeks of cough that she may be having recurrence. Patient denies any productive cough no fever no shortness of breath no chest pain. Patient states that she has had intermittent sore throat past several days. On exam patient is afebrile with stable vital signs. Patient's oropharynx looks normal. Patient has normal lung sounds bilaterally. Patient is well-appearing. Will obtain a chest x-ray given the duration of cough and reassess. Patient's chest x-ray unremarkable. Patient  given prescription for cough suppressant and is to followup with her PCP.   Beverely Risen, MD 04/21/14 [phone]

## 2014-04-21 NOTE — ED Notes (Signed)
Per pt sts she is currently being treated for parasitic infection. sts cough, HA. sts she feels like she's inhaling things.

## 2014-04-22 NOTE — ED Provider Notes (Signed)
I saw and evaluated the patient, reviewed the resident's note and I agree with the findings and plan.  Please see my separate note regarding my evaluation of the patient.     Vida RollerBrian D Dwan Fennel, MD 04/22/14 [phone]

## 2014-05-14 ENCOUNTER — Other Ambulatory Visit: Payer: Self-pay | Admitting: Nurse Practitioner

## 2014-05-14 DIAGNOSIS — R221 Localized swelling, mass and lump, neck: Secondary | ICD-10-CM

## 2014-05-14 DIAGNOSIS — R202 Paresthesia of skin: Secondary | ICD-10-CM

## 2014-05-14 DIAGNOSIS — H539 Unspecified visual disturbance: Secondary | ICD-10-CM

## 2014-05-14 DIAGNOSIS — R51 Headache: Secondary | ICD-10-CM

## 2014-05-14 DIAGNOSIS — R0989 Other specified symptoms and signs involving the circulatory and respiratory systems: Secondary | ICD-10-CM

## 2014-05-14 DIAGNOSIS — R200 Anesthesia of skin: Secondary | ICD-10-CM

## 2014-05-14 DIAGNOSIS — R519 Headache, unspecified: Secondary | ICD-10-CM

## 2014-05-15 ENCOUNTER — Ambulatory Visit
Admission: RE | Admit: 2014-05-15 | Discharge: 2014-05-15 | Disposition: A | Discharge status: Home / Self Care | Payer: BC Managed Care – PPO | Source: Ambulatory Visit | Attending: Nurse Practitioner | Admitting: Nurse Practitioner

## 2014-05-15 ENCOUNTER — Other Ambulatory Visit: Payer: BC Managed Care – PPO

## 2014-05-15 DIAGNOSIS — R51 Headache: Secondary | ICD-10-CM

## 2014-05-15 DIAGNOSIS — H539 Unspecified visual disturbance: Secondary | ICD-10-CM

## 2014-05-15 DIAGNOSIS — R200 Anesthesia of skin: Secondary | ICD-10-CM

## 2014-05-15 DIAGNOSIS — R519 Headache, unspecified: Secondary | ICD-10-CM

## 2014-05-15 DIAGNOSIS — R202 Paresthesia of skin: Secondary | ICD-10-CM

## 2014-05-15 MED ORDER — IOHEXOL 300 MG/ML  SOLN
75.0000 mL | Freq: Once | INTRAMUSCULAR | Status: AC | PRN
  Administered 2014-05-15: 75 mL via INTRAVENOUS

## 2015-07-29 ENCOUNTER — Emergency Department (HOSPITAL_COMMUNITY)
Admission: EM | Admit: 2015-07-29 | Discharge: 2015-07-29 | Disposition: A | Discharge status: Home / Self Care | Payer: Medicare Other | Attending: Emergency Medicine | Admitting: Emergency Medicine

## 2015-07-29 ENCOUNTER — Emergency Department (HOSPITAL_COMMUNITY): Payer: Medicare Other

## 2015-07-29 ENCOUNTER — Encounter (HOSPITAL_COMMUNITY): Payer: Self-pay | Admitting: Radiology

## 2015-07-29 DIAGNOSIS — M6289 Other specified disorders of muscle: Secondary | ICD-10-CM | POA: Diagnosis not present

## 2015-07-29 DIAGNOSIS — G8929 Other chronic pain: Secondary | ICD-10-CM | POA: Diagnosis not present

## 2015-07-29 DIAGNOSIS — Z880 Allergy status to penicillin: Secondary | ICD-10-CM | POA: Diagnosis not present

## 2015-07-29 DIAGNOSIS — Z79899 Other long term (current) drug therapy: Secondary | ICD-10-CM | POA: Diagnosis not present

## 2015-07-29 DIAGNOSIS — R200 Anesthesia of skin: Secondary | ICD-10-CM | POA: Insufficient documentation

## 2015-07-29 DIAGNOSIS — R4781 Slurred speech: Secondary | ICD-10-CM | POA: Insufficient documentation

## 2015-07-29 DIAGNOSIS — M79604 Pain in right leg: Secondary | ICD-10-CM | POA: Insufficient documentation

## 2015-07-29 DIAGNOSIS — Z8619 Personal history of other infectious and parasitic diseases: Secondary | ICD-10-CM | POA: Insufficient documentation

## 2015-07-29 DIAGNOSIS — M79606 Pain in leg, unspecified: Secondary | ICD-10-CM | POA: Diagnosis not present

## 2015-07-29 DIAGNOSIS — N189 Chronic kidney disease, unspecified: Secondary | ICD-10-CM | POA: Diagnosis not present

## 2015-07-29 DIAGNOSIS — R4701 Aphasia: Secondary | ICD-10-CM | POA: Diagnosis not present

## 2015-07-29 DIAGNOSIS — F911 Conduct disorder, childhood-onset type: Secondary | ICD-10-CM | POA: Diagnosis not present

## 2015-07-29 DIAGNOSIS — M797 Fibromyalgia: Secondary | ICD-10-CM | POA: Insufficient documentation

## 2015-07-29 DIAGNOSIS — H538 Other visual disturbances: Secondary | ICD-10-CM | POA: Insufficient documentation

## 2015-07-29 DIAGNOSIS — E079 Disorder of thyroid, unspecified: Secondary | ICD-10-CM | POA: Diagnosis not present

## 2015-07-29 DIAGNOSIS — R531 Weakness: Secondary | ICD-10-CM | POA: Insufficient documentation

## 2015-07-29 DIAGNOSIS — R29810 Facial weakness: Secondary | ICD-10-CM | POA: Diagnosis not present

## 2015-07-29 DIAGNOSIS — Z862 Personal history of diseases of the blood and blood-forming organs and certain disorders involving the immune mechanism: Secondary | ICD-10-CM | POA: Diagnosis not present

## 2015-07-29 DIAGNOSIS — Z791 Long term (current) use of non-steroidal anti-inflammatories (NSAID): Secondary | ICD-10-CM | POA: Insufficient documentation

## 2015-07-29 LAB — URINE MICROSCOPIC-ADD ON: RBC / HPF: NONE SEEN RBC/hpf (ref 0–5)

## 2015-07-29 LAB — CBC
HCT: 43.2 % (ref 36.0–46.0)
Hemoglobin: 14.6 g/dL (ref 12.0–15.0)
MCH: 31.6 pg (ref 26.0–34.0)
MCHC: 33.8 g/dL (ref 30.0–36.0)
MCV: 93.5 fL (ref 78.0–100.0)
Platelets: 266 10*3/uL (ref 150–400)
RBC: 4.62 MIL/uL (ref 3.87–5.11)
RDW: 13.2 % (ref 11.5–15.5)
WBC: 8 10*3/uL (ref 4.0–10.5)

## 2015-07-29 LAB — URINALYSIS, ROUTINE W REFLEX MICROSCOPIC
Bilirubin Urine: NEGATIVE
Glucose, UA: NEGATIVE mg/dL
Hgb urine dipstick: NEGATIVE
Ketones, ur: NEGATIVE mg/dL
Nitrite: NEGATIVE
Protein, ur: NEGATIVE mg/dL
Specific Gravity, Urine: 1.006 (ref 1.005–1.030)
pH: 6 (ref 5.0–8.0)

## 2015-07-29 LAB — I-STAT TROPONIN, ED: Troponin i, poc: 0 ng/mL (ref 0.00–0.08)

## 2015-07-29 LAB — I-STAT CHEM 8, ED
BUN: 18 mg/dL (ref 6–20)
Calcium, Ion: 1.2 mmol/L (ref 1.13–1.30)
Chloride: 108 mmol/L (ref 101–111)
Creatinine, Ser: 0.9 mg/dL (ref 0.44–1.00)
Glucose, Bld: 95 mg/dL (ref 65–99)
HCT: 46 % (ref 36.0–46.0)
Hemoglobin: 15.6 g/dL — ABNORMAL HIGH (ref 12.0–15.0)
Potassium: 3.6 mmol/L (ref 3.5–5.1)
Sodium: 142 mmol/L (ref 135–145)
TCO2: 19 mmol/L (ref 0–100)

## 2015-07-29 LAB — RAPID URINE DRUG SCREEN, HOSP PERFORMED
Amphetamines: NOT DETECTED
Barbiturates: NOT DETECTED
Benzodiazepines: NOT DETECTED
Cocaine: NOT DETECTED
Opiates: NOT DETECTED
Tetrahydrocannabinol: NOT DETECTED

## 2015-07-29 LAB — COMPREHENSIVE METABOLIC PANEL
ALT: 29 U/L (ref 14–54)
AST: 26 U/L (ref 15–41)
Albumin: 3.9 g/dL (ref 3.5–5.0)
Alkaline Phosphatase: 74 U/L (ref 38–126)
Anion gap: 11 (ref 5–15)
BUN: 15 mg/dL (ref 6–20)
CO2: 19 mmol/L — ABNORMAL LOW (ref 22–32)
Calcium: 9 mg/dL (ref 8.9–10.3)
Chloride: 109 mmol/L (ref 101–111)
Creatinine, Ser: 0.98 mg/dL (ref 0.44–1.00)
GFR calc Af Amer: >60 mL/min (ref >60)
GFR calc non Af Amer: 59 mL/min — ABNORMAL LOW (ref >60)
Glucose, Bld: 100 mg/dL — ABNORMAL HIGH (ref 65–99)
Potassium: 3.7 mmol/L (ref 3.5–5.1)
Sodium: 139 mmol/L (ref 135–145)
Total Bilirubin: 0.3 mg/dL (ref 0.3–1.2)
Total Protein: 6.2 g/dL — ABNORMAL LOW (ref 6.5–8.1)

## 2015-07-29 LAB — DIFFERENTIAL
Basophils Absolute: 0.1 10*3/uL (ref 0.0–0.1)
Basophils Relative: 1 %
Eosinophils Absolute: 0.5 10*3/uL (ref 0.0–0.7)
Eosinophils Relative: 6 %
Lymphocytes Relative: 34 %
Lymphs Abs: 2.7 10*3/uL (ref 0.7–4.0)
Monocytes Absolute: 0.7 10*3/uL (ref 0.1–1.0)
Monocytes Relative: 9 %
Neutro Abs: 4.1 10*3/uL (ref 1.7–7.7)
Neutrophils Relative %: 50 %

## 2015-07-29 LAB — PROTIME-INR
INR: 1.06 (ref 0.00–1.49)
Prothrombin Time: 14 seconds (ref 11.6–15.2)

## 2015-07-29 LAB — ETHANOL: Alcohol, Ethyl (B): <5 mg/dL (ref <5)

## 2015-07-29 LAB — APTT: aPTT: 25 seconds (ref 24–37)

## 2015-07-29 MED ORDER — LORAZEPAM 2 MG/ML IJ SOLN
1.0000 mg | Freq: Once | INTRAMUSCULAR | Status: AC
  Administered 2015-07-29: 1 mg via INTRAVENOUS
  Filled 2015-07-29: qty 1

## 2015-07-29 NOTE — ED Notes (Signed)
Pt arrived to ED via son and POV. Pt alert x4. Last seen normal 2320. Pt presented with slurred speech, expressive aphagia, R sided droop, R weakness. PT states at approximately 9 am she started having someone numbness approx 10 pm last night. Eyesight worsened yesterday approx 5 pm.. Pt states she was also talking funny. Pt awakened with a horrible headache and her neck hurt around 930 am.

## 2015-07-29 NOTE — ED Provider Notes (Signed)
CSN: [id number]     Arrival date & time 07/29/15  0016 History  By signing my name below, I, Gonzella Lex, attest that this documentation has been prepared under the direction and in the presence of Tomasita Crumble, MD. Electronically Signed: Gonzella Lex, Scribe. 07/29/2015. 12:50 AM.    No chief complaint on file.  The history is provided by the patient and a relative. No language interpreter was used.    HPI Comments: [name] is a 65 y.o. female who presents to the Emergency Department complaining of onset of constant leg pain and associated difficulty with ambulation secondary to leg pain onset this morning 15 hours ago when she woke around 9:30 AM. She also notes numbness in her right buttock and hip and reports that around 10 AM her vision became blurry in her left eye. She and her son also note onset of intermittent episodes of dysphasia around 37 AM and the pt notes that she has recently had tapeworms present throughout her body. Pt's son noticed that she has been more aggressive than normal today and also states that he noticed her complaints of right hip pain, visual disturbances, slurring of words, and drooping of her right eye. Pt's son is unsure of any changes in pt's mouth.  Pt denies hx of HTN and DM.   Past Medical History  Diagnosis Date  . Anemia   . Thyroid disease   . Chronic pain   . Fibromyalgia   . Chronic kidney disease     surgery to fix  . Lyme disease    Past Surgical History  Procedure Laterality Date  . Avascular necrosis of the hip      multiple operations starting in 2005.  bilateral hips  . Total hip arthroplasty    . Kidney surgery  1971    kidney reconstruction  . Appendectomy  1974  . Cervical fusion  [id number]  . Laminectomy  1985  . Back fusion  1999   Family History  Problem Relation Age of Onset  . Cancer Sister   . Colon cancer Neg Hx   . Stomach cancer Neg Hx   . Esophageal cancer Neg Hx    Social History  Substance  Use Topics  . Smoking status: Never Smoker   . Smokeless tobacco: Never Used  . Alcohol Use: Yes     Comment: occasional   OB History    No data available     Review of Systems  Eyes: Positive for visual disturbance.  Musculoskeletal: Positive for myalgias and arthralgias.       Right leg and hip  Neurological: Positive for facial asymmetry, speech difficulty and numbness.  Psychiatric/Behavioral:       Aggressive behavior  All other systems reviewed and are negative.  Allergies  Doxycycline; Penicillins; and Sulfa antibiotics  Home Medications   Prior to Admission medications   Medication Sig Start Date End Date Taking? Authorizing Provider  benzonatate (TESSALON) 100 MG capsule Take 1 capsule (100 mg total) by mouth every 8 (eight) hours. 04/21/14   Beverely Risen, MD  BuPROPion HCl ER, XL, 450 MG TB24 Take 450 mg by mouth daily.    Historical Provider, MD  Cholecalciferol (VITAMIN D PO) Take 1 capsule by mouth every other day.    Historical Provider, MD  cyclobenzaprine (FLEXERIL) 5 MG tablet Take 5 mg by mouth 3 (three) times daily.  10/16/13   Historical Provider, MD  dexmethylphenidate (FOCALIN XR) 20 MG 24 hr capsule Take 20  mg by mouth 2 (two) times daily. 10/24/13   Charm Rings, MD  dexmethylphenidate (FOCALIN) 10 MG tablet Take 10 mg by mouth daily. 10/24/13   Charm Rings, MD  diclofenac (VOLTAREN) 50 MG EC tablet Take 100 mg by mouth every morning.     Historical Provider, MD  escitalopram (LEXAPRO) 20 MG tablet Take 40 mg by mouth daily.    Historical Provider, MD  L-Methylfolate (DEPLIN) 7.5 MG TABS Take 1 tablet by mouth every morning.    Historical Provider, MD  levothyroxine (SYNTHROID, LEVOTHROID) 125 MCG tablet Take 125 mcg by mouth daily before breakfast.    Historical Provider, MD  methocarbamol (ROBAXIN) 500 MG tablet Take 500 mg by mouth every morning.     Historical Provider, MD  topiramate (TOPAMAX) 100 MG tablet Take 100 mg by mouth daily.    Historical  Provider, MD  VITAMIN K PO Take 2 capsules by mouth daily.    Historical Provider, MD  XANAX 0.25 MG tablet Take 0.25 mg by mouth at bedtime as needed for sleep.  10/22/13   Historical Provider, MD   There were no vitals taken for this visit. Physical Exam  Constitutional: She is oriented to person, place, and time. She appears well-developed and well-nourished. No distress.  HENT:  Head: Normocephalic and atraumatic.  Nose: Nose normal.  Mouth/Throat: Oropharynx is clear and moist. No oropharyngeal exudate.  Eyes: Conjunctivae and EOM are normal. Pupils are equal, round, and reactive to light. No scleral icterus.  Neck: Normal range of motion. Neck supple. No JVD present. No tracheal deviation present. No thyromegaly present.  Cardiovascular: Normal rate, regular rhythm and normal heart sounds.  Exam reveals no gallop and no friction rub.   No murmur heard. Pulmonary/Chest: Effort normal and breath sounds normal. No respiratory distress. She has no wheezes. She exhibits no tenderness.  Abdominal: Soft. Bowel sounds are normal. She exhibits no distension and no mass. There is no tenderness. There is no rebound and no guarding.  Musculoskeletal: Normal range of motion. She exhibits no edema or tenderness.  Lymphadenopathy:    She has no cervical adenopathy.  Neurological: She is alert and oriented to person, place, and time. No cranial nerve deficit. She exhibits normal muscle tone.  Normal strength and sensation in all extremities Normal cerebella testing   Skin: Skin is warm and dry. No rash noted. No erythema. No pallor.  Nursing note and vitals reviewed.   ED Course  Procedures  DIAGNOSTIC STUDIES:  COORDINATION OF CARE:  12:33 AM Will review blood work and CT scan of head. Discussed treatment plan with pt at bedside and pt agreed to plan.   Labs Review Labs Reviewed  COMPREHENSIVE METABOLIC PANEL - Abnormal; Notable for the following:    CO2 19 (*)    Glucose, Bld 100 (*)     Total Protein 6.2 (*)    GFR calc non Af Amer 59 (*)    All other components within normal limits  I-STAT CHEM 8, ED - Abnormal; Notable for the following:    Hemoglobin 15.6 (*)    All other components within normal limits  ETHANOL  PROTIME-INR  APTT  CBC  DIFFERENTIAL  URINE RAPID DRUG SCREEN, HOSP PERFORMED  URINALYSIS, ROUTINE W REFLEX MICROSCOPIC (NOT AT [facility])  Rosezena Sensor, ED    Imaging Review Ct Head Wo Contrast  07/29/2015  ADDENDUM REPORT: 07/29/2015 00:51 ADDENDUM: These results were called by telephone at the time of interpretation on 07/29/2015 at 12:50 am  to Dr. Hosie PoissonSumner , who verbally acknowledged these results. Electronically Signed   By: Rubye OaksMelanie  Ehinger M.D.   On: 07/29/2015 00:51  07/29/2015  CLINICAL DATA:  Code stroke. Right-sided weakness, facial droop and slurred speech. EXAM: CT HEAD WITHOUT CONTRAST TECHNIQUE: Contiguous axial images were obtained from the base of the skull through the vertex without intravenous contrast. COMPARISON:  Head CT 05/15/2014 FINDINGS: No intracranial hemorrhage, mass effect, or midline shift. No hydrocephalus. The basilar cisterns are patent. No evidence of territorial infarct. No intracranial fluid collection. Calvarium is intact. Included paranasal sinuses and mastoid air cells are well aerated. IMPRESSION: No acute intracranial abnormality on noncontrast head CT. Electronically Signed: By: Rubye OaksMelanie  Ehinger M.D. On: 07/29/2015 00:42   Mr Brain Wo Contrast  07/29/2015  CLINICAL DATA:  RIGHT vision changes and RIGHT facial droop, slurred speech and expressive aphasia for 1 hour. Assess for stroke. History of Lyme disease. EXAM: MRI HEAD WITHOUT CONTRAST TECHNIQUE: Multiplanar, multiecho pulse sequences of the brain and surrounding structures were obtained without intravenous contrast. COMPARISON:  CT head July 28, 2014 at 0032 hours and MRI of the brain April 03, 2012 FINDINGS: Multiple sequences are mildly or moderately motion  degraded. The ventricles and sulci are normal for patient's age. No abnormal parenchymal signal, mass lesions, mass effect. No reduced diffusion to suggest acute ischemia. Scattered patchy supratentorial white matter T2 hyperintensities, similar to prior MRI. No susceptibility artifact to suggest hemorrhage. No abnormal extra-axial fluid collections. No extra-axial masses though, contrast enhanced sequences would be more sensitive. Normal major intracranial vascular flow voids seen at the skull base. Ocular globes and orbital contents are unremarkable though not tailored for evaluation. No abnormal sellar expansion. No suspicious calvarial bone marrow signal. Craniocervical junction maintained. Visualized paranasal sinuses and mastoid air cells are well-aerated. Focal upper cervical kyphosis associated with possible stenosis at C3-4, unchanged. IMPRESSION: No acute intracranial process on this motion degraded examination. Mild chronic small vessel ischemic disease. Electronically Signed   By: Awilda Metroourtnay  Bloomer M.D.   On: 07/29/2015 03:12   I have personally reviewed and evaluated these images and lab results as part of my medical decision-making.  MDM   Final diagnoses:  None   Patient presents emergency department for strokelike symptoms. Her history is not consistent with an acute ischemic stroke. I discussed the case with Dr. Hosie PoissonSumner who agrees. Will obtain MRI of the brain for evaluation the emergency department. Per his recommendations, if MRI is negative patient is safe for discharge home. Currently her neurological exam is normal.  MRI negative, I believe patient is safe to go home at this point and follow up with PCP within 3 days.  Her neurologic exam is still normal. She appears well and in  NAD.  VS remain within her normal limits and she is safe for Dc.   I personally performed the services described in this documentation, which was scribed in my presence. The recorded information has been  reviewed and is accurate.      Tomasita CrumbleAdeleke Rudi Bunyard, MD 07/29/15 [phone]

## 2015-07-29 NOTE — ED Notes (Signed)
OTF to MRI 

## 2015-07-29 NOTE — ED Notes (Signed)
[facility]- pt presents with right vision changes and right facial droop, also slurred speech and expressive aphasia x1 hour. Pt son was with her and witnessed a sudden change. Code stroke activated, pt taken to have airway cleared then CT.

## 2015-07-29 NOTE — Consult Note (Signed)
Stroke Consult Consulting Physician: Dr Mora Bellman ED  Chief Complaint: speech deficits and right sided weakness HPI: [name] is an 65 y.o. female hx of fibromyalgia presenting with right visual deficits, right sided weakness and speech deficits. She reports waking up at 0900 and noticing blurred vision in her right eye. Shortly after she developed difficulty using her right leg. These symptoms persisted throughout the day and did not fully resolve. Around 2330 she noted difficulty with her speech, son notes possible slurred speech. She also describes a ptosis in her right eye. Symptoms appear to have improved upon arrival to ED but she reports she is not back to her baseline. She notes multiple other concerns, including a reported parasite.   CT head imaging reviewed and no acute process.   Date last known well: 07/27/2015 Time last known well: unclear tPA Given: no, outside of tPA window Modified Rankin: Rankin Score=0  Past Medical History  Diagnosis Date  . Anemia   . Thyroid disease   . Chronic pain   . Fibromyalgia   . Chronic kidney disease     surgery to fix  . Lyme disease     Past Surgical History  Procedure Laterality Date  . Avascular necrosis of the hip      multiple operations starting in 2005.  bilateral hips  . Total hip arthroplasty    . Kidney surgery  1971    kidney reconstruction  . Appendectomy  1974  . Cervical fusion  [id number]  . Laminectomy  1985  . Back fusion  1999    Family History  Problem Relation Age of Onset  . Cancer Sister   . Colon cancer Neg Hx   . Stomach cancer Neg Hx   . Esophageal cancer Neg Hx    Social History:  reports that she has never smoked. She has never used smokeless tobacco. She reports that she drinks alcohol. She reports that she does not use illicit drugs.  Allergies:  Allergies  Allergen Reactions  . Doxycycline Nausea And Vomiting  . Penicillins Hives  . Sulfa Antibiotics     Per pt: unknown     (Not in a  hospital admission)  ROS: Out of a complete 14 system review, the patient complains of only the following symptoms, and all other reviewed systems are negative. +weakness, speech deficits  Physical Examination: There were no vitals filed for this visit. Physical Exam  Constitutional: He appears well-developed and well-nourished.  Psych: Affect appropriate to situation Eyes: No scleral injection HENT: No OP obstrucion Head: Normocephalic.  Cardiovascular: Normal rate and regular rhythm.  Respiratory: Effort normal and breath sounds normal.  GI: Soft. Bowel sounds are normal. No distension. There is no tenderness.  Skin: WDI   Neurologic Examination: Mental Status: Alert, oriented, thought content appropriate.  Speech fluent without evidence of aphasia. No dysarthria.  Able to follow 3 step commands without difficulty. Cranial Nerves: II: funduscopic exam wnl bilaterally, visual fields grossly normal, pupils equal, round, reactive to light and accommodation III,IV, VI: ptosis not present, extra-ocular motions intact bilaterally V,VII: smile symmetric, facial light touch sensation normal bilaterally VIII: hearing normal bilaterally IX,X: gag reflex present XI: trapezius strength/neck flexion strength normal bilaterally XII: tongue strength normal  Motor: Right : Upper extremity    Left:     Upper extremity 5-/5 deltoid       5/5 deltoid 5-/5 biceps      5/5 biceps  5-/5 triceps      5/5 triceps 5-/5 hand  grip      5/5 hand grip * effort dependent weakness of RUE  Lower extremity     Lower extremity 5/5 hip flexor      5/5 hip flexor 5/5 quadricep      5/5 quadriceps  5/5 hamstrings     5/5 hamstrings 5/5 plantar flexion       5/5 plantar flexion 5/5 plantar extension     5/5 plantar extension Tone and bulk:normal tone throughout; no atrophy noted Sensory: decreased sensation of lateral surface of bilateral LE Deep Tendon Reflexes: 2+ and symmetric  throughout Plantars: Right: downgoing   Left: downgoing Cerebellar: normal finger-to-nose, and normal heel-to-shin test Gait: deferred  Laboratory Studies:   Basic Metabolic Panel:  Recent Labs Lab 07/29/15 0030  NA 142  K 3.6  CL 108  GLUCOSE 95  BUN 18  CREATININE 0.90    Liver Function Tests: No results for input(s): AST, ALT, ALKPHOS, BILITOT, PROT, ALBUMIN in the last 168 hours. No results for input(s): LIPASE, AMYLASE in the last 168 hours. No results for input(s): AMMONIA in the last 168 hours.  CBC:  Recent Labs Lab 07/29/15 0025 07/29/15 0030  WBC 8.0  --   NEUTROABS 4.1  --   HGB 14.6 15.6*  HCT 43.2 46.0  MCV 93.5  --   PLT 266  --     Cardiac Enzymes: No results for input(s): CKTOTAL, CKMB, CKMBINDEX, TROPONINI in the last 168 hours.  BNP: Invalid input(s): POCBNP  CBG: No results for input(s): GLUCAP in the last 168 hours.  Microbiology: No results found for this or any previous visit.  Coagulation Studies:  Recent Labs  07/29/15 0025  LABPROT 14.0  INR 1.06    Urinalysis: No results for input(s): COLORURINE, LABSPEC, PHURINE, GLUCOSEU, HGBUR, BILIRUBINUR, KETONESUR, PROTEINUR, UROBILINOGEN, NITRITE, LEUKOCYTESUR in the last 168 hours.  Invalid input(s): APPERANCEUR  Lipid Panel:  No results found for: CHOL, TRIG, HDL, CHOLHDL, VLDL, LDLCALC  HgbA1C: No results found for: HGBA1C  Urine Drug Screen:  No results found for: LABOPIA, COCAINSCRNUR, LABBENZ, AMPHETMU, THCU, LABBARB  Alcohol Level: No results for input(s): ETH in the last 168 hours.  Other results:  Imaging: Ct Head Wo Contrast  07/29/2015  CLINICAL DATA:  Code stroke. Right-sided weakness, facial droop and slurred speech. EXAM: CT HEAD WITHOUT CONTRAST TECHNIQUE: Contiguous axial images were obtained from the base of the skull through the vertex without intravenous contrast. COMPARISON:  Head CT 05/15/2014 FINDINGS: No intracranial hemorrhage, mass effect, or midline  shift. No hydrocephalus. The basilar cisterns are patent. No evidence of territorial infarct. No intracranial fluid collection. Calvarium is intact. Included paranasal sinuses and mastoid air cells are well aerated. IMPRESSION: No acute intracranial abnormality on noncontrast head CT. Electronically Signed   By: Rubye OaksMelanie  Ehinger M.D.   On: 07/29/2015 00:42    Assessment: 65 y.o. female hx of fibromyalgia presenting with hx of blurred vision in right eye, speech deficits and ptosis. Symptoms have appeared to resolve though she does have some effort dependent weakness of her RUE. Cannot rule out CVA though presentation atypical for this.  -suggest checking MRI brain. If positive would complete remainder of stroke workup. If negative no further neurological workup indicated at this time -ASA 325mg  daily     Elspeth Choeter Carmella Kees, DO [facility] [phone]  If 7pm- 7am, please page neurology on call as listed in AMION. 07/29/2015, 12:51 AM

## 2015-07-29 NOTE — ED Notes (Signed)
Code Stroke cancelled @ 0047.

## 2015-07-29 NOTE — ED Provider Notes (Signed)
MSE was initiated and I personally evaluated the patient and placed orders (if any) at  12:26 AM on July 29, 2015.  The patient appears stable so that the remainder of the MSE may be completed by another provider.  Patient noted to have right eye droop which had not been present 2 hours ago. She does not show any obvious facial droop or cranial nerve findings. There is mild weakness of the right arm. Code stroke is activated and she sent for CT scan.  Dione Boozeavid Ronisha Herringshaw, MD 07/29/15 [phone]

## 2015-07-29 NOTE — Discharge Instructions (Signed)
Weakness Ms. [name], see a primary care doctor within3  Days for close follow up of your weakness.  For any worsening symptoms, come back to the ED immediately. Thank you. Weakness is a lack of strength. You may feel weak all over your body or just in one part of your body. Weakness can be serious. In some cases, you may need more medical tests. HOME CARE  Rest.  Eat a well-balanced diet.  Try to exercise every day.  Only take medicines as told by your doctor. GET HELP RIGHT AWAY IF:   You cannot do your normal daily activities.  You cannot walk up and down stairs, or you feel very tired when you do so.  You have shortness of breath or chest pain.  You have trouble moving parts of your body.  You have weakness in only one body part or on only one side of the body.  You have a fever.  You have trouble speaking or swallowing.  You cannot control when you pee (urinate) or poop (bowel movement).  You have black or bloody throw up (vomit) or poop.  Your weakness gets worse or spreads to other body parts.  You have new aches or pains. MAKE SURE YOU:   Understand these instructions.  Will watch your condition.  Will get help right away if you are not doing well or get worse.   This information is not intended to replace advice given to you by your health care provider. Make sure you discuss any questions you have with your health care provider.   Document Released: 06/17/2008 Document Revised: 01/04/2012 Document Reviewed: 09/03/2011 Elsevier Interactive Patient Education [employer]2016 [employer] Inc.

## 2016-05-17 ENCOUNTER — Other Ambulatory Visit: Payer: Self-pay | Admitting: Nurse Practitioner

## 2016-05-17 DIAGNOSIS — G43809 Other migraine, not intractable, without status migrainosus: Secondary | ICD-10-CM

## 2016-05-17 DIAGNOSIS — M542 Cervicalgia: Secondary | ICD-10-CM

## 2016-06-08 ENCOUNTER — Inpatient Hospital Stay: Admission: RE | Admit: 2016-06-08 | Payer: Medicare Other | Source: Ambulatory Visit

## 2016-06-08 ENCOUNTER — Other Ambulatory Visit: Payer: Medicare Other

## 2016-08-27 ENCOUNTER — Other Ambulatory Visit: Payer: Self-pay | Admitting: Nurse Practitioner

## 2016-08-27 DIAGNOSIS — R200 Anesthesia of skin: Secondary | ICD-10-CM

## 2016-08-27 DIAGNOSIS — R52 Pain, unspecified: Secondary | ICD-10-CM

## 2016-08-31 ENCOUNTER — Ambulatory Visit
Admission: RE | Admit: 2016-08-31 | Discharge: 2016-08-31 | Disposition: A | Discharge status: Home / Self Care | Payer: Medicare Other | Source: Ambulatory Visit | Attending: Nurse Practitioner | Admitting: Nurse Practitioner

## 2016-08-31 DIAGNOSIS — R52 Pain, unspecified: Secondary | ICD-10-CM

## 2016-08-31 DIAGNOSIS — R200 Anesthesia of skin: Secondary | ICD-10-CM

## 2016-11-26 IMAGING — MR MR HEAD W/O CM
8 of 10 series · 36 of 48 positions shown · non-contrast
Comparison: CT head July 28, 2014 at 7777 hours and MRI of the
brain April 03, 2012

CLINICAL DATA: RIGHT vision changes and RIGHT facial droop, slurred
speech and expressive aphasia for 1 hour. Assess for stroke. History
of Lyme disease.

EXAM:
MRI HEAD WITHOUT CONTRAST
TECHNIQUE: Multiplanar, multiecho pulse sequences of the brain and surrounding
structures were obtained without intravenous contrast.

[Series 2: FLAIR · sagittal · 5.0mm · 0.47mm/px · 3 of 23 slices shown (1 of 2)]
[im 1/23]
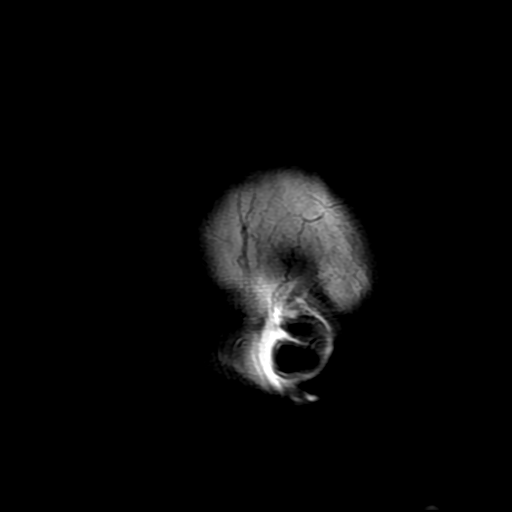
[im 12/23]
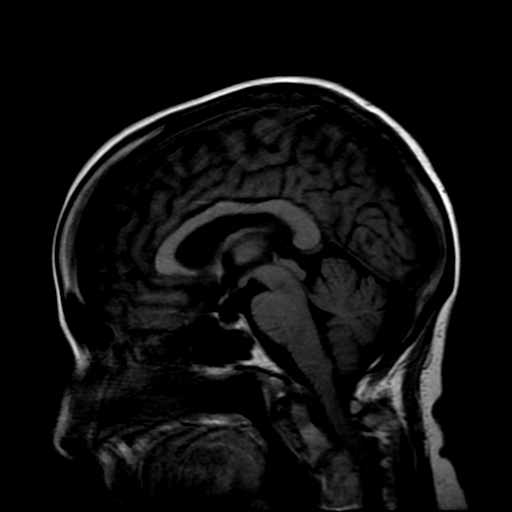
[im 23/23]
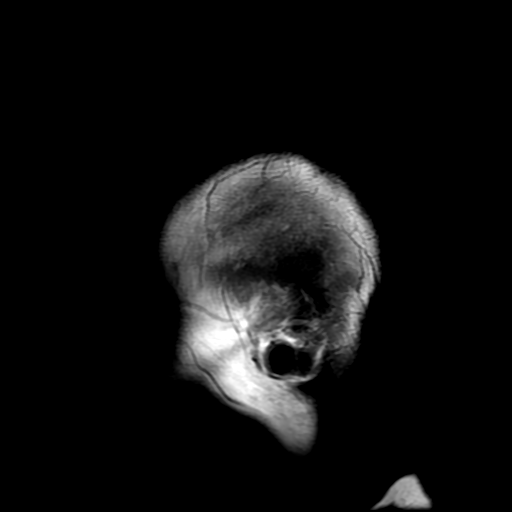

[Series 4: DWI · axial · 3.6mm · 0.94mm/px · z∈[-92,+52]mm · 8 of 84 slices shown (1 of 4)]
[im 1/84]
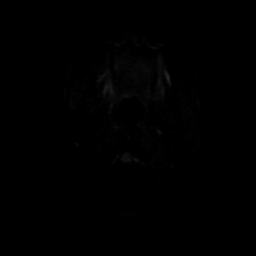
[im 10/84]
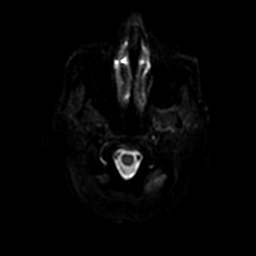
[im 28/84]
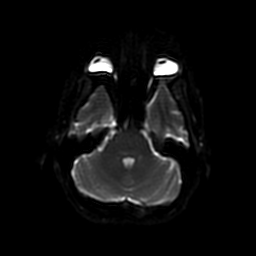
[im 37/84]
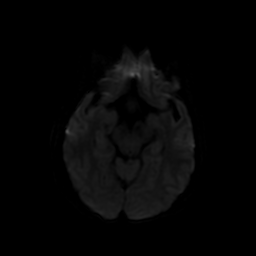
[im 47/84]
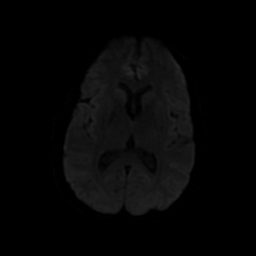
[im 56/84]
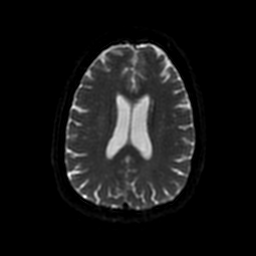
[im 74/84]
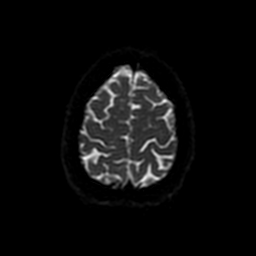
[im 84/84]
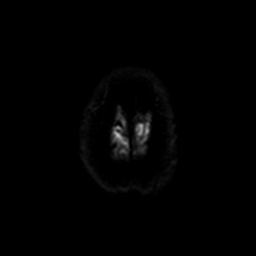

[Series 5: T2 · axial · 5.0mm · 0.47mm/px · z∈[-92,+54]mm · 3 of 26 slices shown (1 of 2)]
[im 1/26]
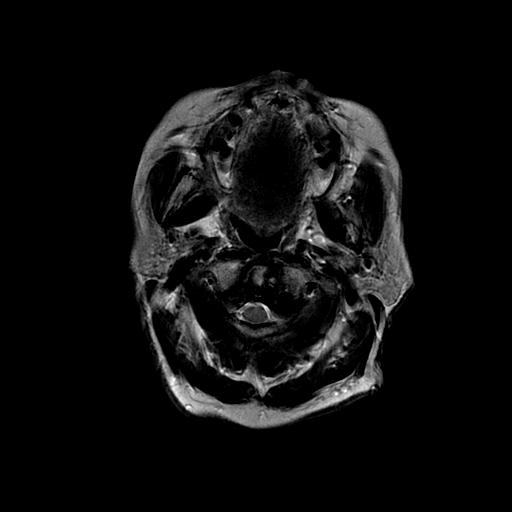
[im 13/26]
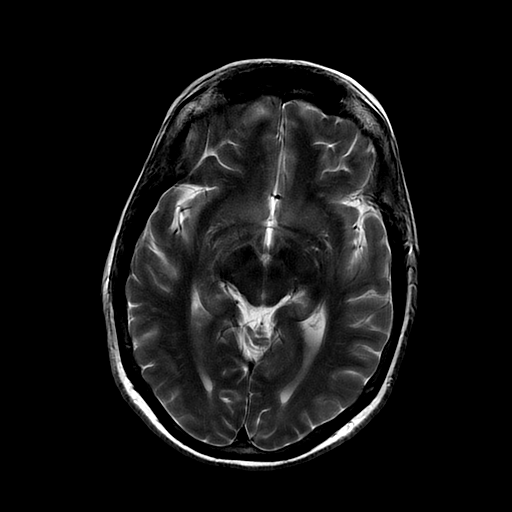
[im 26/26]
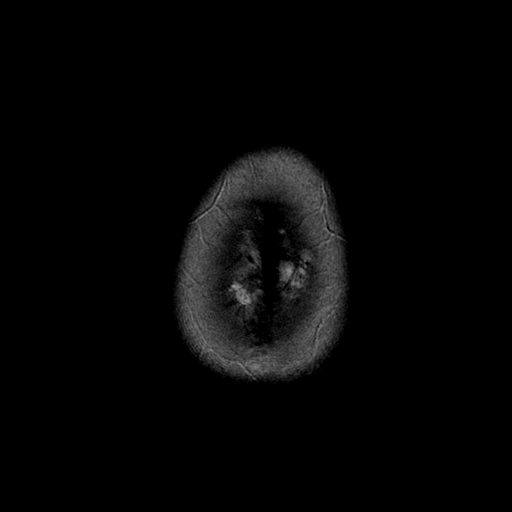

[Series 6: FLAIR · axial · 5.0mm · 0.47mm/px · z∈[-92,+54]mm · 3 of 26 slices shown (2 of 2)]
[im 1/26]
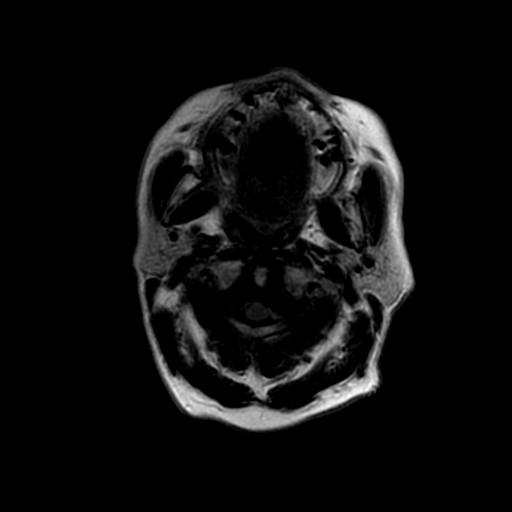
[im 13/26]
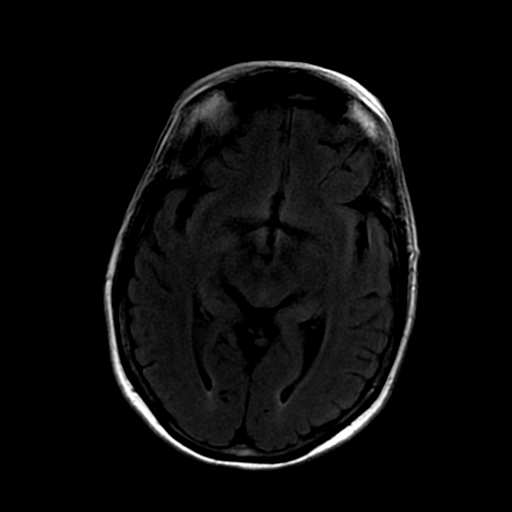
[im 26/26]
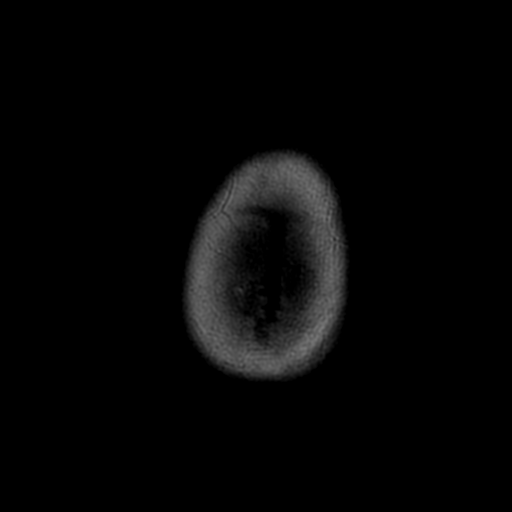

[Series 7: DWI · coronal · 5.0mm · 0.94mm/px · 8 of 70 slices shown (2 of 4)]
[im 1/70]
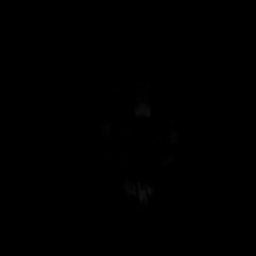
[im 10/70]
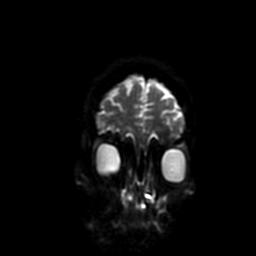
[im 20/70]
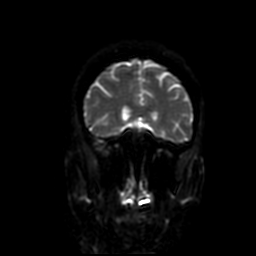
[im 30/70]
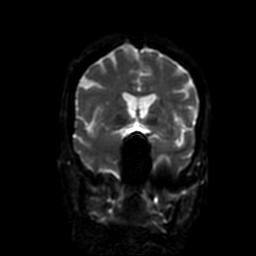
[im 40/70]
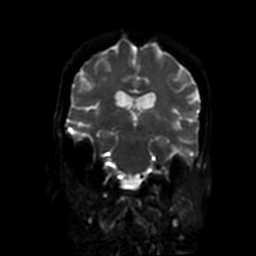
[im 50/70]
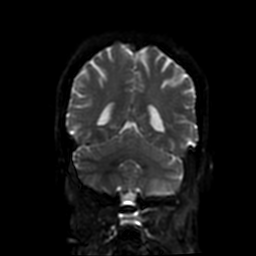
[im 60/70]
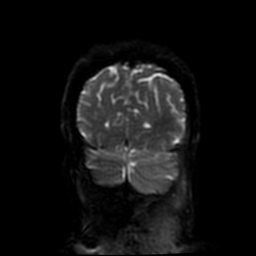
[im 70/70]
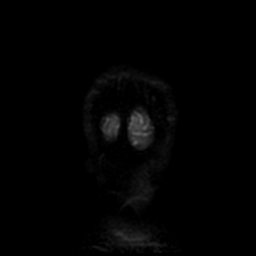

[Series 10: T2 · coronal · 5.0mm · 0.47mm/px · 2 of 30 slices shown (2 of 2)]
[im 1/30]
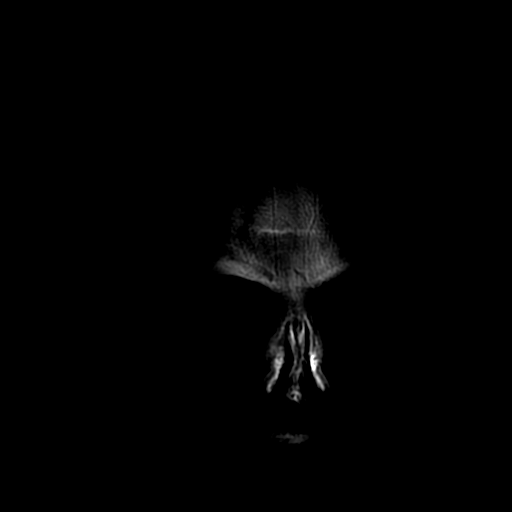
[im 15/30]
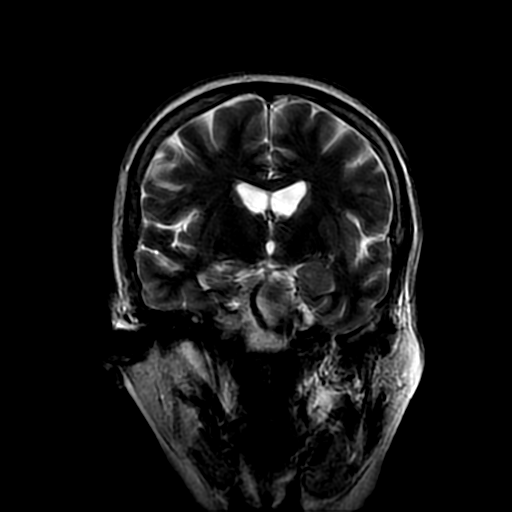

[Series 400: DWI · axial · 3.6mm · 0.94mm/px · z∈[-92,+52]mm · 5 of 42 slices shown (3 of 4)]
[im 1/42]
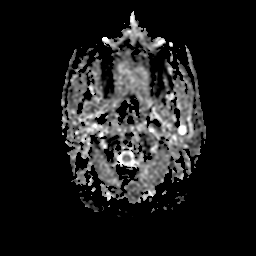
[im 11/42]
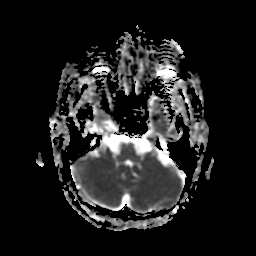
[im 21/42]
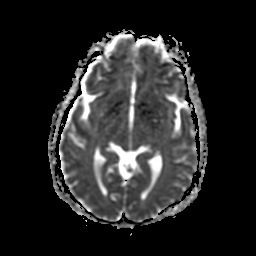
[im 31/42]
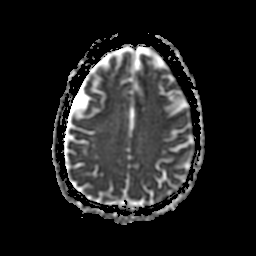
[im 42/42]
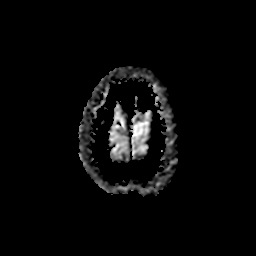

[Series 700: DWI · coronal · 5.0mm · 0.94mm/px · 4 of 35 slices shown (4 of 4)]
[im 1/35]
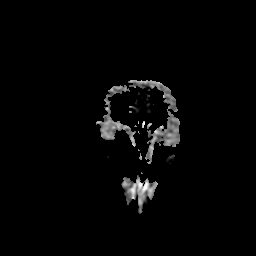
[im 12/35]
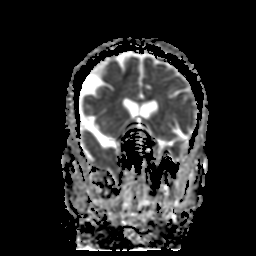
[im 23/35]
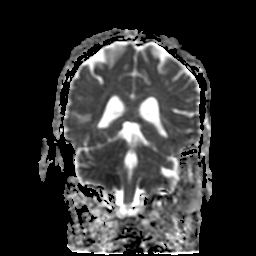
[im 35/35]
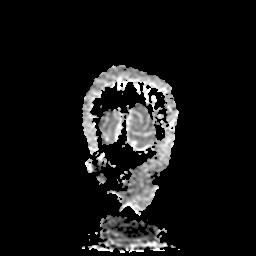

[36 of 48 positions shown; findings below may reference images not displayed]

FINDINGS: Multiple sequences are mildly or moderately motion degraded.

The ventricles and sulci are normal for patient's age. No abnormal
parenchymal signal, mass lesions, mass effect. No reduced diffusion
to suggest acute ischemia. Scattered patchy supratentorial white
matter T2 hyperintensities, similar to prior MRI. No susceptibility
artifact to suggest hemorrhage.

No abnormal extra-axial fluid collections. No extra-axial masses
though, contrast enhanced sequences would be more sensitive. Normal
major intracranial vascular flow voids seen at the skull base.

Ocular globes and orbital contents are unremarkable though not
tailored for evaluation. No abnormal sellar expansion. No suspicious
calvarial bone marrow signal. Craniocervical junction maintained.
Visualized paranasal sinuses and mastoid air cells are well-aerated.
Focal upper cervical kyphosis associated with possible stenosis at
C3-4, unchanged.
IMPRESSION: No acute intracranial process on this motion degraded examination.

Mild chronic small vessel ischemic disease.

## 2017-01-26 DIAGNOSIS — Z889 Allergy status to unspecified drugs, medicaments and biological substances status: Secondary | ICD-10-CM | POA: Insufficient documentation

## 2017-01-26 DIAGNOSIS — T84018A Broken internal joint prosthesis, other site, initial encounter: Secondary | ICD-10-CM | POA: Insufficient documentation

## 2017-01-26 DIAGNOSIS — Z96649 Presence of unspecified artificial hip joint: Secondary | ICD-10-CM | POA: Insufficient documentation

## 2017-08-12 DIAGNOSIS — F419 Anxiety disorder, unspecified: Secondary | ICD-10-CM | POA: Insufficient documentation

## 2017-08-12 DIAGNOSIS — M545 Low back pain, unspecified: Secondary | ICD-10-CM | POA: Insufficient documentation

## 2017-08-12 DIAGNOSIS — G8929 Other chronic pain: Secondary | ICD-10-CM | POA: Insufficient documentation

## 2017-08-12 DIAGNOSIS — Z96649 Presence of unspecified artificial hip joint: Secondary | ICD-10-CM | POA: Insufficient documentation

## 2017-09-05 ENCOUNTER — Other Ambulatory Visit (HOSPITAL_COMMUNITY): Payer: Self-pay | Admitting: Ophthalmology

## 2017-09-05 DIAGNOSIS — I6523 Occlusion and stenosis of bilateral carotid arteries: Secondary | ICD-10-CM

## 2017-09-09 ENCOUNTER — Ambulatory Visit (HOSPITAL_COMMUNITY): Admission: RE | Admit: 2017-09-09 | Payer: Medicare Other | Source: Ambulatory Visit

## 2017-09-14 ENCOUNTER — Ambulatory Visit (HOSPITAL_COMMUNITY)
Admission: RE | Admit: 2017-09-14 | Payer: Medicare Other | Source: Ambulatory Visit | Attending: Ophthalmology | Admitting: Ophthalmology

## 2017-09-14 ENCOUNTER — Other Ambulatory Visit (HOSPITAL_COMMUNITY): Payer: Self-pay | Admitting: Ophthalmology

## 2017-09-14 DIAGNOSIS — I6523 Occlusion and stenosis of bilateral carotid arteries: Secondary | ICD-10-CM

## 2017-09-14 DIAGNOSIS — G453 Amaurosis fugax: Secondary | ICD-10-CM

## 2017-09-21 ENCOUNTER — Ambulatory Visit (HOSPITAL_COMMUNITY)
Admission: RE | Admit: 2017-09-21 | Discharge: 2017-09-21 | Disposition: A | Discharge status: Home / Self Care | Payer: Medicare Other | Source: Ambulatory Visit | Attending: Cardiovascular Disease | Admitting: Cardiovascular Disease

## 2017-09-21 DIAGNOSIS — G453 Amaurosis fugax: Secondary | ICD-10-CM | POA: Diagnosis present

## 2017-09-21 DIAGNOSIS — I6523 Occlusion and stenosis of bilateral carotid arteries: Secondary | ICD-10-CM | POA: Diagnosis not present

## 2017-10-07 ENCOUNTER — Other Ambulatory Visit: Payer: Self-pay

## 2017-10-07 ENCOUNTER — Emergency Department (HOSPITAL_COMMUNITY)
Admission: EM | Admit: 2017-10-07 | Discharge: 2017-10-07 | Disposition: A | Discharge status: Home / Self Care | Payer: Medicare Other | Attending: Emergency Medicine | Admitting: Emergency Medicine

## 2017-10-07 ENCOUNTER — Emergency Department (HOSPITAL_COMMUNITY): Payer: Medicare Other

## 2017-10-07 ENCOUNTER — Encounter (HOSPITAL_COMMUNITY): Payer: Self-pay | Admitting: *Deleted

## 2017-10-07 DIAGNOSIS — N189 Chronic kidney disease, unspecified: Secondary | ICD-10-CM | POA: Diagnosis not present

## 2017-10-07 DIAGNOSIS — R51 Headache: Secondary | ICD-10-CM

## 2017-10-07 DIAGNOSIS — R519 Headache, unspecified: Secondary | ICD-10-CM

## 2017-10-07 DIAGNOSIS — B350 Tinea barbae and tinea capitis: Secondary | ICD-10-CM | POA: Diagnosis not present

## 2017-10-07 DIAGNOSIS — Z79899 Other long term (current) drug therapy: Secondary | ICD-10-CM | POA: Insufficient documentation

## 2017-10-07 DIAGNOSIS — Z96649 Presence of unspecified artificial hip joint: Secondary | ICD-10-CM | POA: Diagnosis not present

## 2017-10-07 LAB — DIFFERENTIAL
Basophils Absolute: 0.1 10*3/uL (ref 0.0–0.1)
Basophils Relative: 1 %
Eosinophils Absolute: 0.3 10*3/uL (ref 0.0–0.7)
Eosinophils Relative: 3 %
Lymphocytes Relative: 18 %
Lymphs Abs: 1.4 10*3/uL (ref 0.7–4.0)
Monocytes Absolute: 0.7 10*3/uL (ref 0.1–1.0)
Monocytes Relative: 10 %
Neutro Abs: 5.3 10*3/uL (ref 1.7–7.7)
Neutrophils Relative %: 68 %

## 2017-10-07 LAB — CBC
HCT: 41.9 % (ref 36.0–46.0)
Hemoglobin: 13.9 g/dL (ref 12.0–15.0)
MCH: 30.5 pg (ref 26.0–34.0)
MCHC: 33.2 g/dL (ref 30.0–36.0)
MCV: 92.1 fL (ref 78.0–100.0)
Platelets: 297 10*3/uL (ref 150–400)
RBC: 4.55 MIL/uL (ref 3.87–5.11)
RDW: 13.3 % (ref 11.5–15.5)
WBC: 7.7 10*3/uL (ref 4.0–10.5)

## 2017-10-07 LAB — COMPREHENSIVE METABOLIC PANEL
ALT: 19 U/L (ref 14–54)
AST: 21 U/L (ref 15–41)
Albumin: 4.2 g/dL (ref 3.5–5.0)
Alkaline Phosphatase: 77 U/L (ref 38–126)
Anion gap: 8 (ref 5–15)
BUN: 23 mg/dL — ABNORMAL HIGH (ref 6–20)
CO2: 22 mmol/L (ref 22–32)
Calcium: 9.4 mg/dL (ref 8.9–10.3)
Chloride: 107 mmol/L (ref 101–111)
Creatinine, Ser: 0.98 mg/dL (ref 0.44–1.00)
GFR calc Af Amer: >60 mL/min (ref >60)
GFR calc non Af Amer: 58 mL/min — ABNORMAL LOW (ref >60)
Glucose, Bld: 106 mg/dL — ABNORMAL HIGH (ref 65–99)
Potassium: 4.2 mmol/L (ref 3.5–5.1)
Sodium: 137 mmol/L (ref 135–145)
Total Bilirubin: 0.6 mg/dL (ref 0.3–1.2)
Total Protein: 6.7 g/dL (ref 6.5–8.1)

## 2017-10-07 LAB — APTT: aPTT: 27 seconds (ref 24–36)

## 2017-10-07 LAB — PROTIME-INR
INR: 1.05
Prothrombin Time: 13.6 seconds (ref 11.4–15.2)

## 2017-10-07 LAB — I-STAT TROPONIN, ED: Troponin i, poc: 0 ng/mL (ref 0.00–0.08)

## 2017-10-07 MED ORDER — GRISEOFULVIN MICROSIZE 500 MG PO TABS: 500.0000 mg | ORAL_TABLET | Freq: Every day | ORAL | 0 refills | Status: AC

## 2017-10-07 MED ORDER — KETOCONAZOLE 2 % EX SHAM: 1.0000 "application " | MEDICATED_SHAMPOO | CUTANEOUS | 0 refills | Status: DC

## 2017-10-07 NOTE — Discharge Instructions (Addendum)
Try griseofulvin daily for a month.   Try ketoconazole shampoo twice a week.   I recommend that you get biopsy from dermatology.

## 2017-10-07 NOTE — ED Notes (Signed)
Patient visitor verbalizes understanding of discharge instructions. Opportunity for questioning and answers were provided. Armband removed by staff, pt discharged from ED.  Pt left w/o allowing this nurse to recheck vitals or review d/c paperwork. This RN reviewed paperwork with pt visitor.

## 2017-10-07 NOTE — ED Provider Notes (Signed)
[facility] EMERGENCY DEPARTMENT Provider Note   CSN: [id number] Arrival date & time: 10/07/17  1619     History   Chief Complaint Chief Complaint  Patient presents with  . Pain  . Aphasia    HPI [name] is a 67 y.o. female history of CKD, fibromyalgia, here presenting with headache, blurry vision, possible parasitic infection.  Patient states that for the last several years, she has been having some changes in her throat and took multiple pictures of it.  Yesterday she states he has some subjective trouble swallowing as well as swelling of her tongue.  She states that the tongue swelling has resolved since then.  Over the last several years, patient noticed some problems with her scalp.  She states that she may have some parasites crawling in and out of her scalp.  Patient actually saw her doctor several days ago and was referred to get an MRI outpatient but did not get it done yet.  Apparently this morning, she had some posterior headaches but denies any nausea or vomiting.  She states that she has some low-grade temperatures at home.  She has some subjective neck pain as well and decided to come to the ED for evaluation.  Patient never had a biopsy that confirmed her parasitic infections.  Patient had previous left hip surgery and has persistent pain in the left hip but states that she was still able to walk on it.   The history is provided by the patient.    Past Medical History:  Diagnosis Date  . Anemia   . Chronic kidney disease    surgery to fix  . Chronic pain   . Fibromyalgia   . Lyme disease   . Thyroid disease     Patient Active Problem List   Diagnosis Date Noted  . Stricture and stenosis of esophagus 11/30/2013  . Throat pain 11/30/2013  . Diverticulosis of colon without hemorrhage 10/29/2013  . 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 APPENDECTOMY  1974  . Avascular  necrosis of the hip     multiple operations starting in 2005.  bilateral hips  . Back fusion  1999  . CERVICAL FUSION  [id number]  . KIDNEY SURGERY  1971   kidney reconstruction  . LAMINECTOMY  1985  . TOTAL HIP ARTHROPLASTY       OB History   None      Home Medications    Prior to Admission medications   Medication Sig Start Date End Date Taking? Authorizing Provider  benzonatate (TESSALON) 100 MG capsule Take 1 capsule (100 mg total) by mouth every 8 (eight) hours. Patient taking differently: Take 100 mg by mouth every 8 (eight) hours as needed for cough.  04/21/14   Beverely Risen, MD  BuPROPion HCl ER, XL, 450 MG TB24 Take 450 mg by mouth daily.    [provider]  Cholecalciferol (VITAMIN D PO) Take 1 capsule by mouth every other day.    [provider]  cyclobenzaprine (FLEXERIL) 5 MG tablet Take 5 mg by mouth 3 (three) times daily.  10/16/13   [provider]  dexmethylphenidate (FOCALIN XR) 20 MG 24 hr capsule Take 20 mg by mouth 2 (two) times daily. 10/24/13   Charm Rings, MD  dexmethylphenidate (FOCALIN) 10 MG tablet Take 10 mg by mouth daily as needed (if second dose of  capsule isn't taken).  10/24/13   Randal Buba  J, MD  diclofenac (VOLTAREN) 50 MG EC tablet Take 100 mg by mouth every morning.     [provider]  escitalopram (LEXAPRO) 20 MG tablet Take 40 mg by mouth daily.    [provider]  methocarbamol (ROBAXIN) 500 MG tablet Take 500 mg by mouth every morning.     [provider]  NONFORMULARY OR COMPOUNDED ITEM Take 1 capsule by mouth daily. M.H.B    [provider]  OVER THE COUNTER MEDICATION Take 1 capsule by mouth daily. N.A.C    [provider]  saccharomyces boulardii (FLORASTOR) 250 MG capsule Take 250 mg by mouth daily.    [provider]  thyroid (NATURE-THROID) 65 MG tablet Take 65 mg by mouth daily.    [provider]  topiramate (TOPAMAX) 100 MG tablet Take 100 mg  by mouth every morning.     [provider]  VITAMIN K PO Take 2 capsules by mouth daily.    [provider]  XANAX 0.25 MG tablet Take 0.25 mg by mouth at bedtime as needed for sleep.  10/22/13   [provider]    Family History Family History  Problem Relation Age of Onset  . Cancer Sister   . Colon cancer Neg Hx   . Stomach cancer Neg Hx   . Esophageal cancer Neg Hx     Social History Social History   Tobacco Use  . Smoking status: Never Smoker  . Smokeless tobacco: Never Used  Substance Use Topics  . Alcohol use: Yes    Comment: occasional  . Drug use: No     Allergies   Doxycycline; Penicillins; and Sulfa antibiotics   Review of Systems Review of Systems  HENT: Positive for sore throat.   Skin: Positive for wound.  All other systems reviewed and are negative.    Physical Exam Updated Vital Signs BP (!) 119/93 (BP Location: Right Arm)   Pulse 100   Temp 98 F (36.7 C) (Oral)   Resp 18   SpO2 98%   Physical Exam  Constitutional: She is oriented to person, place, and time.  Anxious   HENT:  Head: Normocephalic.  ? Round scaling with central clearing, ? Missing some hair around it. OP clear with uvula midline and no enlarged tonsils   Eyes: Pupils are equal, round, and reactive to light. Conjunctivae and EOM are normal.  Neck: Normal range of motion. Neck supple.  Cardiovascular: Normal rate, regular rhythm and normal heart sounds.  Pulmonary/Chest: Effort normal and breath sounds normal. No stridor. No respiratory distress.  Abdominal: Soft. Bowel sounds are normal. She exhibits no distension. There is no tenderness.  Musculoskeletal: Normal range of motion.  L hip surgery site healing well with no signs of cellulitis, nl ROM L hip   Neurological: She is alert and oriented to person, place, and time. No cranial nerve deficit. Coordination normal.  CN 2- 12 intact, nl strength throughout, nl sensation throughout   Skin: Skin is  warm.  Psychiatric: She has a normal mood and affect.  Nursing note and vitals reviewed.    ED Treatments / Results  Labs (all labs ordered are listed, but only abnormal results are displayed) Labs Reviewed  COMPREHENSIVE METABOLIC PANEL - Abnormal; Notable for the following components:      Result Value   Glucose, Bld 106 (*)    BUN 23 (*)    GFR calc non Af Amer 58 (*)    All other components within normal  limits  PROTIME-INR  APTT  CBC  DIFFERENTIAL  I-STAT TROPONIN, ED    EKG None  Radiology Ct Head Wo Contrast  Result Date: 10/07/2017 CLINICAL DATA:  Posterior headache and dizziness.  Fever. EXAM: CT HEAD WITHOUT CONTRAST TECHNIQUE: Contiguous axial images were obtained from the base of the skull through the vertex without intravenous contrast. COMPARISON:  07/29/2015 FINDINGS: Brain: Ventricles, cisterns and other CSF spaces are normal. There is no mass, mass effect, shift of midline structures or acute hemorrhage. No evidence of acute infarction. Vascular: No hyperdense vessel or unexpected calcification. Skull: Normal. Negative for fracture or focal lesion. Sinuses/Orbits: No acute finding. Other: None. IMPRESSION: No acute findings. Electronically Signed   By: Elberta Fortis M.D.   On: 10/07/2017 19:34    Procedures Procedures (including critical care time)  Medications Ordered in ED Medications - No data to display   Initial Impression / Assessment and Plan / ED Course  I have reviewed the triage vital signs and the nursing notes.  Pertinent labs & imaging results that were available during my care of the patient were reviewed by me and considered in my medical decision making (see chart for details).     [name] is a 67 y.o. female here with multiple complaints. She is concerned that she may have a parasitic infection. However, this has been going on for many months. She also has some discoloration of her throat for many months. Did had some headaches  yesterday but they are improved. She has nl neuro exam. She has subjective neck pain but no meningeal signs and is afebrile and has no leukocytosis. Her CT head showed no bleed and I doubt SAH or stroke. She was also concerned for persistent L hip pain after surgery last year but the scars are healing well and she is able to range the hip. I doubt any bacterial infection currently. I see possibly some signs of tinea capitis on her scalp. I recommend ketoconazole shampoo and griseofulvin. If she wants to diagnose the parasitic infection, I recommend biopsy by dermatology. She states that she has connections and can get into [facility]. Stable for discharge   Final Clinical Impressions(s) / ED Diagnoses   Final diagnoses:  None    ED Discharge Orders    None       Charlynne Pander, MD 10/07/17 2107

## 2017-10-07 NOTE — ED Triage Notes (Signed)
Pt has multiple complaints. Very anxious at triage. States she has hx of infectious disease and states she is having frequent headaches, "feels like something is in back of her throat", reports discoloration to her neck and reports episodes of aphasia x 6 months. No acute distress is noted at triage.

## 2017-12-29 ENCOUNTER — Ambulatory Visit: Payer: Medicare Other | Admitting: Internal Medicine

## 2018-01-02 ENCOUNTER — Encounter (HOSPITAL_COMMUNITY): Payer: Self-pay | Admitting: Psychiatry

## 2018-01-02 ENCOUNTER — Ambulatory Visit (INDEPENDENT_AMBULATORY_CARE_PROVIDER_SITE_OTHER): Payer: Medicare Other | Admitting: Psychiatry

## 2018-01-02 ENCOUNTER — Encounter

## 2018-01-02 VITALS — BP 128/72 | HR 66 | Ht 60.0 in | Wt 134.0 lb

## 2018-01-02 DIAGNOSIS — F411 Generalized anxiety disorder: Secondary | ICD-10-CM

## 2018-01-02 MED ORDER — BUSPIRONE HCL 5 MG PO TABS: 5.0000 mg | ORAL_TABLET | Freq: Two times a day (BID) | ORAL | 0 refills | Status: DC
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
## 2018-01-10 ENCOUNTER — Other Ambulatory Visit (HOSPITAL_COMMUNITY): Payer: Self-pay | Admitting: Psychiatry

## 2018-01-16 ENCOUNTER — Telehealth: Payer: Self-pay

## 2018-01-16 ENCOUNTER — Encounter: Payer: Self-pay | Admitting: Internal Medicine

## 2018-01-16 ENCOUNTER — Ambulatory Visit (INDEPENDENT_AMBULATORY_CARE_PROVIDER_SITE_OTHER): Payer: Medicare Other | Admitting: Internal Medicine

## 2018-01-16 VITALS — BP 144/67 | HR 97 | Temp 99.3°F | Wt 132.0 lb

## 2018-01-16 DIAGNOSIS — I6523 Occlusion and stenosis of bilateral carotid arteries: Secondary | ICD-10-CM | POA: Diagnosis not present

## 2018-01-16 DIAGNOSIS — F22 Delusional disorders: Secondary | ICD-10-CM

## 2018-01-16 DIAGNOSIS — B350 Tinea barbae and tinea capitis: Secondary | ICD-10-CM | POA: Diagnosis not present

## 2018-01-16 NOTE — Telephone Encounter (Signed)
Called pt to inform her that she left her specimen cup at our office which she needs to do one of her labs Dr. Drue SecondSnider ordered. Left a vm for pt to pick up the specimen cup at our office when she can make It back between 8-5 Monday- Thursday or Friday 8-12:30. Lorenso CourierJose L Pete Schnitzer, [place]
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
## 2018-01-20 LAB — CBC WITH DIFFERENTIAL/PLATELET
Basophils Absolute: 72 cells/uL (ref 0–200)
Basophils Relative: 0.9 %
Eosinophils Absolute: 344 cells/uL (ref 15–500)
Eosinophils Relative: 4.3 %
HCT: 43.2 % (ref 35.0–45.0)
Hemoglobin: 14.9 g/dL (ref 11.7–15.5)
Lymphs Abs: 1880 cells/uL (ref 850–3900)
MCH: 31.4 pg (ref 27.0–33.0)
MCHC: 34.5 g/dL (ref 32.0–36.0)
MCV: 91.1 fL (ref 80.0–100.0)
MPV: 10 fL (ref 7.5–12.5)
Monocytes Relative: 10.3 %
Neutro Abs: 4880 cells/uL (ref 1500–7800)
Neutrophils Relative %: 61 %
Platelets: 352 10*3/uL (ref 140–400)
RBC: 4.74 10*6/uL (ref 3.80–5.10)
RDW: 12.3 % (ref 11.0–15.0)
Total Lymphocyte: 23.5 %
WBC mixed population: 824 cells/uL (ref 200–950)
WBC: 8 10*3/uL (ref 3.8–10.8)

## 2018-01-20 LAB — STRONGYLOIDES ANTIBODY: Strongyloides IgG Antibody, ELISA: NEGATIVE

## 2018-01-23 ENCOUNTER — Ambulatory Visit (HOSPITAL_COMMUNITY): Payer: Self-pay | Admitting: Licensed Clinical Social Worker

## 2018-01-31 ENCOUNTER — Telehealth: Payer: Self-pay

## 2018-01-31 ENCOUNTER — Telehealth: Payer: Self-pay | Admitting: Internal Medicine

## 2018-01-31 NOTE — Telephone Encounter (Signed)
Will send to ShoshoneLuis, who is working with this patient.

## 2018-01-31 NOTE — Telephone Encounter (Signed)
Pt called today with complaints regarding referral Dermatology stating office scheduled pt to be seen in the end of November 2019. PT would like a new referral sent to Dermatology as an Urgent referral. Pt would also like to have Dr. Drue SecondSnider call regarding labs pt stated she needs copies of all labs done in our office to take to [place] in [place] who preformed some of her surgeries. PT did not mention who the MD was during the call.Pt would also like to know if she would be able to come to clinic to redo labs.   Will notify Dr. Drue SecondSnider that pt would like a new referral sent in as urgent per Dermatologies request and that she would also like lab result copies.  [name], [place]

## 2018-01-31 NOTE — Telephone Encounter (Signed)
Patient would like for Dr. Drue SecondSnider to call her concerning a referral to a dermatologist.  She is very upset with her visit here and that she needs to be referred right away because she is only getting worse.

## 2018-02-02 NOTE — Telephone Encounter (Signed)
Please let her know we don't have any pull on having her being seen by a dermatologist any sooner

## 2018-02-14 ENCOUNTER — Ambulatory Visit (HOSPITAL_COMMUNITY): Payer: Self-pay | Admitting: Psychiatry

## 2018-07-19 DIAGNOSIS — L0390 Cellulitis, unspecified: Secondary | ICD-10-CM

## 2018-07-19 HISTORY — DX: Cellulitis, unspecified: L03.90

## 2018-07-25 ENCOUNTER — Emergency Department (HOSPITAL_COMMUNITY): Payer: Medicare Other

## 2018-07-25 ENCOUNTER — Inpatient Hospital Stay (HOSPITAL_COMMUNITY)
Admission: EM | Admit: 2018-07-25 | Discharge: 2018-07-28 | DRG: 300 | Disposition: A | Discharge status: Home Health | Payer: Medicare Other | Attending: Internal Medicine | Admitting: Internal Medicine

## 2018-07-25 ENCOUNTER — Encounter (HOSPITAL_COMMUNITY): Payer: Self-pay | Admitting: Emergency Medicine

## 2018-07-25 DIAGNOSIS — Z79890 Hormone replacement therapy (postmenopausal): Secondary | ICD-10-CM

## 2018-07-25 DIAGNOSIS — F22 Delusional disorders: Secondary | ICD-10-CM | POA: Diagnosis present

## 2018-07-25 DIAGNOSIS — M797 Fibromyalgia: Secondary | ICD-10-CM | POA: Diagnosis present

## 2018-07-25 DIAGNOSIS — I2721 Secondary pulmonary arterial hypertension: Secondary | ICD-10-CM | POA: Diagnosis present

## 2018-07-25 DIAGNOSIS — F419 Anxiety disorder, unspecified: Secondary | ICD-10-CM | POA: Diagnosis present

## 2018-07-25 DIAGNOSIS — L03116 Cellulitis of left lower limb: Secondary | ICD-10-CM | POA: Diagnosis present

## 2018-07-25 DIAGNOSIS — G8929 Other chronic pain: Secondary | ICD-10-CM | POA: Diagnosis present

## 2018-07-25 DIAGNOSIS — Z880 Allergy status to penicillin: Secondary | ICD-10-CM

## 2018-07-25 DIAGNOSIS — F32A Depression, unspecified: Secondary | ICD-10-CM | POA: Diagnosis present

## 2018-07-25 DIAGNOSIS — R600 Localized edema: Secondary | ICD-10-CM | POA: Diagnosis present

## 2018-07-25 DIAGNOSIS — F988 Other specified behavioral and emotional disorders with onset usually occurring in childhood and adolescence: Secondary | ICD-10-CM | POA: Diagnosis present

## 2018-07-25 DIAGNOSIS — Z888 Allergy status to other drugs, medicaments and biological substances status: Secondary | ICD-10-CM

## 2018-07-25 DIAGNOSIS — Z882 Allergy status to sulfonamides status: Secondary | ICD-10-CM

## 2018-07-25 DIAGNOSIS — Z96643 Presence of artificial hip joint, bilateral: Secondary | ICD-10-CM | POA: Diagnosis present

## 2018-07-25 DIAGNOSIS — I82412 Acute embolism and thrombosis of left femoral vein: Secondary | ICD-10-CM | POA: Diagnosis not present

## 2018-07-25 DIAGNOSIS — Z885 Allergy status to narcotic agent status: Secondary | ICD-10-CM

## 2018-07-25 DIAGNOSIS — Z881 Allergy status to other antibiotic agents status: Secondary | ICD-10-CM

## 2018-07-25 DIAGNOSIS — E039 Hypothyroidism, unspecified: Secondary | ICD-10-CM | POA: Diagnosis present

## 2018-07-25 DIAGNOSIS — I824Z2 Acute embolism and thrombosis of unspecified deep veins of left distal lower extremity: Secondary | ICD-10-CM | POA: Diagnosis present

## 2018-07-25 DIAGNOSIS — F329 Major depressive disorder, single episode, unspecified: Secondary | ICD-10-CM | POA: Diagnosis present

## 2018-07-25 DIAGNOSIS — Z79899 Other long term (current) drug therapy: Secondary | ICD-10-CM

## 2018-07-25 DIAGNOSIS — M7989 Other specified soft tissue disorders: Secondary | ICD-10-CM | POA: Diagnosis not present

## 2018-07-25 DIAGNOSIS — Z981 Arthrodesis status: Secondary | ICD-10-CM

## 2018-07-25 HISTORY — DX: Cellulitis, unspecified: L03.90

## 2018-07-25 LAB — COMPREHENSIVE METABOLIC PANEL
ALT: 26 U/L (ref 0–44)
AST: 22 U/L (ref 15–41)
Albumin: 3.6 g/dL (ref 3.5–5.0)
Alkaline Phosphatase: 92 U/L (ref 38–126)
Anion gap: 8 (ref 5–15)
BUN: 25 mg/dL — ABNORMAL HIGH (ref 8–23)
CO2: 24 mmol/L (ref 22–32)
Calcium: 9.1 mg/dL (ref 8.9–10.3)
Chloride: 108 mmol/L (ref 98–111)
Creatinine, Ser: 1 mg/dL (ref 0.44–1.00)
GFR calc Af Amer: >60 mL/min (ref >60)
GFR calc non Af Amer: 57 mL/min — ABNORMAL LOW (ref >60)
Glucose, Bld: 99 mg/dL (ref 70–99)
Potassium: 3.6 mmol/L (ref 3.5–5.1)
Sodium: 140 mmol/L (ref 135–145)
Total Bilirubin: 0.5 mg/dL (ref 0.3–1.2)
Total Protein: 6.7 g/dL (ref 6.5–8.1)

## 2018-07-25 LAB — CBC WITH DIFFERENTIAL/PLATELET
Abs Immature Granulocytes: 0.04 10*3/uL (ref 0.00–0.07)
Basophils Absolute: 0.1 10*3/uL (ref 0.0–0.1)
Basophils Relative: 1 %
Eosinophils Absolute: 0.7 10*3/uL — ABNORMAL HIGH (ref 0.0–0.5)
Eosinophils Relative: 7 %
HCT: 38.3 % (ref 36.0–46.0)
Hemoglobin: 12.6 g/dL (ref 12.0–15.0)
Immature Granulocytes: 0 %
Lymphocytes Relative: 15 %
Lymphs Abs: 1.5 10*3/uL (ref 0.7–4.0)
MCH: 30.9 pg (ref 26.0–34.0)
MCHC: 32.9 g/dL (ref 30.0–36.0)
MCV: 93.9 fL (ref 80.0–100.0)
Monocytes Absolute: 0.8 10*3/uL (ref 0.1–1.0)
Monocytes Relative: 8 %
Neutro Abs: 6.8 10*3/uL (ref 1.7–7.7)
Neutrophils Relative %: 69 %
Platelets: 299 10*3/uL (ref 150–400)
RBC: 4.08 MIL/uL (ref 3.87–5.11)
RDW: 13 % (ref 11.5–15.5)
WBC: 9.8 10*3/uL (ref 4.0–10.5)
nRBC: 0 % (ref 0.0–0.2)

## 2018-07-25 LAB — D-DIMER, QUANTITATIVE: D-Dimer, Quant: 7.38 ug/mL-FEU — ABNORMAL HIGH (ref 0.00–0.50)

## 2018-07-25 MED ORDER — MORPHINE SULFATE (PF) 4 MG/ML IV SOLN
4.0000 mg | Freq: Once | INTRAVENOUS | Status: AC
  Administered 2018-07-25: 4 mg via INTRAVENOUS
  Filled 2018-07-25: qty 1

## 2018-07-25 MED ORDER — IOHEXOL 300 MG/ML  SOLN
100.0000 mL | Freq: Once | INTRAMUSCULAR | Status: AC | PRN
  Administered 2018-07-25: 100 mL via INTRAVENOUS

## 2018-07-25 MED ORDER — ONDANSETRON HCL 4 MG/2ML IJ SOLN
4.0000 mg | Freq: Once | INTRAMUSCULAR | Status: AC
  Administered 2018-07-25: 4 mg via INTRAVENOUS
  Filled 2018-07-25: qty 2

## 2018-07-25 NOTE — ED Provider Notes (Signed)
[facility] EMERGENCY [facility]PARTMENT Provider Note   CSN: [id number] Arrival date & time: 07/25/18  1832     History   Chief Complaint Chief Complaint  Patient presents with  . Leg Swelling    HPI [name] is a 68 y.o. female.  HPI Patient presents with concern of left leg swelling discoloration, and generalized discomfort. Patient has multiple medical issues including recurrent injuries to her hip, with most recent surgical intervention about 1 year ago, in [place]. She notes that she had accident, sustained complex fracture of her pelvis, hip, requiring repair. Not long after the procedure she began to have swelling, inconsistently, but no it has become substantially more uncomfortable, with dyspnea, fatigue, difficulty with walking, as well as with new discoloration.  Patient notes a fever 100-ish over the past few days. The patient notes that she has become notably worse over the past few weeks, she affirms that she has not spoken with her surgeon, no with any other physicians during this time period, stating that she was waiting to complete some important life events prior to seeking medical care. She is here with her son.  Past Medical History:  Diagnosis Date  . Anemia   . Chronic kidney disease    surgery to fix  . Chronic pain   . Fibromyalgia   . Lyme disease   . Thyroid disease     Patient Active Problem List   Diagnosis Date Noted  . Anxiety 08/12/2017  . Chronic low back pain without sciatica 08/12/2017  . Status post revision of total hip 08/12/2017  . Multiple allergies 01/26/2017  . 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 Stricture and stenosis of esophagus 11/30/2013  . Throat pain 11/30/2013  . Diverticulosis of colon without hemorrhage 10/29/2013  . 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 Allergic rhinitis 03/26/2011  . Attention deficit hyperactivity disorder (ADHD) 03/26/2011  . Acquired hypothyroidism  03/26/2011  . Depression 03/26/2011  . Fibromyalgia 03/26/2011  . History of deep venous thrombosis 03/26/2011  . History of migraine 03/26/2011  . Other mechanical complication of prosthetic joint implant 03/26/2011  . Gastroesophageal reflux disease 03/26/2011  . Zenker's diverticulum 03/26/2011    Past Surgical History:  Procedure Laterality Date  . APPENDECTOMY  1974  . Avascular necrosis of the hip     multiple operations starting in 2005.  bilateral hips  . Back fusion  1999  . CERVICAL FUSION  [id number]  . KIDNEY SURGERY  1971   kidney reconstruction  . LAMINECTOMY  1985  . TOTAL HIP ARTHROPLASTY       OB History   No obstetric history on file.      Home Medications    Prior to Admission medications   Medication Sig Start Date End Date Taking? Authorizing Provider  benzonatate (TESSALON) 100 MG capsule Take 1 capsule (100 mg total) by mouth every 8 (eight) hours. Patient taking differently: Take 100 mg by mouth every 8 (eight) hours as needed for cough.  04/21/14   Beverely Risenarr, Dennis, MD  BuPROPion HCl ER, XL, 450 MG TB24 Take 450 mg by mouth daily.    [provider]  busPIRone (BUSPAR) 5 MG tablet Take 1 tablet (5 mg total) by mouth 2 (two) times daily. 01/02/18 01/02/19  Arfeen, Phillips GroutSyed T, MD  Cholecalciferol (VITAMIN D PO) Take 1 capsule by mouth every other day.    [provider]  cyclobenzaprine (FLEXERIL) 5 MG tablet Take 5 mg by mouth 3 (three) times  daily.  10/16/13   [provider]  dexmethylphenidate (FOCALIN XR) 20 MG 24 hr capsule Take 20 mg by mouth 2 (two) times daily. 10/24/13   Charm Rings, MD  dexmethylphenidate (FOCALIN) 10 MG tablet Take 10 mg by mouth daily as needed (if second dose of 20mg  capsule isn't taken).  10/24/13   Charm Rings, MD  diclofenac (VOLTAREN) 50 MG EC tablet Take 100 mg by mouth every morning.     [provider]  escitalopram (LEXAPRO) 20 MG tablet Take 40 mg by mouth daily.    [provider]   ketoconazole (NIZORAL) 2 % shampoo Apply 1 application topically 2 (two) times a week. 10/10/17   Charlynne Pander, MD  methocarbamol (ROBAXIN) 500 MG tablet Take 500 mg by mouth every morning.     [provider]  NONFORMULARY OR COMPOUNDED ITEM Take 1 capsule by mouth daily. M.H.B    [provider]  OVER THE COUNTER MEDICATION Take 1 capsule by mouth daily. N.A.C    [provider]  saccharomyces boulardii (FLORASTOR) 250 MG capsule Take 250 mg by mouth daily.    [provider]  thyroid (NATURE-THROID) 65 MG tablet Take 65 mg by mouth daily.    [provider]  topiramate (TOPAMAX) 100 MG tablet Take 100 mg by mouth every morning.     [provider]  VITAMIN K PO Take 2 capsules by mouth daily.    [provider]  XANAX 0.25 MG tablet Take 0.25 mg by mouth at bedtime as needed for sleep.  10/22/13   [provider]    Family History Family History  Problem Relation Age of Onset  . Cancer Sister   . Colon cancer Neg Hx   . Stomach cancer Neg Hx   . Esophageal cancer Neg Hx     Social History Social History   Tobacco Use  . Smoking status: Never Smoker  . Smokeless tobacco: Never Used  Substance Use Topics  . Alcohol use: Yes    Comment: occasional  . Drug use: No     Allergies   Amoxicillin-pot clavulanate; Clarithromycin; Fentanyl; Levofloxacin; Lorazepam; Doxycycline; Penicillins; and Sulfa antibiotics   Review of Systems Review of Systems  Constitutional:       Per HPI, otherwise negative  HENT:       Per HPI, otherwise negative  Respiratory:       Per HPI, otherwise negative  Cardiovascular:       Per HPI, otherwise negative  Gastrointestinal: Negative for vomiting.  Endocrine:       Negative aside from HPI  Genitourinary:       Neg aside from HPI   Musculoskeletal:       Per HPI, otherwise negative  Skin: Positive for color change.  Neurological: Positive for weakness. Negative for  syncope.  Psychiatric/Behavioral: The patient is nervous/anxious.      Physical Exam Updated Vital Signs BP (!) 125/51   Pulse 77   Temp 99.1 F (37.3 C) (Oral)   Resp 16   SpO2 97%   Physical Exam Vitals signs and nursing note reviewed.  Constitutional:      General: She is not in acute distress.    Appearance: She is well-developed.  HENT:     Head: Normocephalic and atraumatic.  Eyes:     Conjunctiva/sclera: Conjunctivae normal.  Cardiovascular:     Rate and Rhythm: Normal rate and regular rhythm.     Pulses: Normal pulses.  Comments: Appropriate distal pulses L DP Pulmonary:     Effort: Pulmonary effort is normal. No respiratory distress.     Breath sounds: Normal breath sounds. No stridor.  Abdominal:     General: There is no distension.  Musculoskeletal:     Right hip: Normal.       Legs:  Skin:    General: Skin is warm and dry.  Neurological:     Mental Status: She is alert and oriented to person, place, and time.     Cranial Nerves: No cranial nerve deficit.  Psychiatric:        Mood and Affect: Mood is anxious.      ED Treatments / Results  Labs (all labs ordered are listed, but only abnormal results are displayed) Labs Reviewed  COMPREHENSIVE METABOLIC PANEL - Abnormal; Notable for the following components:      Result Value   BUN 25 (*)    GFR calc non Af Amer 57 (*)    All other components within normal limits  CBC WITH DIFFERENTIAL/PLATELET - Abnormal; Notable for the following components:   Eosinophils Absolute 0.7 (*)    All other components within normal limits  D-DIMER, QUANTITATIVE (NOT AT [facility]) - Abnormal; Notable for the following components:   D-Dimer, Quant 7.38 (*)    All other components within normal limits     Radiology Dg Hip Unilat With Pelvis 2-3 Views Left  Result Date: 07/25/2018 CLINICAL DATA:  68 y/o F; history of multiple hip surgeries. Left hip swelling from the waist down. Fall. EXAM: DG HIP (WITH OR WITHOUT  PELVIS) 2-3V LEFT COMPARISON:  08/04/2013 abdomen radiographs. 04/16/2011 left hip radiographs. FINDINGS: No acute fracture or dislocation identified. Right total hip prosthesis is partially visualized without periprosthetic lucency or fracture. The left hip prosthesis protrudes through the acetabulum with the acetabular component renal for spina plate fixed to the ileum by multiple screws. There is surrounding heterotopic ossification in high attenuation debris within the soft tissues. No acute fracture is identified. Lower lumbar fusion changes noted. IMPRESSION: 1. No acute fracture or dislocation identified. 2. Bilateral total hip arthroplasty. The left-sided prosthesis is protruded through the acetabulum and fixed to the ileum with a plate reinforcement. No prior study to assess hardware stability. Electronically Signed   By: Mitzi HansenLance  Furusawa-Stratton M.D.   On: 07/25/2018 22:33    Procedures Procedures (including critical care time)  Medications Ordered in ED Medications - No data to display   Initial Impression / Assessment and Plan / ED Course  I have reviewed the triage vital signs and the nursing notes.  Pertinent labs & imaging results that were available during my care of the patient were reviewed by me and considered in my medical decision making (see chart for details).     11:22 PM Initial findings notable for elevated d-dimer. Patient remains afebrile, no leukocytosis, lower suspicion for cellulitis, but with concern for occlusive disease, I discussed her case with our radiologist on call given the lack of availability for ultrasonography. Patient will have CT scan performed for evaluation of phlegmasia/DVT. On reexam patient is aware of this, continues to have some pain.  Patient receiving IV fluids, analgesia, pending CT scan.  Dr. Erma HeritageIsaacs is aware of the patient, and will follow up on her course.  Final Clinical Impressions(s) / ED Diagnoses  Leg swelling, left, initial  encounter Elevated d-dimer   Gerhard MunchLockwood, Grason Brailsford, MD 07/25/18 2324

## 2018-07-25 NOTE — ED Notes (Signed)
+   Pt stated that she had a syncopal episode (unwitnessed) followed by severe nausea when she came to. Pt stated "I felt horrible, numbed, and had to hold onto things to get to the next room."

## 2018-07-25 NOTE — ED Provider Notes (Signed)
Assumed care from Dr. Jeraldine Loots at 11:05 PM. Briefly, the patient is a 68 y.o. female with PMHx of  has a past medical history of Anemia, Chronic kidney disease, Chronic pain, Fibromyalgia, Lyme disease, and Thyroid disease. here with leg swelling, pain. H/o similar sx per review of records. On exam, pt with diffuse swelling, petechiae. Plt normal on CBC. CMP reassuring. D-Dimer markedly elevated. Suspect DVT vs venous stasis. Palpable Dp/PT pulses, sx not c/w arterial occlusion.  Labs Reviewed  COMPREHENSIVE METABOLIC PANEL - Abnormal; Notable for the following components:      Result Value   BUN 25 (*)    GFR calc non Af Amer 57 (*)    All other components within normal limits  CBC WITH DIFFERENTIAL/PLATELET - Abnormal; Notable for the following components:   Eosinophils Absolute 0.7 (*)    All other components within normal limits  D-DIMER, QUANTITATIVE (NOT AT [facility]) - Abnormal; Notable for the following components:   D-Dimer, Quant 7.38 (*)    All other components within normal limits       Course of Care: -Dr. Jeraldine Loots discussed with Radiology, will obtain CT Venogram. -Exam as above, pt with no CP, SOB or signs of PE. Analgesia ordered. -CT scan neg for large DVT, though limited 2/2 artifact. Scan does show likely cellulitis, no abscess. IV Clinda ordered 2/2 allergies, admit to medicine. Would consider DVT scan as inpt.    Shaune Pollack, MD 07/26/18 [phone]

## 2018-07-25 NOTE — ED Triage Notes (Signed)
Pt c/o swelling left leg that causes her pain along w/ hip pain.  Pt states this has been a problem since hip surgery last year.  Pt also states she is having problems moving her bowels.

## 2018-07-26 ENCOUNTER — Observation Stay (HOSPITAL_COMMUNITY): Payer: Medicare Other

## 2018-07-26 ENCOUNTER — Other Ambulatory Visit: Payer: Self-pay

## 2018-07-26 ENCOUNTER — Encounter (HOSPITAL_COMMUNITY): Payer: Self-pay | Admitting: Internal Medicine

## 2018-07-26 DIAGNOSIS — Z79899 Other long term (current) drug therapy: Secondary | ICD-10-CM | POA: Diagnosis not present

## 2018-07-26 DIAGNOSIS — E039 Hypothyroidism, unspecified: Secondary | ICD-10-CM

## 2018-07-26 DIAGNOSIS — M7989 Other specified soft tissue disorders: Secondary | ICD-10-CM | POA: Diagnosis present

## 2018-07-26 DIAGNOSIS — R609 Edema, unspecified: Secondary | ICD-10-CM

## 2018-07-26 DIAGNOSIS — F419 Anxiety disorder, unspecified: Secondary | ICD-10-CM | POA: Diagnosis present

## 2018-07-26 DIAGNOSIS — Z881 Allergy status to other antibiotic agents status: Secondary | ICD-10-CM | POA: Diagnosis not present

## 2018-07-26 DIAGNOSIS — I82402 Acute embolism and thrombosis of unspecified deep veins of left lower extremity: Secondary | ICD-10-CM | POA: Diagnosis not present

## 2018-07-26 DIAGNOSIS — Z885 Allergy status to narcotic agent status: Secondary | ICD-10-CM | POA: Diagnosis not present

## 2018-07-26 DIAGNOSIS — F329 Major depressive disorder, single episode, unspecified: Secondary | ICD-10-CM | POA: Diagnosis present

## 2018-07-26 DIAGNOSIS — F988 Other specified behavioral and emotional disorders with onset usually occurring in childhood and adolescence: Secondary | ICD-10-CM | POA: Diagnosis present

## 2018-07-26 DIAGNOSIS — R600 Localized edema: Secondary | ICD-10-CM | POA: Diagnosis present

## 2018-07-26 DIAGNOSIS — M797 Fibromyalgia: Secondary | ICD-10-CM | POA: Diagnosis present

## 2018-07-26 DIAGNOSIS — I824Z2 Acute embolism and thrombosis of unspecified deep veins of left distal lower extremity: Secondary | ICD-10-CM | POA: Diagnosis not present

## 2018-07-26 DIAGNOSIS — Z880 Allergy status to penicillin: Secondary | ICD-10-CM | POA: Diagnosis not present

## 2018-07-26 DIAGNOSIS — Z96643 Presence of artificial hip joint, bilateral: Secondary | ICD-10-CM | POA: Diagnosis present

## 2018-07-26 DIAGNOSIS — G8929 Other chronic pain: Secondary | ICD-10-CM | POA: Diagnosis present

## 2018-07-26 DIAGNOSIS — I82412 Acute embolism and thrombosis of left femoral vein: Secondary | ICD-10-CM | POA: Diagnosis present

## 2018-07-26 DIAGNOSIS — F22 Delusional disorders: Secondary | ICD-10-CM | POA: Diagnosis present

## 2018-07-26 DIAGNOSIS — I2721 Secondary pulmonary arterial hypertension: Secondary | ICD-10-CM | POA: Diagnosis present

## 2018-07-26 DIAGNOSIS — L03116 Cellulitis of left lower limb: Secondary | ICD-10-CM

## 2018-07-26 DIAGNOSIS — Z882 Allergy status to sulfonamides status: Secondary | ICD-10-CM | POA: Diagnosis not present

## 2018-07-26 DIAGNOSIS — Z79890 Hormone replacement therapy (postmenopausal): Secondary | ICD-10-CM | POA: Diagnosis not present

## 2018-07-26 DIAGNOSIS — Z981 Arthrodesis status: Secondary | ICD-10-CM | POA: Diagnosis not present

## 2018-07-26 DIAGNOSIS — Z888 Allergy status to other drugs, medicaments and biological substances status: Secondary | ICD-10-CM | POA: Diagnosis not present

## 2018-07-26 LAB — BASIC METABOLIC PANEL
Anion gap: 9 (ref 5–15)
BUN: 24 mg/dL — ABNORMAL HIGH (ref 8–23)
CO2: 21 mmol/L — ABNORMAL LOW (ref 22–32)
Calcium: 8.5 mg/dL — ABNORMAL LOW (ref 8.9–10.3)
Chloride: 109 mmol/L (ref 98–111)
Creatinine, Ser: 0.88 mg/dL (ref 0.44–1.00)
GFR calc Af Amer: >60 mL/min (ref >60)
GFR calc non Af Amer: >60 mL/min (ref >60)
Glucose, Bld: 117 mg/dL — ABNORMAL HIGH (ref 70–99)
Potassium: 3.6 mmol/L (ref 3.5–5.1)
Sodium: 139 mmol/L (ref 135–145)

## 2018-07-26 LAB — CBC
HCT: 36.9 % (ref 36.0–46.0)
Hemoglobin: 11.8 g/dL — ABNORMAL LOW (ref 12.0–15.0)
MCH: 29.9 pg (ref 26.0–34.0)
MCHC: 32 g/dL (ref 30.0–36.0)
MCV: 93.4 fL (ref 80.0–100.0)
Platelets: 250 10*3/uL (ref 150–400)
RBC: 3.95 MIL/uL (ref 3.87–5.11)
RDW: 13 % (ref 11.5–15.5)
WBC: 8.6 10*3/uL (ref 4.0–10.5)
nRBC: 0 % (ref 0.0–0.2)

## 2018-07-26 LAB — HIV ANTIBODY (ROUTINE TESTING W REFLEX): HIV Screen 4th Generation wRfx: NONREACTIVE

## 2018-07-26 MED ORDER — CLINDAMYCIN PHOSPHATE 900 MG/50ML IV SOLN
900.0000 mg | Freq: Three times a day (TID) | INTRAVENOUS | Status: DC
  Administered 2018-07-26 – 2018-07-27 (×6): 900 mg via INTRAVENOUS
  Filled 2018-07-26 (×9): qty 50

## 2018-07-26 MED ORDER — HYDROCODONE-ACETAMINOPHEN 5-325 MG PO TABS
1.0000 | ORAL_TABLET | Freq: Four times a day (QID) | ORAL | Status: DC | PRN
  Administered 2018-07-26: 1 via ORAL
  Filled 2018-07-26: qty 1

## 2018-07-26 MED ORDER — IOPAMIDOL (ISOVUE-370) INJECTION 76%
INTRAVENOUS | Status: AC
  Administered 2018-07-26: 100 mL
  Filled 2018-07-26: qty 100

## 2018-07-26 MED ORDER — ENOXAPARIN SODIUM 60 MG/0.6ML ~~LOC~~ SOLN
60.0000 mg | Freq: Two times a day (BID) | SUBCUTANEOUS | Status: DC
  Administered 2018-07-26 – 2018-07-27 (×2): 60 mg via SUBCUTANEOUS
  Filled 2018-07-26 (×2): qty 0.6

## 2018-07-26 MED ORDER — ALPRAZOLAM 0.25 MG PO TABS
0.2500 mg | ORAL_TABLET | Freq: Every evening | ORAL | Status: DC | PRN
  Administered 2018-07-26 – 2018-07-27 (×2): 0.25 mg via ORAL
  Filled 2018-07-26 (×2): qty 1

## 2018-07-26 MED ORDER — METHOCARBAMOL 500 MG PO TABS
500.0000 mg | ORAL_TABLET | Freq: Every morning | ORAL | Status: DC
  Administered 2018-07-26 – 2018-07-28 (×3): 500 mg via ORAL
  Filled 2018-07-26 (×3): qty 1

## 2018-07-26 MED ORDER — ACETAMINOPHEN 325 MG PO TABS
650.0000 mg | ORAL_TABLET | Freq: Four times a day (QID) | ORAL | Status: DC | PRN
  Filled 2018-07-26: qty 2

## 2018-07-26 MED ORDER — TOPIRAMATE 100 MG PO TABS
100.0000 mg | ORAL_TABLET | Freq: Every day | ORAL | Status: DC
  Filled 2018-07-26: qty 1

## 2018-07-26 MED ORDER — ONDANSETRON HCL 4 MG PO TABS: 4.0000 mg | ORAL_TABLET | Freq: Four times a day (QID) | ORAL | Status: DC | PRN

## 2018-07-26 MED ORDER — CLINDAMYCIN PHOSPHATE 900 MG/50ML IV SOLN
900.0000 mg | Freq: Once | INTRAVENOUS | Status: AC
  Administered 2018-07-26: 900 mg via INTRAVENOUS
  Filled 2018-07-26: qty 50

## 2018-07-26 MED ORDER — BUPROPION HCL ER (XL) 150 MG PO TB24
450.0000 mg | ORAL_TABLET | Freq: Every day | ORAL | Status: DC
  Administered 2018-07-26 – 2018-07-28 (×3): 450 mg via ORAL
  Filled 2018-07-26 (×4): qty 3

## 2018-07-26 MED ORDER — ONDANSETRON HCL 4 MG/2ML IJ SOLN
4.0000 mg | Freq: Four times a day (QID) | INTRAMUSCULAR | Status: DC | PRN
  Administered 2018-07-26 – 2018-07-27 (×2): 4 mg via INTRAVENOUS
  Filled 2018-07-26 (×2): qty 2

## 2018-07-26 MED ORDER — THYROID 60 MG PO TABS
60.0000 mg | ORAL_TABLET | Freq: Every day | ORAL | Status: DC
  Administered 2018-07-26 – 2018-07-28 (×3): 60 mg via ORAL
  Filled 2018-07-26 (×6): qty 1

## 2018-07-26 MED ORDER — ACETAMINOPHEN 650 MG RE SUPP: 650.0000 mg | Freq: Four times a day (QID) | RECTAL | Status: DC | PRN

## 2018-07-26 MED ORDER — SODIUM CHLORIDE 0.9 % IV SOLN
INTRAVENOUS | Status: DC | PRN
  Administered 2018-07-26: 500 mL via INTRAVENOUS
  Administered 2018-07-27: 250 mL via INTRAVENOUS

## 2018-07-26 MED ORDER — THYROID 60 MG PO TABS
65.0000 mg | ORAL_TABLET | Freq: Every day | ORAL | Status: DC
  Filled 2018-07-26: qty 1

## 2018-07-26 MED ORDER — ESCITALOPRAM OXALATE 20 MG PO TABS
40.0000 mg | ORAL_TABLET | Freq: Every day | ORAL | Status: DC
  Administered 2018-07-26 – 2018-07-28 (×3): 40 mg via ORAL
  Filled 2018-07-26: qty 2
  Filled 2018-07-26 (×2): qty 4

## 2018-07-26 MED ORDER — ENOXAPARIN SODIUM 40 MG/0.4ML ~~LOC~~ SOLN: 40.0000 mg | Freq: Every day | SUBCUTANEOUS | Status: DC

## 2018-07-26 MED ORDER — DEXMETHYLPHENIDATE HCL ER 5 MG PO CP24
20.0000 mg | ORAL_CAPSULE | Freq: Two times a day (BID) | ORAL | Status: DC
  Administered 2018-07-26 – 2018-07-28 (×4): 20 mg via ORAL
  Filled 2018-07-26 (×7): qty 4

## 2018-07-26 MED ORDER — MORPHINE SULFATE (PF) 2 MG/ML IV SOLN
1.0000 mg | INTRAVENOUS | Status: DC | PRN
  Administered 2018-07-26 – 2018-07-27 (×5): 1 mg via INTRAVENOUS
  Filled 2018-07-26 (×5): qty 1

## 2018-07-26 MED ORDER — TOPIRAMATE 100 MG PO TABS
100.0000 mg | ORAL_TABLET | Freq: Two times a day (BID) | ORAL | Status: DC
  Administered 2018-07-26 – 2018-07-28 (×5): 100 mg via ORAL
  Filled 2018-07-26 (×6): qty 1

## 2018-07-26 MED ORDER — CYCLOBENZAPRINE HCL 5 MG PO TABS
5.0000 mg | ORAL_TABLET | Freq: Every day | ORAL | Status: DC
  Administered 2018-07-26 – 2018-07-28 (×3): 5 mg via ORAL
  Filled 2018-07-26 (×3): qty 1

## 2018-07-26 NOTE — Progress Notes (Signed)
Patient arrived from ED, respirations even and unlabored skin warm and dry. Patient friendly and cooperative. Patient denies having valuables here except cell phone with cracked front and a cane.

## 2018-07-26 NOTE — Progress Notes (Signed)
LLE venous duplex       has been completed. Preliminary results can be found under CV proc through chart review. Jeb Levering, BS, RDMS, RVT   Text paged Dr. Katrinka Blazing with results

## 2018-07-26 NOTE — H&P (Signed)
History and Physical    [name] [id number] DOB: 12/28/1950 DOA: 07/25/2018  PCP: Lenice LlamasBrown, Leslie A, NP  Patient coming from: Home.  Chief Complaint: Left leg swelling and erythema.  HPI: [name] is a 68 y.o. female with history of hypothyroidism, ADD, depression, chronic pain, migraine presents to the ER after patient has been having increasing pain and swelling progressing over the last 3 weeks.  Patient has been having increasing erythema of the left lower extremity with subjective feeling of fever and chills.  Denies any trauma.  ED Course: In the ER on exam patient has significant swelling of the left lower extremity with erythema.  D-dimer was elevated and patient had a CT venogram which was suboptimal to study for DVT but did show signs concerning for cellulitis.  Blood cultures were obtained and started on clindamycin.  Dopplers were ordered to rule out DVT.  Review of Systems: As per HPI, rest all negative.   Past Medical History:  Diagnosis Date  . Anemia   . Chronic kidney disease    surgery to fix  . Chronic pain   . Fibromyalgia   . Lyme disease   . Thyroid disease     Past Surgical History:  Procedure Laterality Date  . APPENDECTOMY  1974  . Avascular necrosis of the hip     multiple operations starting in 2005.  bilateral hips  . Back fusion  1999  . CERVICAL FUSION  [id number],1997  . KIDNEY SURGERY  1971   kidney reconstruction  . LAMINECTOMY  1985  . TOTAL HIP ARTHROPLASTY       reports that she has never smoked. She has never used smokeless tobacco. She reports current alcohol use. She reports that she does not use drugs.  Allergies  Allergen Reactions  . Amoxicillin-Pot Clavulanate Nausea And Vomiting and Other (See Comments)  . Clarithromycin Other (See Comments)    DOES NOT REMEMBER REACTION DOES NOT REMEMBER REACTION   . Fentanyl Nausea And Vomiting and Other (See Comments)    FENTANYL PATCH FENTANYL PATCH FENTANYL PATCH   .  Levofloxacin Swelling    Face,eye swelling Face,eye swelling   . Lorazepam Other (See Comments)    DO NOT PUT ANY ATIVAN ON PATIENT'S IV POST SURGERY! DO NOT PUT ANY ATIVAN ON PATIENT'S IV POST SURGERY!   . Doxycycline Nausea And Vomiting  . Penicillins Hives  . Sulfa Antibiotics     Per pt: unknown    Family History  Problem Relation Age of Onset  . Cancer Sister   . Colon cancer Neg Hx   . Stomach cancer Neg Hx   . Esophageal cancer Neg Hx     Prior to Admission medications   Medication Sig Start Date End Date Taking? Authorizing Provider  benzonatate (TESSALON) 100 MG capsule Take 1 capsule (100 mg total) by mouth every 8 (eight) hours. Patient taking differently: Take 100 mg by mouth every 8 (eight) hours as needed for cough.  04/21/14  Yes Beverely Risenarr, Dennis, MD  BuPROPion HCl ER, XL, 450 MG TB24 Take 450 mg by mouth daily.   Yes [provider]  Cholecalciferol (VITAMIN D PO) Take 1 capsule by mouth every other day.   Yes [provider]  cyclobenzaprine (FLEXERIL) 5 MG tablet Take 5 mg by mouth See admin instructions. Take 1 tablet daily and as needed for muscle spasms 10/16/13  Yes [provider]  dexmethylphenidate (FOCALIN XR) 20 MG 24 hr capsule Take 20 mg by mouth 2 (  two) times daily. 10/24/13  Yes Charm Rings, MD  dexmethylphenidate (FOCALIN) 10 MG tablet Take 10 mg by mouth daily as needed (if second dose of 20mg  capsule isn't taken).  10/24/13  Yes Charm Rings, MD  diclofenac (VOLTAREN) 50 MG EC tablet Take 100 mg by mouth every morning.    Yes [provider]  escitalopram (LEXAPRO) 20 MG tablet Take 40 mg by mouth daily.   Yes [provider]  methocarbamol (ROBAXIN) 500 MG tablet Take 500 mg by mouth every morning.    Yes [provider]  thyroid (NATURE-THROID) 65 MG tablet Take 65 mg by mouth daily.   Yes [provider]  topiramate (TOPAMAX) 100 MG tablet Take 100 mg by mouth every morning.    Yes  [provider]  XANAX 0.25 MG tablet Take 0.25 mg by mouth at bedtime as needed for sleep.  10/22/13  Yes [provider]  busPIRone (BUSPAR) 5 MG tablet Take 1 tablet (5 mg total) by mouth 2 (two) times daily. Patient not taking: Reported on 07/26/2018 01/02/18 01/02/19  Arfeen, Phillips Grout, MD  ketoconazole (NIZORAL) 2 % shampoo Apply 1 application topically 2 (two) times a week. Patient not taking: Reported on 07/26/2018 10/10/17   Charlynne Pander, MD    Physical Exam: Vitals:   07/25/18 2122 07/25/18 2200 07/25/18 2230 07/25/18 2245  BP: (!) 146/82 132/72 121/71 (!) 125/51  Pulse: 81 79 78 77  Resp: 16     Temp:      TempSrc:      SpO2: 100% 98% 98% 97%      Constitutional: Moderately built and nourished. Vitals:   07/25/18 2122 07/25/18 2200 07/25/18 2230 07/25/18 2245  BP: (!) 146/82 132/72 121/71 (!) 125/51  Pulse: 81 79 78 77  Resp: 16     Temp:      TempSrc:      SpO2: 100% 98% 98% 97%   Eyes: Anicteric no pallor. ENMT: No discharge from the ears eyes nose or mouth. Neck: No mass felt.  No neck rigidity. Respiratory: No rhonchi or crepitations. Cardiovascular: S1-S2 heard. Abdomen: Soft nontender bowel sounds present. Musculoskeletal: Swelling of the left lower extremity extending up to the thigh.  With erythema. Skin: Erythema of the left lower extremity. Neurologic: Alert awake oriented to time place and person.  Moves all extremities. Psychiatric: Appears normal per normal affect.   Labs on Admission: I have personally reviewed following labs and imaging studies  CBC: Recent Labs  Lab 07/25/18 1944  WBC 9.8  NEUTROABS 6.8  HGB 12.6  HCT 38.3  MCV 93.9  PLT 299   Basic Metabolic Panel: Recent Labs  Lab 07/25/18 1944  NA 140  K 3.6  CL 108  CO2 24  GLUCOSE 99  BUN 25*  CREATININE 1.00  CALCIUM 9.1   GFR: CrCl cannot be calculated (Unknown ideal weight.). Liver Function Tests: Recent Labs  Lab 07/25/18 1944  AST 22  ALT 26    ALKPHOS 92  BILITOT 0.5  PROT 6.7  ALBUMIN 3.6   No results for input(s): LIPASE, AMYLASE in the last 168 hours. No results for input(s): AMMONIA in the last 168 hours. Coagulation Profile: No results for input(s): INR, PROTIME in the last 168 hours. Cardiac Enzymes: No results for input(s): CKTOTAL, CKMB, CKMBINDEX, TROPONINI in the last 168 hours. BNP (last 3 results) No results for input(s): PROBNP in the last 8760 hours. HbA1C: No results for input(s): HGBA1C in the last  72 hours. CBG: No results for input(s): GLUCAP in the last 168 hours. Lipid Profile: No results for input(s): CHOL, HDL, LDLCALC, TRIG, CHOLHDL, LDLDIRECT in the last 72 hours. Thyroid Function Tests: No results for input(s): TSH, T4TOTAL, FREET4, T3FREE, THYROIDAB in the last 72 hours. Anemia Panel: No results for input(s): VITAMINB12, FOLATE, FERRITIN, TIBC, IRON, RETICCTPCT in the last 72 hours. Urine analysis:    Component Value Date/Time   COLORURINE YELLOW 07/29/2015 0456   APPEARANCEUR CLEAR 07/29/2015 0456   LABSPEC 1.006 07/29/2015 0456   PHURINE 6.0 07/29/2015 0456   GLUCOSEU NEGATIVE 07/29/2015 0456   HGBUR NEGATIVE 07/29/2015 0456   BILIRUBINUR NEGATIVE 07/29/2015 0456   KETONESUR NEGATIVE 07/29/2015 0456   PROTEINUR NEGATIVE 07/29/2015 0456   UROBILINOGEN 0.2 08/04/2013 1750   NITRITE NEGATIVE 07/29/2015 0456   LEUKOCYTESUR SMALL (A) 07/29/2015 0456   Sepsis Labs: @LABRCNTIP (procalcitonin:4,lacticidven:4) )No results found for this or any previous visit (from the past 240 hour(s)).   Radiological Exams on Admission: Ct Extremity Lower Left W Contrast  Result Date: 07/26/2018 CLINICAL DATA:  New left lower extremity swelling and erythema. Concern for DVT. EXAM: CT OF THE LOWER LEFT EXTREMITY WITH CONTRAST TECHNIQUE: Multidetector CT imaging of the lower left extremity was performed according to the standard protocol following intravenous contrast administration. COMPARISON:  Same day  radiographs of the pelvis and left hip. CONTRAST:  100mL OMNIPAQUE IOHEXOL 300 MG/ML  SOLN FINDINGS: Bones/Joint/Cartilage Extensive metallic streak artifacts limit assessment about the left hip. 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 No new fracture is identified. Interbody cages are identified of the lower lumbar spine from L4 through S1 without complicating features. Mild joint space narrowing of the knee and included ankle. No acute fracture or bone destruction. No joint dislocation. No joint effusion. Ligaments Suboptimally assessed by CT. Muscles and Tendons No intramuscular hemorrhage or mass. Myositis is difficult to assess on CT. No enhancing fluid collection or intramuscular mass. Mild fatty atrophy of the hamstring muscles and induration of adjacent intramuscular fat possibly representing areas of mild fat necrosis. Soft tissues Diffuse subcutaneous soft tissue edema and skin thickening the included thigh and leg more so laterally. IMPRESSION: 1. Extensive metallic streak artifacts limit assessment about the left hip. 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 2. No acute fracture or bone destruction. 3. Diffuse subcutaneous soft tissue edema and skin thickening of the included thigh and leg more so laterally consistent with cellulitis. 4. Suboptimal assessment of deep venous system the left leg for thrombosis. Electronically Signed   By: Tollie Ethavid  Kwon M.D.   On: 07/26/2018 00:38   Dg Hip Unilat With Pelvis 2-3 Views Left  Result Date: 07/25/2018 CLINICAL DATA:  68 y/o F; history of multiple hip surgeries. Left hip swelling from the waist down. Fall. EXAM: DG HIP (WITH OR WITHOUT PELVIS) 2-3V LEFT COMPARISON:   08/04/2013 abdomen radiographs. 04/16/2011 left hip radiographs. FINDINGS: No acute fracture or dislocation identified. Right total hip prosthesis is partially visualized without periprosthetic lucency or fracture. The left hip prosthesis protrudes through the acetabulum with the acetabular component renal for spina plate fixed to the ileum by multiple screws. There is surrounding heterotopic ossification in high attenuation debris within the soft tissues. No acute fracture is identified. Lower lumbar fusion  changes noted. IMPRESSION: 1. No acute fracture or dislocation identified. 2. Bilateral total hip arthroplasty. The left-sided prosthesis is protruded through the acetabulum and fixed to the ileum with a plate reinforcement. No prior study to assess hardware stability. Electronically Signed   By: Mitzi Hansen M.D.   On: 07/25/2018 22:33    EKG: Independently reviewed.  Normal sinus rhythm.  Assessment/Plan Principal Problem:   Cellulitis of left leg Active Problems:   Ekbom's delusional parasitosis (HCC)   Anxiety   Acquired hypothyroidism   Depression   Leg edema, left   Cellulitis    1. Cellulitis of the left lower extremity -patient has been placed on clindamycin which will be continued.  Since there is concern for DVT and CT venogram study was suboptimal Dopplers have been ordered. 2. History of hypothyroidism on thyroid replacement. 3. History of ADD on Focalin. 4. Depression on Lexapro Wellbutrin and also takes Xanax as needed for anxiety. 5. History of migraine on Topamax. 6. Chronic pain on Robaxin. 7. History of delusional parasitosis.   DVT prophylaxis: Lovenox. Code Status: Full code. Family Communication: Discussed with patient. Disposition Plan: Home. Consults called: None. Admission status: Observation.   Eduard Clos MD [facility] Pager [phone].  If 7PM-7AM, please contact night-coverage [email or web address] Password  [facility]  07/26/2018, 3:37 AM

## 2018-07-26 NOTE — Progress Notes (Signed)
Received dexmethylphenidate 5mg  x4, upon scanning found not to be the XR. All 3 completely open pills discarded. 1 partially open pill also discarded. CRX machine not in this unit.

## 2018-07-26 NOTE — Progress Notes (Signed)
ANTICOAGULATION CONSULT NOTE - Initial Consult  Pharmacy Consult for Enoxaparin Indication: VTE  Allergies  Allergen Reactions  . Amoxicillin-Pot Clavulanate Nausea And Vomiting and Other (See Comments)  . Clarithromycin Other (See Comments)    DOES NOT REMEMBER REACTION DOES NOT REMEMBER REACTION   . Fentanyl Nausea And Vomiting and Other (See Comments)    FENTANYL PATCH FENTANYL PATCH FENTANYL PATCH   . Levofloxacin Swelling    Face,eye swelling Face,eye swelling   . Lorazepam Other (See Comments)    DO NOT PUT ANY ATIVAN ON PATIENT'S IV POST SURGERY! DO NOT PUT ANY ATIVAN ON PATIENT'S IV POST SURGERY!   . Doxycycline Nausea And Vomiting  . Penicillins Hives  . Sulfa Antibiotics     Per pt: unknown    Patient Measurements: Height: 5\' 1"  (154.9 cm) Weight: 142 lb 12.8 oz (64.8 kg) IBW/kg (Calculated) : 47.8   Vital Signs: Temp: 98.4 F (36.9 C) (01/08 1204) Temp Source: Axillary (01/08 1204) BP: 106/54 (01/08 1204) Pulse Rate: 70 (01/08 1204)  Labs: Recent Labs    07/25/18 1944 07/26/18 0524  HGB 12.6 11.8*  HCT 38.3 36.9  PLT 299 250  CREATININE 1.00 0.88    Estimated Creatinine Clearance: 52 mL/min (by C-G formula based on SCr of 0.88 mg/dL).   Medical History: Past Medical History:  Diagnosis Date  . Anemia   . Chronic kidney disease    surgery to fix  . Chronic pain   . Fibromyalgia   . Lyme disease   . Thyroid disease    Assessment: 68 yo female with new femoral DVT. Pharmacy consulted for enoxaparin. Patient without c/o bleeding. Patient instructed on enoxaparin.   Goal of Therapy:  Monitor platelets by anticoagulation protocol: Yes   Plan:  Enoxaparin 60mg  SQ q12h Monitor for bleeding.   Makinze Jani A. Jeanella Craze, PharmD, BCPS Clinical Pharmacist [place] Pager: [phone] Please utilize Amion for appropriate phone number to reach the unit pharmacist [facility])   07/26/2018,12:35 PM

## 2018-07-27 DIAGNOSIS — I82402 Acute embolism and thrombosis of unspecified deep veins of left lower extremity: Secondary | ICD-10-CM

## 2018-07-27 MED ORDER — RIVAROXABAN 15 MG PO TABS
15.0000 mg | ORAL_TABLET | Freq: Two times a day (BID) | ORAL | Status: DC
  Administered 2018-07-27 – 2018-07-28 (×3): 15 mg via ORAL
  Filled 2018-07-27 (×3): qty 1

## 2018-07-27 MED ORDER — HYDROCODONE-ACETAMINOPHEN 5-325 MG PO TABS
2.0000 | ORAL_TABLET | ORAL | Status: DC | PRN
  Administered 2018-07-27 (×2): 2 via ORAL
  Filled 2018-07-27 (×2): qty 2

## 2018-07-27 NOTE — Care Management (Signed)
Xarelto benefits check sent and pending.   Rawn Quiroa RN, BSN, NCM-BC, ACM-RN [phone] 

## 2018-07-27 NOTE — Progress Notes (Addendum)
Patient admitted after midnight, please see H&P.  Continue treatment with clindamycin for cellulitis. Patient is very anxious.  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).Venous Doppler ultrasound of the left lower extremity positive for DVT.  Started patient on Lovenox and will check ova and parasite if patient has stool.  Transition to oral a anticoagulant in a.m.  CT angiogram of the chest was ordered due to chest pain complaints questioning PE.  No PE found, but no mild pulmonary enlargement suggesting pulmonary hypertension.

## 2018-07-27 NOTE — Progress Notes (Signed)
ANTICOAGULATION CONSULT NOTE - Initial Consult  Pharmacy Consult for Xarelto Indication: VTE  Allergies  Allergen Reactions  . Amoxicillin-Pot Clavulanate Nausea And Vomiting and Other (See Comments)  . Clarithromycin Other (See Comments)    DOES NOT REMEMBER REACTION DOES NOT REMEMBER REACTION   . Fentanyl Nausea And Vomiting and Other (See Comments)    FENTANYL PATCH FENTANYL PATCH FENTANYL PATCH   . Levofloxacin Swelling    Face,eye swelling Face,eye swelling   . Lorazepam Other (See Comments)    DO NOT PUT ANY ATIVAN ON PATIENT'S IV POST SURGERY! DO NOT PUT ANY ATIVAN ON PATIENT'S IV POST SURGERY!   . Doxycycline Nausea And Vomiting  . Penicillins Hives  . Sulfa Antibiotics     Per pt: unknown    Patient Measurements: Height: 5\' 1"  (154.9 cm) Weight: 142 lb 12.8 oz (64.8 kg) IBW/kg (Calculated) : 47.8   Vital Signs: Temp: 98.7 F (37.1 C) (01/09 0521) Temp Source: Oral (01/09 0521) BP: 106/58 (01/09 0521) Pulse Rate: 67 (01/09 0521)  Labs: Recent Labs    07/25/18 1944 07/26/18 0524  HGB 12.6 11.8*  HCT 38.3 36.9  PLT 299 250  CREATININE 1.00 0.88    Estimated Creatinine Clearance: 52 mL/min (by C-G formula based on SCr of 0.88 mg/dL).   Medical History: Past Medical History:  Diagnosis Date  . Anemia   . Cellulitis 07/2018   left lower extremity  . Chronic kidney disease    surgery to fix  . Chronic pain   . Fibromyalgia   . Lyme disease   . Thyroid disease    Assessment: 68 yo female with new femoral DVT. Pharmacy initially consulted for enoxaparin, but now will be changed to rivaroxaban per MD. Patient without c/o bleeding. .   Goal of Therapy:  Monitor platelets by anticoagulation protocol: Yes   Plan:  Enoxaparin D/C'd Rivaroxaban 15mg  BID x 21 days, then 20mg  Daily thereafter. Monitor for bleeding.   [place] W[place] A. Jeanella Craze, PharmD, BCPS Clinical Pharmacist  Pager: [phone] Please utilize Amion for appropriate phone  number to reach the unit pharmacist [facility])   07/27/2018,9:28 AM

## 2018-07-27 NOTE — Progress Notes (Signed)
PROGRESS NOTE                                                                                                                                                                                                             Patient Demographics:    [name], is a 68 y.o. female, DOB - November 13, 1950, WUJ:[id number]  Admit date - 07/25/2018   Admitting Physician Eduard Clos, MD  Outpatient Primary MD for the patient is Lenice Llamas, NP  LOS - 1  Outpatient Specialists: psychiatry at [facility]  Chief Complaint  Patient presents with  . Leg Swelling       Brief Narrative    68 year old female with hypothyroidism ADD, depression, chronic pain, migraine, cervical degenerative disease, with strong delusion of parasitic infection of her lungs presented to the ED with increasing pain and swelling of the left lower extremity for past 3 weeks.  She also noticed increasing erythema of the left lower leg associated with subjective fever and chills.  Denies any trauma, recent illness or surgery. In the ED d-dimer was elevated, CT venogram was suboptimal for DVT but showed signs concerning for lower extremity cellulitis.  Patient admitted and further work-up with Doppler lower extremity showed acute DVT involving left common femoral vein, left femoral, left proximal profunda, left popliteal, left posterior tibial, left peroneal and left gastrocnemius vein, with thrombosis extending up to distal external iliac vein.    Subjective:   Complains of pain and swelling of the left lower leg.  Wants to see an ID physician regarding her parasitic infection.   Assessment  & Plan :    Principal Problem: Acute left lower extremity DVT Extensive DVT involving the left lower extremity extending up to distal external iliac vein.  Was placed on Lovenox, transition to Xarelto.  No clear cause for her symptoms.  Will treat for 3-6 months. CT  angiogram of the chest negative for PE with mild pulmonary artery hypertension.  Active Problems: Left lower leg cellulitis Appears to be improving clinically.  Continue clindamycin for now.    Ekbom's delusional parasitosis (HCC) Patient was seen at [facility].  Was seen by ENT few months back where she had similar concern.  Reportedly several years back she was seen by ID (Dr. Maurice March) who had clearly informed her she does not have any parasitic infection.  Acquired hypothyroidism Continue Synthroid.  Anxiety and depression Resume home medications  Chronic pain Resume home medications  Lower extremity pain and swelling.   Seen by PT and recommends outpatient therapy.  Patient does not have a PCP at present and consulted case management to assist [facility].  Code Status : Full code  Family Communication  : None at bedside  Disposition Plan  : Home possibly tomorrow if left leg pain and swelling and cellulitis improved.  Barriers For Discharge : Active symptoms  Consults  : None  Procedures  : CT of the left lower extremity, CT angiogram of the chest, Doppler lower extremity  DVT Prophylaxis  : Xarelto  Lab Results  Component Value Date   PLT 250 07/26/2018    Antibiotics  :    Anti-infectives (From admission, onward)   Start     Dose/Rate Route Frequency Ordered Stop   07/26/18 0800  clindamycin (CLEOCIN) IVPB 900 mg     900 mg 100 mL/hr over 30 Minutes Intravenous Every 8 hours 07/26/18 0347     07/26/18 0045  clindamycin (CLEOCIN) IVPB 900 mg     900 mg 100 mL/hr over 30 Minutes Intravenous  Once 07/26/18 0044 07/26/18 0325        Objective:   Vitals:   07/26/18 1805 07/26/18 2339 07/27/18 0521 07/27/18 1349  BP: (!) 122/56 102/61 (!) 106/58 106/60  Pulse: (!) 54 70 67 79  Resp: 18 16 17 16   Temp: 100 F (37.8 C) 98.6 F (37 C) 98.7 F (37.1 C) 99.1 F (37.3 C)  TempSrc: Oral Oral Oral Oral  SpO2: 97% 95% 97% 94%    Weight:      Height:        Wt Readings from Last 3 Encounters:  07/26/18 64.8 kg  01/16/18 59.9 kg  07/29/15 58.5 kg     Intake/Output Summary (Last 24 hours) at 07/27/2018 1431 Last data filed at 07/27/2018 0915 Gross per 24 hour  Intake 669.23 ml  Output -  Net 669.23 ml     Physical Exam  Gen: not in distress HEENT:  moist mucosa, supple neck Chest: clear b/l, no added sounds CVS: N S1&S2, no murmurs,  GI: soft, NT, ND,  Musculoskeletal: warm, significant swelling of the left lower leg extending up to the thigh, left lower leg erythema appears to have improved     Data Review:    CBC Recent Labs  Lab 07/25/18 1944 07/26/18 0524  WBC 9.8 8.6  HGB 12.6 11.8*  HCT 38.3 36.9  PLT 299 250  MCV 93.9 93.4  MCH 30.9 29.9  MCHC 32.9 32.0  RDW 13.0 13.0  LYMPHSABS 1.5  --   MONOABS 0.8  --   EOSABS 0.7*  --   BASOSABS 0.1  --     Chemistries  Recent Labs  Lab 07/25/18 1944 07/26/18 0524  NA 140 139  K 3.6 3.6  CL 108 109  CO2 24 21*  GLUCOSE 99 117*  BUN 25* 24*  CREATININE 1.00 0.88  CALCIUM 9.1 8.5*  AST 22  --   ALT 26  --   ALKPHOS 92  --   BILITOT 0.5  --    ------------------------------------------------------------------------------------------------------------------ No results for input(s): CHOL, HDL, LDLCALC, TRIG, CHOLHDL, LDLDIRECT in the last 72 hours.  No results found for: HGBA1C ------------------------------------------------------------------------------------------------------------------ No results for input(s): TSH, T4TOTAL, T3FREE, THYROIDAB in the last 72 hours.  Invalid input(s): FREET3 ------------------------------------------------------------------------------------------------------------------ No results for  input(s): VITAMINB12, FOLATE, FERRITIN, TIBC, IRON, RETICCTPCT in the last 72 hours.  Coagulation profile No results for input(s): INR, PROTIME in the last 168 hours.  Recent Labs    07/25/18 2151   DDIMER 7.38*    Cardiac Enzymes No results for input(s): CKMB, TROPONINI, MYOGLOBIN in the last 168 hours.  Invalid input(s): CK ------------------------------------------------------------------------------------------------------------------ No results found for: BNP  Inpatient Medications  Scheduled Meds: . buPROPion  450 mg Oral Daily  . cyclobenzaprine  5 mg Oral Daily  . dexmethylphenidate  20 mg Oral BID  . escitalopram  40 mg Oral Daily  . methocarbamol  500 mg Oral q morning - 10a  . rivaroxaban  15 mg Oral BID  . thyroid  60 mg Oral Daily  . topiramate  100 mg Oral BID   Continuous Infusions: . sodium chloride Stopped (07/26/18 1627)  . clindamycin (CLEOCIN) IV 900 mg (07/27/18 0918)   PRN Meds:.sodium chloride, acetaminophen **OR** acetaminophen, ALPRAZolam, HYDROcodone-acetaminophen, ondansetron **OR** ondansetron (ZOFRAN) IV  Micro Results Recent Results (from the past 240 hour(s))  Blood culture (routine x 2)     Status: None (Preliminary result)   Collection Time: 07/26/18  1:17 AM  Result Value Ref Range Status   Specimen Description BLOOD LEFT FOREARM  Final   Special Requests   Final    BOTTLES DRAWN AEROBIC ONLY Blood Culture adequate volume   Culture   Final    NO GROWTH 1 DAY Performed at [facility], [address] [place], [place] [address]    Report Status PENDING  Incomplete  Blood culture (routine x 2)     Status: None (Preliminary result)   Collection Time: 07/26/18  1:24 AM  Result Value Ref Range Status   Specimen Description BLOOD RIGHT WRIST  Final   Special Requests   Final    BOTTLES DRAWN AEROBIC ONLY Blood Culture adequate volume   Culture   Final    NO GROWTH 1 DAY Performed at [facility], [address], [place], [place] [address]    Report Status PENDING  Incomplete    Radiology Reports Ct Angio Chest Pe W Or Wo Contrast  Result Date: 07/26/2018 CLINICAL DATA:  Chest pain, suspect pulmonary embolism. EXAM:  CT ANGIOGRAPHY CHEST WITH CONTRAST TECHNIQUE: Multidetector CT imaging of the chest was performed using the standard protocol during bolus administration of intravenous contrast. Multiplanar CT image reconstructions and MIPs were obtained to evaluate the vascular anatomy. CONTRAST:  ISOVUE-370 IOPAMIDOL (ISOVUE-370) INJECTION 76% COMPARISON:  Chest two-view 04/21/2014 FINDINGS: Cardiovascular: Negative for pulmonary embolism. Mild pulmonary artery enlargement bilaterally. Negative for aortic aneurysm or dissection. Minimal coronary calcification. Heart size within normal limits. Mediastinum/Nodes: Negative for mass or adenopathy. Lungs/Pleura: Mild bibasilar atelectasis. Negative for infiltrate effusion or mass lesion. Upper Abdomen: Negative Musculoskeletal: Degenerative changes in the thoracic spine. No acute skeletal abnormality Review of the MIP images confirms the above findings. IMPRESSION: 1. Negative for pulmonary embolism. Mild pulmonary enlargement suggesting pulmonary artery hypertension 2. Mild bibasilar atelectasis 3. No acute abnormality. Electronically Signed   By: Marlan Palau M.D.   On: 07/26/2018 13:51   Ct Extremity Lower Left W Contrast  Result Date: 07/26/2018 CLINICAL DATA:  New left lower extremity swelling and erythema. Concern for DVT. EXAM: CT OF THE LOWER LEFT EXTREMITY WITH CONTRAST TECHNIQUE: Multidetector CT imaging of the lower left extremity was performed according to the standard protocol following intravenous contrast administration. COMPARISON:  Same day radiographs of the pelvis and left  hip. CONTRAST:  100mL OMNIPAQUE IOHEXOL 300 MG/ML  SOLN FINDINGS: Bones/Joint/Cartilage Extensive metallic streak artifacts limit assessment about the left hip. 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 No new fracture is identified. Interbody  cages are identified of the lower lumbar spine from L4 through S1 without complicating features. Mild joint space narrowing of the knee and included ankle. No acute fracture or bone destruction. No joint dislocation. No joint effusion. Ligaments Suboptimally assessed by CT. Muscles and Tendons No intramuscular hemorrhage or mass. Myositis is difficult to assess on CT. No enhancing fluid collection or intramuscular mass. Mild fatty atrophy of the hamstring muscles and induration of adjacent intramuscular fat possibly representing areas of mild fat necrosis. Soft tissues Diffuse subcutaneous soft tissue edema and skin thickening the included thigh and leg more so laterally. IMPRESSION: 1. Extensive metallic streak artifacts limit assessment about the left hip. 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 2. No acute fracture or bone destruction. 3. Diffuse subcutaneous soft tissue edema and skin thickening of the included thigh and leg more so laterally consistent with cellulitis. 4. Suboptimal assessment of deep venous system the left leg for thrombosis. Electronically Signed   By: Tollie Ethavid  Kwon M.D.   On: 07/26/2018 00:38   Dg Hip Unilat With Pelvis 2-3 Views Left  Result Date: 07/25/2018 CLINICAL DATA:  68 y/o F; history of multiple hip surgeries. Left hip swelling from the waist down. Fall. EXAM: DG HIP (WITH OR WITHOUT PELVIS) 2-3V LEFT COMPARISON:  08/04/2013 abdomen radiographs. 04/16/2011 left hip radiographs. FINDINGS: No acute fracture or dislocation identified. Right total hip prosthesis is partially visualized without periprosthetic lucency or fracture. The left hip prosthesis protrudes through the acetabulum with the acetabular component renal for spina plate fixed to the ileum by multiple screws. There is surrounding heterotopic ossification in high  attenuation debris within the soft tissues. No acute fracture is identified. Lower lumbar fusion changes noted. IMPRESSION: 1. No acute fracture or dislocation identified. 2. Bilateral total hip arthroplasty. The left-sided prosthesis is protruded through the acetabulum and fixed to the ileum with a plate reinforcement. No prior study to assess hardware stability. Electronically Signed   By: Mitzi HansenLance  Furusawa-Stratton M.D.   On: 07/25/2018 22:33   Vas [place] Lower Extremity Venous (dvt)  Result Date: 07/26/2018  Lower Venous Study Other Indications: Left leg pain, erythema. Performing Technologist: Jeb LeveringJill Parker RDMS, RVT  Examination Guidelines: A complete evaluation includes B-mode imaging, spectral Doppler, color Doppler, and power Doppler as needed of all accessible portions of each vessel. Bilateral testing is considered an integral part of a complete examination. Limited examinations for reoccurring indications may be performed as noted.  Right Venous Findings: +---+---------------+---------+-----------+----------+-------+    CompressibilityPhasicitySpontaneityPropertiesSummary +---+---------------+---------+-----------+----------+-------+ CFVFull           Yes      Yes                          +---+---------------+---------+-----------+----------+-------+  Left Venous Findings: +------------------+---------------+---------+-----------+----------+-------+                   CompressibilityPhasicitySpontaneityPropertiesSummary +------------------+---------------+---------+-----------+----------+-------+ CFV               None  No       No                   Acute   +------------------+---------------+---------+-----------+----------+-------+ SFJ               None                                         Acute   +------------------+---------------+---------+-----------+----------+-------+ FV Prox           None                                         Acute    +------------------+---------------+---------+-----------+----------+-------+ FV Mid            None                                         Acute   +------------------+---------------+---------+-----------+----------+-------+ FV Distal         None                                         Acute   +------------------+---------------+---------+-----------+----------+-------+ PFV               None                                         Acute   +------------------+---------------+---------+-----------+----------+-------+ POP               None           No       No                   Acute   +------------------+---------------+---------+-----------+----------+-------+ PTV               None                                         Acute   +------------------+---------------+---------+-----------+----------+-------+ PERO              None                                         Acute   +------------------+---------------+---------+-----------+----------+-------+ Gastroc           None                                         Acute   +------------------+---------------+---------+-----------+----------+-------+ Ex Iliac distal   None           No       No                   Acute   +------------------+---------------+---------+-----------+----------+-------+ Ex Iliac  prox- mid               Yes      Yes                          +------------------+---------------+---------+-----------+----------+-------+    Summary: Right: No evidence of common femoral vein obstruction. Left: Findings consistent with acute deep vein thrombosis involving the left common femoral vein, left femoral vein, left proximal profunda vein, left popliteal vein, left posterior tibial vein, left peroneal vein, and left gastrocnemius vein. Thrombosis  extends up to distal external iliac vein.  *See table(s) above for measurements and observations. Electronically signed by Coral ElseVance Brabham MD on  07/26/2018 at 6:01:14 PM.    Final     Time Spent in minutes  25   Laconya Clere M.D on 07/27/2018 at 2:31 PM  Between 7am to 7pm - Pager - [phone]  After 7pm go to [email or web address] - password [email or web address]  [facility] -  Office  [phone]

## 2018-07-27 NOTE — Care Management Note (Signed)
Case Management Note  Patient Details  Name: Danine Boughner MRN: [id number] Date of Birth: 12/25/1950  Subjective/Objective:                    Action/Plan:   Expected Discharge Date:  07/29/18               Expected Discharge Plan:     In-House Referral:     Discharge planning Services     Post Acute Care Choice:    Choice offered to:     DME Arranged:    DME Agency:     HH Arranged:    HH Agency:     Status of Service:     If discussed at [employer] of Stay Meetings, dates discussed:    Additional Comments: Per [place]/ [employer] [place] PDP [phone] both dosages of Xarelto are covered at tier 3 costing $40.00 for both 30 day supply and 90 day mail order is $90.00 mail order company is [facility] [phone] and no prior Berkley Harvey is required the generic was not on formulary   Caren Macadam 07/27/2018, 3:26 PM

## 2018-07-27 NOTE — Evaluation (Signed)
Physical Therapy Evaluation Patient Details Name: [name] MRN: [id number] DOB: 06/18/1951 Today's Date: 07/27/2018   History of Present Illness  Pt is a 68 y.o. female admitted 07/26/18 with c/o LLE swelling and erythema; pt denies trauma. (+) LLE DVT. Also treated for cellulitis. CT angiogram negative for PE; suggestive of pulmonary HTN. PMH includes CKD, fibromyalgia, ADD, migraine.    Clinical Impression  Pt presents with an overall decrease in functional mobility secondary to above. PTA, pt mod indep with SPC and lives with son. Today, pt indep with bed mobility; able to stand and amb short distance with RW at supervision-level. Pt with c/o L hip pain, but more limited by c/o dizziness, unable to perform further mobility secondary to this (see BP values below; (-) orthostatic hypotension). Pt reports occipital headaches and dizziness past couple months. Increased time spent discussing safety at home and fall risk reduction. Pt would benefit from continued acute PT services to maximize functional mobility and independence prior to d/c with HHPT services.  Orthostatic BPs  Supine 117/67  Sitting 120/72  Sitting after 2 min 120/63  Standing 116/60s       Follow Up Recommendations Home health PT;Supervision for mobility/OOB    Equipment Recommendations  None recommended by PT    Recommendations for Other Services       Precautions / Restrictions Precautions Precautions: Fall Restrictions Weight Bearing Restrictions: No      Mobility  Bed Mobility Overal bed mobility: Modified Independent             General bed mobility comments: Increased time and effort; bed flat. C/o dizziness sitting EOB, needing to lay back down 2x (BP stable)  Transfers Overall transfer level: Needs assistance Equipment used: Rolling walker (2 wheeled) Transfers: Sit to/from Stand Sit to Stand: Supervision         General transfer comment: C/o dizziness upon standing (BP  stable)  Ambulation/Gait Ambulation/Gait assistance: Supervision Gait Distance (Feet): 5 Feet Assistive device: Rolling walker (2 wheeled) Gait Pattern/deviations: Step-to pattern;Decreased weight shift to left;Antalgic Gait velocity: Decreased Gait velocity interpretation: 1.31 - 2.62 ft/sec, indicative of limited community ambulator General Gait Details: Antalgic steps with RW and supervision for safety; further distance limited secondary to dizziness  Stairs            Wheelchair Mobility    Modified Rankin (Stroke Patients Only)       Balance Overall balance assessment: Needs assistance   Sitting balance-Leahy Scale: Good       Standing balance-Leahy Scale: Poor Standing balance comment: Reliant on UE support secondary to pain                             Pertinent Vitals/Pain Pain Assessment: Faces Faces Pain Scale: Hurts even more Pain Location: L hip Pain Descriptors / Indicators: Sore;Grimacing;Guarding Pain Intervention(s): Limited activity within patient's tolerance;Monitored during session;Premedicated before session    Home Living Family/patient expects to be discharged to:: Private residence Living Arrangements: Children Available Help at Discharge: Family;Available 24 hours/day Type of Home: House Home Access: Level entry     Home Layout: Two level;Able to live on main level with bedroom/bathroom Home Equipment: Dan HumphreysWalker - 2 wheels;Walker - 4 wheels;Cane - single point      Prior Function Level of Independence: Independent with assistive device(s)         Comments: Indep with SPC until leg pain too severe and using RW. Drives. Keeps rollator in shower as  shower seat; only showers when son home in case she needs assist     Hand Dominance        Extremity/Trunk Assessment   Upper Extremity Assessment Upper Extremity Assessment: Overall WFL for tasks assessed    Lower Extremity Assessment Lower Extremity Assessment: LLE  deficits/detail LLE Deficits / Details: LLE swelling/redness; able to perform SLR through partial range, knee flex/ext functionally at least 3/5; decreased ROM secondary to edema LLE: Unable to fully assess due to pain LLE Coordination: decreased gross motor       Communication   Communication: No difficulties  Cognition Arousal/Alertness: Awake/alert Behavior During Therapy: WFL for tasks assessed/performed Overall Cognitive Status: Within Functional Limits for tasks assessed                                 General Comments: Per chart, h/o ADD; pt very tangential with speech and stories, requiring frequent cues to redirect to conversation and focus on task      General Comments General comments (skin integrity, edema, etc.): Supine BP 117/67, seated BP 120/72, seated 2 min BP 120/63, standing BP 116/60s    Exercises     Assessment/Plan    PT Assessment Patient needs continued PT services  PT Problem List Decreased strength;Decreased activity tolerance;Decreased balance;Decreased mobility;Pain       PT Treatment Interventions DME instruction;Gait training;Stair training;Functional mobility training;Therapeutic activities;Therapeutic exercise;Balance training;Patient/family education    PT Goals (Current goals can be found in the Care Plan section)  Acute Rehab PT Goals Patient Stated Goal: Interested in HHPT services PT Goal Formulation: With patient Time For Goal Achievement: 08/10/18 Potential to Achieve Goals: Good    Frequency Min 3X/week   Barriers to discharge Decreased caregiver support      Co-evaluation               AM-PAC PT "6 Clicks" Mobility  Outcome Measure Help needed turning from your back to your side while in a flat bed without using bedrails?: None Help needed moving from lying on your back to sitting on the side of a flat bed without using bedrails?: None Help needed moving to and from a bed to a chair (including a  wheelchair)?: A Little Help needed standing up from a chair using your arms (e.g., wheelchair or bedside chair)?: A Little Help needed to walk in hospital room?: A Little Help needed climbing 3-5 steps with a railing? : A Lot 6 Click Score: 19    End of Session Equipment Utilized During Treatment: Gait belt Activity Tolerance: Patient limited by pain;Other (comment)(Limited by dizziness) Patient left: in bed;with call bell/phone within reach;with bed alarm set Nurse Communication: Mobility status PT Visit Diagnosis: Other abnormalities of gait and mobility (R26.89);Pain Pain - Right/Left: Left Pain - part of body: Hip    Time: [id number] PT Time Calculation (min) (ACUTE ONLY): 26 min   Charges:   PT Evaluation $PT Eval Moderate Complexity: 1 Mod PT Treatments $Self Care/Home Management: 8-22      Ina HomesJaclyn Orlinda Slomski, PT, DPT Acute Rehabilitation Services  Pager [phone] Office [phone]  Malachy ChamberJaclyn L Bettina Warn 07/27/2018, 2:00 PM

## 2018-07-28 DIAGNOSIS — L03116 Cellulitis of left lower limb: Secondary | ICD-10-CM | POA: Diagnosis present

## 2018-07-28 DIAGNOSIS — I824Z2 Acute embolism and thrombosis of unspecified deep veins of left distal lower extremity: Secondary | ICD-10-CM | POA: Diagnosis present

## 2018-07-28 MED ORDER — CLINDAMYCIN HCL 150 MG PO CAPS: 450.0000 mg | ORAL_CAPSULE | Freq: Three times a day (TID) | ORAL | 0 refills | Status: AC

## 2018-07-28 MED ORDER — RIVAROXABAN (XARELTO) VTE STARTER PACK (15 & 20 MG): ORAL_TABLET | ORAL | 0 refills | Status: DC

## 2018-07-28 MED ORDER — HYDROCODONE-ACETAMINOPHEN 5-325 MG PO TABS: 1.0000 | ORAL_TABLET | Freq: Three times a day (TID) | ORAL | 0 refills | Status: DC | PRN

## 2018-07-28 NOTE — Care Management Important Message (Signed)
Important Message  Patient Details  Name: [name] MRN: [id number] Date of Birth: 1951/06/01   Medicare Important Message Given:  Yes    Chelsei Mcchesney 07/28/2018, 3:50 PM

## 2018-07-28 NOTE — Discharge Summary (Signed)
Physician Discharge Summary  [name] AVW:[id number] DOB: 19-Jul-1951 DOA: 07/25/2018  PCP: Lenice Llamas, NP  Admit date: 07/25/2018 Discharge date: 07/28/2018  Admitted From: Home Disposition: Home  Recommendations for Outpatient Follow-up:  1. [facility]. 2. Patient is being started on Xarelto for left lower leg DVT with plan to treat for at least 3-6 months. 3. Patient will complete 10-day course of antibiotics after 1/16.  Home Health: PT Equipment/Devices: Has walker at home  Discharge Condition: Fair CODE STATUS: Full code Diet recommendation: Regular   Discharge Diagnoses:  Principal Problem:   Lower leg DVT (deep venous thromboembolism), acute, left (HCC)   Active Problems:   Ekbom's delusional parasitosis (HCC)   Anxiety   Acquired hypothyroidism   Depression   Leg edema, left   Cellulitis of left lower extremity Pulmonary artery hypertension  Brief narrative/HPI 68 year old female with hypothyroidism ADD, depression, chronic pain, migraine, cervical degenerative disease, with strong delusion of parasitic infection of her lungs presented to the ED with increasing pain and swelling of the left lower extremity for past 3 weeks.  She also noticed increasing erythema of the left lower leg associated with subjective fever and chills.  Denies any trauma, recent illness or surgery. In the ED d-dimer was elevated, CT venogram was suboptimal for DVT but showed signs concerning for lower extremity cellulitis.  Patient admitted and further work-up with Doppler lower extremity showed acute DVT involving left common femoral vein, left femoral, left proximal profunda, left popliteal, left posterior tibial, left peroneal and left gastrocnemius vein, with thrombosis extending up to distal external iliac vein.  Hospital course Principal Problem: Acute left lower extremity DVT Extensive DVT involving the left lower  extremity extending up to distal external iliac vein.  Was placed on Lovenox, now transition to Xarelto. No clear cause for her symptoms.  Will treat for at least 3-6 months. CT angiogram of the chest negative for PE with mild pulmonary artery hypertension. Patient provided clear instructions regarding her DVT and need for anticoagulation including its benefits and side effects.    Active Problems: Left lower leg cellulitis Placed on empiric clindamycin.  Clinically improved.  Will treat for another 7 days.    Ekbom's delusional parasitosis (HCC) Patient was seen at [facility] previously.  Was seen by ENT few months back where she had similar concern of having parasites in her lung and abnormal movement of her tongue..  Reportedly several years back she was seen by ID (Dr. Maurice March) who had clearly informed her she does not have any parasitic infection. ENT recommended that she see another infectious disease if she has significant concerns of having parasitosis.  I have reviewed previous imaging (MRI of the cervical, thoracic and lumbar spine) were done back in 2018).  She does not have any findings suggestive of parasitosis. I have instructed her to establish care with outpatient PCP who can refer her to ID if there are any concerning symptoms.     Acquired hypothyroidism Continue Synthroid.  Anxiety and depression/ADD Resume home medications.  Follows with psychiatrist at [facility].  She is no longer prescribed Xanax at present.  Chronic pain Home medication listed as tramadol and Flexeril but not sure she has been taking them.  I have ordered short course of Vicodin for her left leg pain.  Lower extremity pain and swelling.   Seen by PT and recommends home health PT.      Family Communication  : None  at bedside  Disposition Plan  : Home   Consults  : None  Procedures  : CT of the left lower extremity, CT angiogram of the chest, Doppler lower extremity    Discharge  Instructions   Allergies as of 07/28/2018      Reactions   Amoxicillin-pot Clavulanate Nausea And Vomiting, Other (See Comments)   Clarithromycin Other (See Comments)   DOES NOT REMEMBER REACTION DOES NOT REMEMBER REACTION   Fentanyl Nausea And Vomiting, Other (See Comments)   FENTANYL PATCH FENTANYL PATCH FENTANYL PATCH   Levofloxacin Swelling   Face,eye swelling Face,eye swelling   Lorazepam Other (See Comments)   DO NOT PUT ANY ATIVAN ON PATIENT'S IV POST SURGERY! DO NOT PUT ANY ATIVAN ON PATIENT'S IV POST SURGERY!   Doxycycline Nausea And Vomiting   Penicillins Hives   Sulfa Antibiotics    Per pt: unknown      Medication List    STOP taking these medications   busPIRone 5 MG tablet Commonly known as:  BUSPAR   XANAX 0.25 MG tablet Generic drug:  ALPRAZolam     TAKE these medications   benzonatate 100 MG capsule Commonly known as:  TESSALON Take 1 capsule (100 mg total) by mouth every 8 (eight) hours. What changed:    when to take this  reasons to take this   buPROPion HCl ER (XL) 450 MG Tb24 Take 450 mg by mouth daily.   clindamycin 150 MG capsule Commonly known as:  CLEOCIN Take 3 capsules (450 mg total) by mouth 3 (three) times daily for 7 days.   cyclobenzaprine 5 MG tablet Commonly known as:  FLEXERIL Take 5 mg by mouth See admin instructions. Take 1 tablet daily and as needed for muscle spasms   dexmethylphenidate 20 MG 24 hr capsule Commonly known as:  FOCALIN XR Take 20 mg by mouth 2 (two) times daily.   dexmethylphenidate 10 MG tablet Commonly known as:  FOCALIN Take 10 mg by mouth daily as needed (if second dose of 20mg  capsule isn't taken).   diclofenac 50 MG EC tablet Commonly known as:  VOLTAREN Take 100 mg by mouth every morning.   escitalopram 20 MG tablet Commonly known as:  LEXAPRO Take 40 mg by mouth daily.   HYDROcodone-acetaminophen 5-325 MG tablet Commonly known as:  NORCO/VICODIN Take 1 tablet by mouth every 8 (eight)  hours as needed for moderate pain.   ketoconazole 2 % shampoo Commonly known as:  NIZORAL Apply 1 application topically 2 (two) times a week.   methocarbamol 500 MG tablet Commonly known as:  ROBAXIN Take 500 mg by mouth every morning.   NATURE-THROID 65 MG tablet Generic drug:  thyroid Take 65 mg by mouth daily.   Rivaroxaban 15 & 20 MG Tbpk Take as directed on package: Start with one 15mg  tablet by mouth twice a day with food. On Day 22, switch to one 20mg  tablet once a day with food.   topiramate 100 MG tablet Commonly known as:  TOPAMAX Take 100 mg by mouth every morning.   VITAMIN D PO Take 1 capsule by mouth every other day.      Follow-up Information    [facility] Follow up.   Why:  Febuary 3, 2020 at 0930  Contact information: [address] [place] [address] [phone]         Allergies  Allergen Reactions  . Amoxicillin-Pot Clavulanate Nausea And Vomiting and Other (See Comments)  . Clarithromycin  Other (See Comments)    DOES NOT REMEMBER REACTION DOES NOT REMEMBER REACTION   . Fentanyl Nausea And Vomiting and Other (See Comments)    FENTANYL PATCH FENTANYL PATCH FENTANYL PATCH   . Levofloxacin Swelling    Face,eye swelling Face,eye swelling   . Lorazepam Other (See Comments)    DO NOT PUT ANY ATIVAN ON PATIENT'S IV POST SURGERY! DO NOT PUT ANY ATIVAN ON PATIENT'S IV POST SURGERY!   . Doxycycline Nausea And Vomiting  . Penicillins Hives  . Sulfa Antibiotics     Per pt: unknown       Procedures/Studies: Ct Angio Chest Pe W Or Wo Contrast  Result Date: 07/26/2018 CLINICAL DATA:  Chest pain, suspect pulmonary embolism. EXAM: CT ANGIOGRAPHY CHEST WITH CONTRAST TECHNIQUE: Multidetector CT imaging of the chest was performed using the standard protocol during bolus administration of intravenous contrast. Multiplanar CT image reconstructions and MIPs were obtained to evaluate the vascular  anatomy. CONTRAST:  ISOVUE-370 IOPAMIDOL (ISOVUE-370) INJECTION 76% COMPARISON:  Chest two-view 04/21/2014 FINDINGS: Cardiovascular: Negative for pulmonary embolism. Mild pulmonary artery enlargement bilaterally. Negative for aortic aneurysm or dissection. Minimal coronary calcification. Heart size within normal limits. Mediastinum/Nodes: Negative for mass or adenopathy. Lungs/Pleura: Mild bibasilar atelectasis. Negative for infiltrate effusion or mass lesion. Upper Abdomen: Negative Musculoskeletal: Degenerative changes in the thoracic spine. No acute skeletal abnormality Review of the MIP images confirms the above findings. IMPRESSION: 1. Negative for pulmonary embolism. Mild pulmonary enlargement suggesting pulmonary artery hypertension 2. Mild bibasilar atelectasis 3. No acute abnormality. Electronically Signed   By: Marlan Palau M.D.   On: 07/26/2018 13:51   Ct Extremity Lower Left W Contrast  Result Date: 07/26/2018 CLINICAL DATA:  New left lower extremity swelling and erythema. Concern for DVT. EXAM: CT OF THE LOWER LEFT EXTREMITY WITH CONTRAST TECHNIQUE: Multidetector CT imaging of the lower left extremity was performed according to the standard protocol following intravenous contrast administration. COMPARISON:  Same day radiographs of the pelvis and left hip. CONTRAST:  OMNIPAQUE IOHEXOL 300 MG/ML  SOLN FINDINGS: Bones/Joint/Cartilage Extensive metallic streak artifacts limit assessment about the left hip. 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 No new fracture is identified. Interbody cages are identified of the lower lumbar spine from L4 through S1 without complicating features. Mild joint space narrowing of the knee and included ankle. No acute fracture or bone destruction. No joint dislocation. No joint effusion. Ligaments Suboptimally assessed  by CT. Muscles and Tendons No intramuscular hemorrhage or mass. Myositis is difficult to assess on CT. No enhancing fluid collection or intramuscular mass. Mild fatty atrophy of the hamstring muscles and induration of adjacent intramuscular fat possibly representing areas of mild fat necrosis. Soft tissues Diffuse subcutaneous soft tissue edema and skin thickening the included thigh and leg more so laterally. IMPRESSION: 1. Extensive metallic streak artifacts limit assessment about the left hip. 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 2. No acute fracture or bone destruction. 3. Diffuse subcutaneous soft tissue edema and skin thickening of the included thigh and leg more so laterally consistent with cellulitis. 4. Suboptimal assessment of deep venous system the left leg for thrombosis. Electronically Signed   By: Tollie Eth M.D.   On: 07/26/2018 00:38   Dg Hip Unilat With  Pelvis 2-3 Views Left  Result Date: 07/25/2018 CLINICAL DATA:  68 y/o F; history of multiple hip surgeries. Left hip swelling from the waist down. Fall. EXAM: DG HIP (WITH OR WITHOUT PELVIS) 2-3V LEFT COMPARISON:  08/04/2013 abdomen radiographs. 04/16/2011 left hip radiographs. FINDINGS: No acute fracture or dislocation identified. Right total hip prosthesis is partially visualized without periprosthetic lucency or fracture. The left hip prosthesis protrudes through the acetabulum with the acetabular component renal for spina plate fixed to the ileum by multiple screws. There is surrounding heterotopic ossification in high attenuation debris within the soft tissues. No acute fracture is identified. Lower lumbar fusion changes noted. IMPRESSION: 1. No acute fracture or dislocation identified. 2. Bilateral total hip arthroplasty. The left-sided prosthesis is protruded through the acetabulum and  fixed to the ileum with a plate reinforcement. No prior study to assess hardware stability. Electronically Signed   By: Mitzi Hansen M.D.   On: 07/25/2018 22:33   Vas [place] Lower Extremity Venous (dvt)  Result Date: 07/26/2018  Lower Venous Study Other Indications: Left leg pain, erythema. Performing Technologist: Jeb Levering RDMS, RVT  Examination Guidelines: A complete evaluation includes B-mode imaging, spectral Doppler, color Doppler, and power Doppler as needed of all accessible portions of each vessel. Bilateral testing is considered an integral part of a complete examination. Limited examinations for reoccurring indications may be performed as noted.  Right Venous Findings: +---+---------------+---------+-----------+----------+-------+    CompressibilityPhasicitySpontaneityPropertiesSummary +---+---------------+---------+-----------+----------+-------+ CFVFull           Yes      Yes                          +---+---------------+---------+-----------+----------+-------+  Left Venous Findings: +------------------+---------------+---------+-----------+----------+-------+                   CompressibilityPhasicitySpontaneityPropertiesSummary +------------------+---------------+---------+-----------+----------+-------+ CFV               None           No       No                   Acute   +------------------+---------------+---------+-----------+----------+-------+ SFJ               None                                         Acute   +------------------+---------------+---------+-----------+----------+-------+ FV Prox           None                                         Acute   +------------------+---------------+---------+-----------+----------+-------+ FV Mid            None                                         Acute   +------------------+---------------+---------+-----------+----------+-------+ FV Distal         None                                          Acute   +------------------+---------------+---------+-----------+----------+-------+  PFV               None                                         Acute   +------------------+---------------+---------+-----------+----------+-------+ POP               None           No       No                   Acute   +------------------+---------------+---------+-----------+----------+-------+ PTV               None                                         Acute   +------------------+---------------+---------+-----------+----------+-------+ PERO              None                                         Acute   +------------------+---------------+---------+-----------+----------+-------+ Gastroc           None                                         Acute   +------------------+---------------+---------+-----------+----------+-------+ Ex Iliac distal   None           No       No                   Acute   +------------------+---------------+---------+-----------+----------+-------+ Ex Iliac prox- mid               Yes      Yes                          +------------------+---------------+---------+-----------+----------+-------+    Summary: Right: No evidence of common femoral vein obstruction. Left: Findings consistent with acute deep vein thrombosis involving the left common femoral vein, left femoral vein, left proximal profunda vein, left popliteal vein, left posterior tibial vein, left peroneal vein, and left gastrocnemius vein. Thrombosis  extends up to distal external iliac vein.  *See table(s) above for measurements and observations. Electronically signed by Coral Else MD on 07/26/2018 at 6:01:14 PM.    Final        Subjective: Insist on getting an infectious disease evaluation while in the hospital.  Left leg pain and swelling better.  Discharge Exam: Vitals:   07/27/18 2142 07/28/18 0527  BP: 104/78 (!) 106/52  Pulse: 75 65  Resp: 16 16   Temp:  (!) 97 F (36.1 C)  SpO2: 95% 97%   Vitals:   07/27/18 1349 07/27/18 1657 07/27/18 2142 07/28/18 0527  BP: 106/60 115/78 104/78 (!) 106/52  Pulse: 79 75 75 65  Resp: 16 18 16 16   Temp: 99.1 F (37.3 C) 99.3 F (37.4 C)  (!) 97 F (36.1 C)  TempSrc: Oral Oral  Oral  SpO2: 94% 92% 95% 97%  Weight:      Height:  General: Elderly female not in distress HEENT: Moist mucosa, supple neck Chest: Clear bilaterally CVs: Normal S1-S2, no murmurs GI: Soft, nondistended, nontender Musculoskeletal: Warm, swelling over the left lower extremity extending to the thighs (improved since yesterday), no erythema or tenderness     The results of significant diagnostics from this hospitalization (including imaging, microbiology, ancillary and laboratory) are listed below for reference.     Microbiology: Recent Results (from the past 240 hour(s))  Blood culture (routine x 2)     Status: None (Preliminary result)   Collection Time: 07/26/18  1:17 AM  Result Value Ref Range Status   Specimen Description BLOOD LEFT FOREARM  Final   Special Requests   Final    BOTTLES DRAWN AEROBIC ONLY Blood Culture adequate volume   Culture   Final    NO GROWTH 2 DAYS Performed at [facility], [address] [place], [place] [address]    Report Status PENDING  Incomplete  Blood culture (routine x 2)     Status: None (Preliminary result)   Collection Time: 07/26/18  1:24 AM  Result Value Ref Range Status   Specimen Description BLOOD RIGHT WRIST  Final   Special Requests   Final    BOTTLES DRAWN AEROBIC ONLY Blood Culture adequate volume   Culture   Final    NO GROWTH 2 DAYS Performed at [facility], [address] [place], [place] [address]    Report Status PENDING  Incomplete     Labs: BNP (last 3 results) No results for input(s): BNP in the last 8760 hours. Basic Metabolic Panel: Recent Labs  Lab 07/25/18 1944 07/26/18 0524  NA 140 139  K 3.6 3.6  CL 108 109   CO2 24 21*  GLUCOSE 99 117*  BUN 25* 24*  CREATININE 1.00 0.88  CALCIUM 9.1 8.5*   Liver Function Tests: Recent Labs  Lab 07/25/18 1944  AST 22  ALT 26  ALKPHOS 92  BILITOT 0.5  PROT 6.7  ALBUMIN 3.6   No results for input(s): LIPASE, AMYLASE in the last 168 hours. No results for input(s): AMMONIA in the last 168 hours. CBC: Recent Labs  Lab 07/25/18 1944 07/26/18 0524  WBC 9.8 8.6  NEUTROABS 6.8  --   HGB 12.6 11.8*  HCT 38.3 36.9  MCV 93.9 93.4  PLT 299 250   Cardiac Enzymes: No results for input(s): CKTOTAL, CKMB, CKMBINDEX, TROPONINI in the last 168 hours. BNP: Invalid input(s): POCBNP CBG: No results for input(s): GLUCAP in the last 168 hours. D-Dimer Recent Labs    07/25/18 2151  DDIMER 7.38*   Hgb A1c No results for input(s): HGBA1C in the last 72 hours. Lipid Profile No results for input(s): CHOL, HDL, LDLCALC, TRIG, CHOLHDL, LDLDIRECT in the last 72 hours. Thyroid function studies No results for input(s): TSH, T4TOTAL, T3FREE, THYROIDAB in the last 72 hours.  Invalid input(s): FREET3 Anemia work up No results for input(s): VITAMINB12, FOLATE, FERRITIN, TIBC, IRON, RETICCTPCT in the last 72 hours. Urinalysis    Component Value Date/Time   COLORURINE YELLOW 07/29/2015 0456   APPEARANCEUR CLEAR 07/29/2015 0456   LABSPEC 1.006 07/29/2015 0456   PHURINE 6.0 07/29/2015 0456   GLUCOSEU NEGATIVE 07/29/2015 0456   HGBUR NEGATIVE 07/29/2015 0456   BILIRUBINUR NEGATIVE 07/29/2015 0456   KETONESUR NEGATIVE 07/29/2015 0456   PROTEINUR NEGATIVE 07/29/2015 0456   UROBILINOGEN 0.2 08/04/2013 1750   NITRITE NEGATIVE 07/29/2015 0456   LEUKOCYTESUR SMALL (A) 07/29/2015 0456   Sepsis  Labs Invalid input(s): PROCALCITONIN,  WBC,  LACTICIDVEN Microbiology Recent Results (from the past 240 hour(s))  Blood culture (routine x 2)     Status: None (Preliminary result)   Collection Time: 07/26/18  1:17 AM  Result Value Ref Range Status   Specimen Description  BLOOD LEFT FOREARM  Final   Special Requests   Final    BOTTLES DRAWN AEROBIC ONLY Blood Culture adequate volume   Culture   Final    NO GROWTH 2 DAYS Performed at [facility], [address] [place], [place] [address]    Report Status PENDING  Incomplete  Blood culture (routine x 2)     Status: None (Preliminary result)   Collection Time: 07/26/18  1:24 AM  Result Value Ref Range Status   Specimen Description BLOOD RIGHT WRIST  Final   Special Requests   Final    BOTTLES DRAWN AEROBIC ONLY Blood Culture adequate volume   Culture   Final    NO GROWTH 2 DAYS Performed at [facility], [address] [place], [place] [address]    Report Status PENDING  Incomplete     Time coordinating discharge: 35 minutes  SIGNED:   Eddie North, MD  [facility] 07/28/2018, 10:30 AM Pager   If 7PM-7AM, please contact night-coverage [email or web address] Password [email or web address]

## 2018-07-28 NOTE — Discharge Instructions (Addendum)
Deep Vein Thrombosis  Deep vein thrombosis (DVT) is a condition in which a blood clot forms in a deep vein, such as a lower leg, thigh, or arm vein. A clot is blood that has thickened into a gel or solid. This condition is dangerous. It can lead to serious and even life-threatening complications if the clot travels to the lungs and causes a blockage (pulmonary embolism). It can also damage veins in the leg. This can result in leg pain, swelling, discoloration, and sores (post-thrombotic syndrome). What are the causes? This condition may be caused by:  A slowdown of blood flow.  Damage to a vein.  A condition that causes blood to clot more easily, such as an inherited clotting disorder. What increases the risk? The following factors may make you more likely to develop this condition:  Being overweight.  Being older, especially over age 59.  Sitting or lying down for more than four hours.  Being in the hospital.  Lack of physical activity (sedentary lifestyle).  Pregnancy, being in childbirth, or having recently given birth.  Taking medicines that contain estrogen, such as medicines to prevent pregnancy.  Smoking.  A history of any of the following: ? Blood clots or a blood clotting disease. ? Peripheral vascular disease. ? Inflammatory bowel disease. ? Cancer. ? Heart disease. ? Genetic conditions that affect how your blood clots, such as Factor V Leiden mutation. ? Neurological diseases that affect your legs (leg paresis). ? A recent injury, such as a car accident. ? Major or lengthy surgery. ? A central line placed inside a large vein. What are the signs or symptoms? Symptoms of this condition include:  Swelling, pain, or tenderness in an arm or leg.  Warmth, redness, or discoloration in an arm or leg. If the clot is in your leg, symptoms may be more noticeable or worse when you stand or walk. Some people may not develop any symptoms. How is this diagnosed? This  condition is diagnosed with:  A medical history and physical exam.  Tests, such as: ? Blood tests. These are done to check how well your blood clots. ? Ultrasound. This is done to check for clots. ? Venogram. For this test, contrast dye is injected into a vein and X-rays are taken to check for any clots. How is this treated? Treatment for this condition depends on:  The cause of your DVT.  Your risk for bleeding or developing more clots.  Any other medical conditions that you have. Treatment may include:  Taking a blood thinner (anticoagulant). This type of medicine prevents clots from forming. It may be taken by mouth, injected under the skin, or injected through an IV (catheter).  Injecting clot-dissolving medicines into the affected vein (catheter-directed thrombolysis).  Having surgery. Surgery may be done to: ? Remove the clot. ? Place a filter in a large vein to catch blood clots before they reach the lungs. Some treatments may be continued for up to six months. Follow these instructions at home: If you are taking blood thinners:  Take the medicine exactly as told by your health care provider. Some blood thinners need to be taken at the same time every day. Do not skip a dose.  Talk with your health care provider before you take any medicines that contain aspirin or NSAIDs. These medicines increase your risk for dangerous bleeding.  Ask your health care provider about foods and drugs that could change the way the medicine works (may interact). Avoid those things if your  health care provider tells you to do so.  Blood thinners can cause easy bruising and may make it difficult to stop bleeding. Because of this: ? Be very careful when using knives, scissors, or other sharp objects. ? Use an electric razor instead of a blade. ? Avoid activities that could cause injury or bruising, and follow instructions about how to prevent falls.  Wear a medical alert bracelet or carry a  card that lists what medicines you take. General instructions  Take over-the-counter and prescription medicines only as told by your health care provider.  Return to your normal activities as told by your health care provider. Ask your health care provider what activities are safe for you.  Wear compression stockings if recommended by your health care provider.  Keep all follow-up visits as told by your health care provider. This is important. How is this prevented? To lower your risk of developing this condition again:  For 30 or more minutes every day, do an activity that: ? Involves moving your arms and legs. ? Increases your heart rate.  When traveling for longer than four hours: ? Exercise your arms and legs every hour. ? Drink plenty of water. ? Avoid drinking alcohol.  Avoid sitting or lying for a long time without moving your legs.  If you have surgery or you are hospitalized, ask about ways to prevent blood clots. These may include taking frequent walks or using anticoagulants.  Stay at a healthy weight.  If you are a woman who is older than age 73, avoid unnecessary use of medicines that contain estrogen, such as some birth control pills.  Do not use any products that contain nicotine or tobacco, such as cigarettes and e-cigarettes. This is especially important if you take estrogen medicines. If you need help quitting, ask your health care provider. Contact a health care provider if:  You miss a dose of your blood thinner.  Your menstrual period is heavier than usual.  You have unusual bruising. Get help right away if:  You have: ? New or increased pain, swelling, or redness in an arm or leg. ? Numbness or tingling in an arm or leg. ? Shortness of breath. ? Chest pain. ? A rapid or irregular heartbeat. ? A severe headache or confusion. ? A cut that will not stop bleeding.  There is blood in your vomit, stool, or urine.  You have a serious fall or accident,  or you hit your head.  You feel light-headed or dizzy.  You cough up blood. These symptoms may represent a serious problem that is an emergency. Do not wait to see if the symptoms will go away. Get medical help right away. Call your local emergency services (911 in the [place] Do not drive yourself to the hospital. Summary  Deep vein thrombosis (DVT) is a condition in which a blood clot forms in a deep vein, such as a lower leg, thigh, or arm vein.  Symptoms can include swelling, warmth, pain, and redness in your leg or arm.  This condition may be treated with a blood thinner (anticoagulant medicine), medicine that is injected to dissolve blood clots,compression stockings, or surgery.  If you are prescribed blood thinners, take them exactly as told. This information is not intended to replace advice given to you by your health care provider. Make sure you discuss any questions you have with your health care provider. Document Released: 07/05/2005 Document Revised: 12/03/2016 Document Reviewed: 12/03/2016 Elsevier Interactive Patient Education  2019 [employer].  Rivaroxaban oral tablets What is this medicine? RIVAROXABAN (ri va ROX a ban) is an anticoagulant (blood thinner). It is used to treat blood clots in the lungs or in the veins. It is also used after knee or hip surgeries to prevent blood clots. It is also used to lower the chance of stroke in people with a medical condition called atrial fibrillation. This medicine may be used for other purposes; ask your health care provider or pharmacist if you have questions. COMMON BRAND NAME(S): Xarelto, Xarelto Starter Pack What should I tell my health care provider before I take this medicine? They need to know if you have any of these conditions: -bleeding disorders -bleeding in the brain -blood in your stools (black or tarry stools) or if you have blood in your vomit -history of stomach bleeding -kidney disease -liver disease -low  blood counts, like low white cell, platelet, or red cell counts -recent or planned spinal or epidural procedure -take medicines that treat or prevent blood clots -an unusual or allergic reaction to rivaroxaban, other medicines, foods, dyes, or preservatives -pregnant or trying to get pregnant -breast-feeding How should I use this medicine? Take this medicine by mouth with a glass of water. Follow the directions on the prescription label. Take your medicine at regular intervals. Do not take it more often than directed. Do not stop taking except on your doctor's advice. Stopping this medicine may increase your risk of a blood clot. Be sure to refill your prescription before you run out of medicine. If you are taking this medicine after hip or knee replacement surgery, take it with or without food. If you are taking this medicine for atrial fibrillation, take it with your evening meal. If you are taking this medicine to treat blood clots, take it with food at the same time each day. If you are unable to swallow your tablet, you may crush the tablet and mix it in applesauce. Then, immediately eat the applesauce. You should eat more food right after you eat the applesauce containing the crushed tablet. Talk to your pediatrician regarding the use of this medicine in children. Special care may be needed. Overdosage: If you think you have taken too much of this medicine contact a poison control center or emergency room at once. NOTE: This medicine is only for you. Do not share this medicine with others. What if I miss a dose? If you take your medicine once a day and miss a dose, take the missed dose as soon as you remember. If it is almost time for your next dose, take only that dose. Do not take double or extra doses. If you take your medicine twice a day and miss a dose, take the missed dose immediately. In this instance, 2 tablets may be taken at the same time. The next day you should take 1 tablet twice a  day as directed. What may interact with this medicine? Do not take this medicine with any of the following medications: -defibrotide This medicine may also interact with the following medications: -aspirin and aspirin-like medicines -certain antibiotics like erythromycin, azithromycin, and clarithromycin -certain medicines for fungal infections like ketoconazole and itraconazole -certain medicines for irregular heart beat like amiodarone, quinidine, dronedarone -certain medicines for seizures like carbamazepine, phenytoin -certain medicines that treat or prevent blood clots like warfarin, enoxaparin, and dalteparin -conivaptan -felodipine -indinavir -lopinavir; ritonavir -NSAIDS, medicines for pain and inflammation, like ibuprofen or naproxen -ranolazine -rifampin -ritonavir -SNRIs, medicines for depression, like desvenlafaxine, duloxetine, levomilnacipran, venlafaxine -SSRIs,  medicines for depression, like citalopram, escitalopram, fluoxetine, fluvoxamine, paroxetine, sertraline -St. John's wort -verapamil This list may not describe all possible interactions. Give your health care provider a list of all the medicines, herbs, non-prescription drugs, or dietary supplements you use. Also tell them if you smoke, drink alcohol, or use illegal drugs. Some items may interact with your medicine. What should I watch for while using this medicine? Visit your healthcare professional for regular checks on your progress. You may need blood work done while you are taking this medicine. Your condition will be monitored carefully while you are receiving this medicine. It is important not to miss any appointments. Avoid sports and activities that might cause injury while you are using this medicine. Severe falls or injuries can cause unseen bleeding. Be careful when using sharp tools or knives. Consider using an Neurosurgeon. Take special care brushing or flossing your teeth. Report any injuries,  bruising, or red spots on the skin to your healthcare professional. If you are going to need surgery or other procedure, tell your healthcare professional that you are taking this medicine. Wear a medical ID bracelet or chain. Carry a card that describes your disease and details of your medicine and dosage times. What side effects may I notice from receiving this medicine? Side effects that you should report to your doctor or health care professional as soon as possible: -allergic reactions like skin rash, itching or hives, swelling of the face, lips, or tongue -back pain -redness, blistering, peeling or loosening of the skin, including inside the mouth -signs and symptoms of bleeding such as bloody or black, tarry stools; red or dark-brown urine; spitting up blood or brown material that looks like coffee grounds; red spots on the skin; unusual bruising or bleeding from the eye, gums, or nose -signs and symptoms of a blood clot such as chest pain; shortness of breath; pain, swelling, or warmth in the leg -signs and symptoms of a stroke such as changes in vision; confusion; trouble speaking or understanding; severe headaches; sudden numbness or weakness of the face, arm or leg; trouble walking; dizziness; loss of coordination Side effects that usually do not require medical attention (report to your doctor or health care professional if they continue or are bothersome): -dizziness -muscle pain This list may not describe all possible side effects. Call your doctor for medical advice about side effects. You may report side effects to FDA at 1-800-FDA-1088. Where should I keep my medicine? Keep out of the reach of children. Store at room temperature between 15 and 30 degrees C (59 and 86 degrees F). Throw away any unused medicine after the expiration date. NOTE: This sheet is a summary. It may not cover all possible information. If you have questions about this medicine, talk to your doctor,  pharmacist, or health care provider.  2019 Elsevier/Gold Standard (2017-06-30 11:37:12)

## 2018-07-28 NOTE — Progress Notes (Signed)
Pt has an order for heart monitor but no available heart monitor, On call X. Blount,NP made aware via text page.

## 2018-07-28 NOTE — Care Management Note (Signed)
Case Management Note  Patient Details  Name: [name] MRN: [id number] Date of Birth: [birth year]  Subjective/Objective:                    Action/Plan:  Patient's PCP has "closed her practice", per patient. Patient has follow up appointment at [facility] on August 21, 2018. Patient has information. Also explained to patient if she wanted a different PCP she can call number on her insurance card and be provided at complete list of MD's in network.   PT recommending home health , however, home health would not be able to start until patient seen by [facility]. Explained to patient. Patient gave name of DR Kenna Gilbert [phone] and said "he will sign orders.". Called DR Pollack's office patient has not been seen in over a year , so Dr Julius Bowels not able to sign home health orders.   Explained above to Dr Darnelle Catalan , she has agreed to sign home health orders until patient is seen August 21, 2018 at [facility].   Gave and explained 30 day free Xarelto card.   Patient voiced understanding.    Expected Discharge Date:  07/28/18               Expected Discharge Plan:  Home w Home Health Services  In-House Referral:     Discharge planning Services  CM Consult  Post Acute Care Choice:  Home Health Choice offered to:  Patient  DME Arranged:  N/A DME Agency:  NA  HH Arranged:  PT HH Agency:  [facility]  Status of Service:  Completed, signed off  If discussed at Long Length of Stay Meetings, dates discussed:    Additional Comments:  Kingsley Plan, RN 07/28/2018, 11:09 AM

## 2018-07-31 LAB — CULTURE, BLOOD (ROUTINE X 2)
Culture: NO GROWTH
Culture: NO GROWTH
Special Requests: ADEQUATE
Special Requests: ADEQUATE

## 2018-08-21 ENCOUNTER — Inpatient Hospital Stay: Payer: Self-pay | Admitting: Critical Care Medicine

## 2018-08-21 NOTE — Progress Notes (Deleted)
Subjective:    Patient ID: [name], female    DOB: [birth year], 68 y.o.   MRN: [id number]  68 y.o. F here for post hosp f/u :    Admit date: 07/25/2018 Discharge date: 07/28/2018  Admitted From: Home Disposition: Home  Recommendations for Outpatient Follow-up:  1. [facility]. 2. Patient is being started on Xarelto for left lower leg DVT with plan to treat for at least 3-6 months. 3. Patient will complete 10-day course of antibiotics after 1/16.  Home Health: PT Equipment/Devices: Has walker at home  Discharge Condition: Fair CODE STATUS: Full code Diet recommendation: Regular   Discharge Diagnoses:  Principal Problem:   Lower leg DVT (deep venous thromboembolism), acute, left (HCC)   Active Problems:   Ekbom's delusional parasitosis (HCC)   Anxiety   Acquired hypothyroidism   Depression   Leg edema, left   Cellulitis of left lower extremity Pulmonary artery hypertension  Brief narrative/HPI 68 year old female with hypothyroidism ADD, depression, chronic pain, migraine, cervical degenerative disease, with strong delusion of parasitic infection of her lungs presented to the ED with increasing pain and swelling of the left lower extremity for past 3 weeks. She also noticed increasing erythema of the left lower leg associated with subjective fever and chills. Denies any trauma, recent illness or surgery. In the ED d-dimer was elevated, CT venogram was suboptimal for DVT but showed signs concerning for lower extremity cellulitis. Patient admitted and further work-up with Doppler lower extremity showed acute DVT involving left common femoral vein, left femoral, left proximal profunda, left popliteal, left posterior tibial, left peroneal and left gastrocnemius vein, with thrombosis extending up to distal external iliac vein.  Hospital course Principal Problem: Acute left lower extremity  DVT Extensive DVT involving the left lower extremity extending up to distal external iliac vein. Was placed on Lovenox, now transition to Xarelto.No clear cause for her symptoms. Will treat for at least 3-6 months. CT angiogram of the chest negative for PE with mild pulmonary artery hypertension. Patient provided clear instructions regarding her DVT and need for anticoagulation including its benefits and side effects.    Active Problems: Left lower leg cellulitis Placed on empiric clindamycin.  Clinically improved.  Will treat for another 7 days.  Ekbom's delusional parasitosis (HCC) Patient was seen at [facility] previously. Was seen by ENT few months back where she had similar concern of having parasites in her lung and abnormal movement of her tongue.. Reportedly several years back she was seen by ID (Dr. Maurice March) who had clearly informed her she does not have any parasitic infection. ENT recommended that she see another infectious disease if she has significant concerns of having parasitosis.  I have reviewed previous imaging (MRI of the cervical, thoracic and lumbar spine) were done back in 2018).  She does not have any findings suggestive of parasitosis. I have instructed her to establish care with outpatient PCP who can refer her to ID if there are any concerning symptoms.   Acquired hypothyroidism Continue Synthroid.  Anxiety and depression/ADD Resume home medications.  Follows with psychiatrist at [facility].  She is no longer prescribed Xanax at present.  Chronic pain Home medication listed as tramadol and Flexeril but not sure she has been taking them.  I have ordered short course of Vicodin for her left leg pain.  Lower extremity pain and swelling.  Seen by PT and recommends home health PT.  Review of Systems     Objective:   Physical Exam        Assessment & Plan:

## 2018-08-23 ENCOUNTER — Telehealth: Payer: Self-pay | Admitting: *Deleted

## 2018-08-23 NOTE — Telephone Encounter (Signed)
Attempt to speak to patient regarding appointment on Monday, February 3rd. Given options to :  Reschedule  Call back for an appointment after he checks his availability  Come in for no show/ SAME DAY   Pt stated she looked up [facility] but did not see up to date information. Would like to do her research on the providers since she has an extensive history and would call our office back after research.

## 2020-09-12 ENCOUNTER — Encounter (HOSPITAL_COMMUNITY): Payer: Self-pay | Admitting: *Deleted

## 2020-09-12 ENCOUNTER — Other Ambulatory Visit: Payer: Self-pay

## 2020-09-12 ENCOUNTER — Emergency Department (HOSPITAL_BASED_OUTPATIENT_CLINIC_OR_DEPARTMENT_OTHER): Payer: Medicare Other

## 2020-09-12 ENCOUNTER — Emergency Department (HOSPITAL_COMMUNITY)
Admission: EM | Admit: 2020-09-12 | Discharge: 2020-09-13 | Disposition: A | Discharge status: Home / Self Care | Payer: Medicare Other | Attending: Emergency Medicine | Admitting: Emergency Medicine

## 2020-09-12 DIAGNOSIS — E039 Hypothyroidism, unspecified: Secondary | ICD-10-CM | POA: Insufficient documentation

## 2020-09-12 DIAGNOSIS — M7989 Other specified soft tissue disorders: Secondary | ICD-10-CM

## 2020-09-12 DIAGNOSIS — N189 Chronic kidney disease, unspecified: Secondary | ICD-10-CM | POA: Diagnosis not present

## 2020-09-12 DIAGNOSIS — M79605 Pain in left leg: Secondary | ICD-10-CM

## 2020-09-12 DIAGNOSIS — M25552 Pain in left hip: Secondary | ICD-10-CM | POA: Insufficient documentation

## 2020-09-12 DIAGNOSIS — G8929 Other chronic pain: Secondary | ICD-10-CM | POA: Diagnosis not present

## 2020-09-12 DIAGNOSIS — Z96649 Presence of unspecified artificial hip joint: Secondary | ICD-10-CM | POA: Insufficient documentation

## 2020-09-12 DIAGNOSIS — R609 Edema, unspecified: Secondary | ICD-10-CM | POA: Diagnosis not present

## 2020-09-12 HISTORY — DX: Acute embolism and thrombosis of unspecified deep veins of unspecified lower extremity: I82.409

## 2020-09-12 LAB — CBC
HCT: 40.9 % (ref 36.0–46.0)
Hemoglobin: 13.1 g/dL (ref 12.0–15.0)
MCH: 29.1 pg (ref 26.0–34.0)
MCHC: 32 g/dL (ref 30.0–36.0)
MCV: 90.9 fL (ref 80.0–100.0)
Platelets: 272 10*3/uL (ref 150–400)
RBC: 4.5 MIL/uL (ref 3.87–5.11)
RDW: 12.9 % (ref 11.5–15.5)
WBC: 7.6 10*3/uL (ref 4.0–10.5)
nRBC: 0 % (ref 0.0–0.2)

## 2020-09-12 LAB — BASIC METABOLIC PANEL
Anion gap: 10 (ref 5–15)
BUN: 20 mg/dL (ref 8–23)
CO2: 23 mmol/L (ref 22–32)
Calcium: 9 mg/dL (ref 8.9–10.3)
Chloride: 106 mmol/L (ref 98–111)
Creatinine, Ser: 1.01 mg/dL — ABNORMAL HIGH (ref 0.44–1.00)
GFR, Estimated: 60 mL/min — ABNORMAL LOW (ref >60)
Glucose, Bld: 88 mg/dL (ref 70–99)
Potassium: 3.6 mmol/L (ref 3.5–5.1)
Sodium: 139 mmol/L (ref 135–145)

## 2020-09-12 NOTE — ED Triage Notes (Addendum)
PT sent here from pain clinic for pain and swelling to L foot.  Pain radiates from L ankle to L knee.  Hx of blood clots to L leg.  L foot warm and dry.  Pulses present.

## 2020-09-12 NOTE — ED Notes (Signed)
Unable to locate discharge papers

## 2020-09-12 NOTE — Discharge Instructions (Signed)
Follow-up with your regular doctors.  If you have worsening leg pain, swelling, numbness, weakness, return to ER for reassessment.

## 2020-09-12 NOTE — Progress Notes (Signed)
Lower extremity venous has been completed.   Preliminary results in CV Proc.   Blanch Media 09/12/2020 4:42 PM

## 2020-09-12 NOTE — ED Provider Notes (Signed)
[facility]   CSN: [id number] Arrival date & time: 09/12/20  1533     History Chief Complaint  Patient presents with  . Foot Pain    [name] is a 70 y.o. female.  Presented to ER with concern for leg pain.  Past medical history bilateral hip pain, post total left hip replacement, multiple revision surgeries.  Her chronic pain is worse in her left hip, left leg.  Is followed closely by pain management.  She states that she chronically has some swelling in her left leg though this seems to be somewhat worse over the last few weeks.  Pain is currently moderate to severe, worse with movement, radiates down left leg to left lower leg.  No new numbness.  Has a history of DVT, states she no longer takes any anticoagulation.  HPI     Past Medical History:  Diagnosis Date  . Anemia   . Cellulitis 07/2018   left lower extremity  . Chronic kidney disease    surgery to fix  . Chronic pain   . DVT (deep venous thrombosis) (HCC)   . Fibromyalgia   . Lyme disease   . Thyroid disease     Patient Active Problem List   Diagnosis Date Noted  . Lower leg DVT (deep venous thromboembolism), acute, left (HCC) 07/28/2018  . Cellulitis of left lower extremity   . Leg edema, left 07/26/2018  . Anxiety 08/12/2017  . Chronic low back pain without sciatica 08/12/2017  . Status post revision of total hip 08/12/2017  . Multiple allergies 01/26/2017  . 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 Stricture and stenosis of esophagus 11/30/2013  . Throat pain 11/30/2013  . Diverticulosis of colon without hemorrhage 10/29/2013  . 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 Allergic rhinitis 03/26/2011  . Attention deficit hyperactivity disorder (ADHD) 03/26/2011  . Acquired hypothyroidism 03/26/2011  . Depression 03/26/2011  . Fibromyalgia 03/26/2011  . History of deep venous thrombosis 03/26/2011  . History of migraine  03/26/2011  . Other mechanical complication of prosthetic joint implant 03/26/2011  . Gastroesophageal reflux disease 03/26/2011  . Zenker's diverticulum 03/26/2011    Past Surgical History:  Procedure Laterality Date  . APPENDECTOMY  1974  . Avascular necrosis of the hip     multiple operations starting in 2005.  bilateral hips  . Back fusion  1999  . CERVICAL FUSION  [id number]  . EYE SURGERY     eye muscle  . KIDNEY SURGERY  1971   kidney reconstruction  . LAMINECTOMY  1985  . TOTAL HIP ARTHROPLASTY       OB History   No obstetric history on file.     Family History  Problem Relation Age of Onset  . Cancer Sister   . Colon cancer Neg Hx   . Stomach cancer Neg Hx   . Esophageal cancer Neg Hx     Social History   Tobacco Use  . Smoking status: Never Smoker  . Smokeless tobacco: Never Used  Vaping Use  . Vaping Use: Never used  Substance Use Topics  . Alcohol use: Yes    Comment: occasional  . Drug use: No    Home Medications Prior to Admission medications   Medication Sig Start Date End Date Taking? Authorizing Provider  benzonatate (TESSALON) 100 MG capsule Take 1 capsule (100 mg total) by mouth every 8 (eight) hours. Patient taking differently: Take 100 mg by mouth every 8 (eight)  hours as needed for cough.  04/21/14   Beverely Risen, MD  BuPROPion HCl ER, XL, 450 MG TB24 Take 450 mg by mouth daily.    [provider]  Cholecalciferol (VITAMIN D PO) Take 1 capsule by mouth every other day.    [provider]  cyclobenzaprine (FLEXERIL) 5 MG tablet Take 5 mg by mouth See admin instructions. Take 1 tablet daily and as needed for muscle spasms 10/16/13   [provider]  dexmethylphenidate (FOCALIN XR) 20 MG 24 hr capsule Take 20 mg by mouth 2 (two) times daily. 10/24/13   Charm Rings, MD  dexmethylphenidate (FOCALIN) 10 MG tablet Take 10 mg by mouth daily as needed (if second dose of 20mg  capsule isn't taken).  10/24/13   12/24/13, MD   diclofenac (VOLTAREN) 50 MG EC tablet Take 100 mg by mouth every morning.     [provider]  escitalopram (LEXAPRO) 20 MG tablet Take 40 mg by mouth daily.    [provider]  HYDROcodone-acetaminophen (NORCO/VICODIN) 5-325 MG tablet Take 1 tablet by mouth every 8 (eight) hours as needed for moderate pain. 07/28/18   Dhungel, Nishant, MD  ketoconazole (NIZORAL) 2 % shampoo Apply 1 application topically 2 (two) times a week. Patient not taking: Reported on 07/26/2018 10/10/17   10/12/17, MD  methocarbamol (ROBAXIN) 500 MG tablet Take 500 mg by mouth every morning.     [provider]  Rivaroxaban 15 & 20 MG TBPK Take as directed on package: Start with one 15mg  tablet by mouth twice a day with food. On Day 22, switch to one 20mg  tablet once a day with food. 07/28/18   Dhungel, , MD  thyroid (NATURE-THROID) 65 MG tablet Take 65 mg by mouth daily.    [provider]  topiramate (TOPAMAX) 100 MG tablet Take 100 mg by mouth every morning.     [provider]    Allergies    Amoxicillin-pot clavulanate, Clarithromycin, Fentanyl, Levofloxacin, Lorazepam, Doxycycline, Penicillins, and Sulfa antibiotics  Review of Systems   Review of Systems  Constitutional: Negative for chills and fever.  HENT: Negative for ear pain and sore throat.   Eyes: Negative for pain and visual disturbance.  Respiratory: Negative for cough and shortness of breath.   Cardiovascular: Negative for chest pain and palpitations.  Gastrointestinal: Negative for abdominal pain and vomiting.  Genitourinary: Negative for dysuria and hematuria.  Musculoskeletal: Positive for arthralgias. Negative for back pain.  Skin: Negative for color change and rash.  Neurological: Negative for seizures and syncope.  All other systems reviewed and are negative.   Physical Exam Updated Vital Signs BP 123/87   Pulse 79   Temp 98.6 F (37 C) (Oral)   Resp 20   Ht 5\' 2"  (1.575 m)    Wt 59.9 kg   SpO2 100%   BMI 24.14 kg/m   Physical Exam Vitals and nursing note reviewed.  Constitutional:      General: She is not in acute distress.    Appearance: She is well-developed and well-nourished.  HENT:     Head: Normocephalic and atraumatic.  Eyes:     Conjunctiva/sclera: Conjunctivae normal.  Cardiovascular:     Rate and Rhythm: Normal rate and regular rhythm.     Heart sounds: No murmur heard.   Pulmonary:     Effort: Pulmonary effort is normal. No respiratory distress.     Breath sounds: Normal breath sounds.  Abdominal:     Palpations:  Abdomen is soft.     Tenderness: There is no abdominal tenderness.  Musculoskeletal:        General: No edema.     Cervical back: Neck supple.     Comments: LLE: Mild swelling throughout the extremity, no significant erythema noted, leg is warm, foot is warm, normal DP and PT pulses, distal motor and sensation is intact  Skin:    General: Skin is warm and dry.  Neurological:     Mental Status: She is alert.  Psychiatric:        Mood and Affect: Mood and affect normal.     ED Results / Procedures / Treatments   Labs (all labs ordered are listed, but only abnormal results are displayed) Labs Reviewed  BASIC METABOLIC PANEL - Abnormal; Notable for the following components:      Result Value   Creatinine, Ser 1.01 (*)    GFR, Estimated 60 (*)    All other components within normal limits  CBC    EKG None  Radiology VAS [place] LOWER EXTREMITY VENOUS (DVT) ([facility] 7a-7p)  Result Date: 09/12/2020  Lower Venous DVT Study Indications: Swelling, and Edema.  Comparison Study: 07/26/18 previous Performing Technologist: Blanch Media RVS  Examination Guidelines: A complete evaluation includes B-mode imaging, spectral Doppler, color Doppler, and power Doppler as needed of all accessible portions of each vessel. Bilateral testing is considered an integral part of a complete examination. Limited examinations for reoccurring  indications may be performed as noted. The reflux portion of the exam is performed with the patient in reverse Trendelenburg.  +-----+---------------+---------+-----------+----------+--------------+ RIGHTCompressibilityPhasicitySpontaneityPropertiesThrombus Aging +-----+---------------+---------+-----------+----------+--------------+ CFV  Full           Yes      Yes                                 +-----+---------------+---------+-----------+----------+--------------+   +---------+---------------+---------+-----------+----------+--------------+ LEFT     CompressibilityPhasicitySpontaneityPropertiesThrombus Aging +---------+---------------+---------+-----------+----------+--------------+ CFV      Full           Yes      Yes                                 +---------+---------------+---------+-----------+----------+--------------+ SFJ      Full                                                        +---------+---------------+---------+-----------+----------+--------------+ FV Prox  Full                                                        +---------+---------------+---------+-----------+----------+--------------+ FV Mid   Full                                                        +---------+---------------+---------+-----------+----------+--------------+ FV DistalFull                                                        +---------+---------------+---------+-----------+----------+--------------+  PFV      Full                                                        +---------+---------------+---------+-----------+----------+--------------+ POP      Full           Yes      Yes                                 +---------+---------------+---------+-----------+----------+--------------+ PTV      Full                                                        +---------+---------------+---------+-----------+----------+--------------+ PERO     Full                                                         +---------+---------------+---------+-----------+----------+--------------+     Summary: RIGHT: - No evidence of common femoral vein obstruction.  LEFT: - There is no evidence of deep vein thrombosis in the lower extremity.  - No cystic structure found in the popliteal fossa.  *See table(s) above for measurements and observations.    Preliminary     Procedures Procedures   Medications Ordered in ED Medications - No data to display  ED Course  I have reviewed the triage vital signs and the nursing notes.  Pertinent labs & imaging results that were available during my care of the patient were reviewed by me and considered in my medical decision making (see chart for details).    MDM Rules/Calculators/A&P                         70 year old lady with complex left hip history, chronic left leg pain presenting to ER with concern for worsening pain and swelling.  On exam, patient is well-appearing, neurovascularly intact.  DVT study negative.  Suspect her symptoms today are related to her chronic pain, decreased mobility.  Recommended follow-up with her regular doctors, discharged home.  After the discussed management above, the patient was determined to be safe for discharge.  The patient was in agreement with this plan and all questions regarding their care were answered.  ED return precautions were discussed and the patient will return to the ED with any significant worsening of condition.   Final Clinical Impression(s) / ED Diagnoses Final diagnoses:  Left leg pain    Rx / DC Orders ED Discharge Orders    None       Milagros Loll, MD 09/12/20 2305

## 2020-10-18 ENCOUNTER — Encounter (HOSPITAL_COMMUNITY): Payer: Self-pay

## 2020-10-18 ENCOUNTER — Other Ambulatory Visit: Payer: Self-pay

## 2020-10-18 ENCOUNTER — Ambulatory Visit (HOSPITAL_COMMUNITY)
Admission: EM | Admit: 2020-10-18 | Discharge: 2020-10-18 | Disposition: A | Discharge status: Home / Self Care | Payer: Medicare Other | Attending: Urgent Care | Admitting: Urgent Care

## 2020-10-18 DIAGNOSIS — G8929 Other chronic pain: Secondary | ICD-10-CM | POA: Diagnosis not present

## 2020-10-18 DIAGNOSIS — Z760 Encounter for issue of repeat prescription: Secondary | ICD-10-CM

## 2020-10-18 DIAGNOSIS — R519 Headache, unspecified: Secondary | ICD-10-CM | POA: Diagnosis not present

## 2020-10-18 DIAGNOSIS — E079 Disorder of thyroid, unspecified: Secondary | ICD-10-CM

## 2020-10-18 MED ORDER — THYROID 65 MG PO TABS: 65.0000 mg | ORAL_TABLET | Freq: Every day | ORAL | 0 refills | Status: DC

## 2020-10-18 MED ORDER — TOPIRAMATE 100 MG PO TABS: 100.0000 mg | ORAL_TABLET | ORAL | 0 refills | Status: DC

## 2020-10-18 NOTE — ED Triage Notes (Signed)
Pt is here requesting for a refill on Topamax and Synthroid. She states she has an appointment with her pain management Provider in April and needs the medications refilled to get through the next few weeks.

## 2020-10-18 NOTE — ED Provider Notes (Signed)
[facility]   MRN: [id number] DOB: 1950/12/12  Subjective:   [name] is a 70 y.o. female presenting for medication refill for her thyroid disease, chronic headaches.  Patient does well and does not need any medication changes but unfortunately she did run out.  Takes thyroid 65 mg and Topamax 100 mg.  Needs them sent to different pharmacies.  States that she has close follow-up including an appointment coming up with her regular doctor.  No current facility-administered medications for this encounter.  Current Outpatient Medications:  .  benzonatate (TESSALON) 100 MG capsule, Take 1 capsule (100 mg total) by mouth every 8 (eight) hours. (Patient taking differently: Take 100 mg by mouth every 8 (eight) hours as needed for cough. ), Disp: 21 capsule, Rfl: 0 .  BuPROPion HCl ER, XL, 450 MG TB24, Take 450 mg by mouth daily., Disp: , Rfl:  .  Cholecalciferol (VITAMIN D PO), Take 1 capsule by mouth every other day., Disp: , Rfl:  .  cyclobenzaprine (FLEXERIL) 5 MG tablet, Take 5 mg by mouth See admin instructions. Take 1 tablet daily and as needed for muscle spasms, Disp: , Rfl:  .  dexmethylphenidate (FOCALIN XR) 20 MG 24 hr capsule, Take 20 mg by mouth 2 (two) times daily., Disp: , Rfl:  .  dexmethylphenidate (FOCALIN) 10 MG tablet, Take 10 mg by mouth daily as needed (if second dose of 20mg  capsule isn't taken). , Disp: , Rfl:  .  diclofenac (VOLTAREN) 50 MG EC tablet, Take 100 mg by mouth every morning. , Disp: , Rfl:  .  escitalopram (LEXAPRO) 20 MG tablet, Take 40 mg by mouth daily., Disp: , Rfl:  .  HYDROcodone-acetaminophen (NORCO/VICODIN) 5-325 MG tablet, Take 1 tablet by mouth every 8 (eight) hours as needed for moderate pain., Disp: 12 tablet, Rfl: 0 .  ketoconazole (NIZORAL) 2 % shampoo, Apply 1 application topically 2 (two) times a week. (Patient not taking: Reported on 07/26/2018), Disp: 120 mL, Rfl: 0 .  methocarbamol (ROBAXIN) 500 MG tablet, Take 500 mg by mouth  every morning. , Disp: , Rfl:  .  Rivaroxaban 15 & 20 MG TBPK, Take as directed on package: Start with one 15mg  tablet by mouth twice a day with food. On Day 22, switch to one 20mg  tablet once a day with food., Disp: 51 each, Rfl: 0 .  thyroid (NATURE-THROID) 65 MG tablet, Take 65 mg by mouth daily., Disp: , Rfl:  .  topiramate (TOPAMAX) 100 MG tablet, Take 100 mg by mouth every morning. , Disp: , Rfl:    Allergies  Allergen Reactions  . Amoxicillin-Pot Clavulanate Nausea And Vomiting and Other (See Comments)  . Clarithromycin Other (See Comments)    DOES NOT REMEMBER REACTION DOES NOT REMEMBER REACTION   . Fentanyl Nausea And Vomiting and Other (See Comments)    FENTANYL PATCH FENTANYL PATCH FENTANYL PATCH   . Levofloxacin Swelling    Face,eye swelling Face,eye swelling   . Lorazepam Other (See Comments)    DO NOT PUT ANY ATIVAN ON PATIENT'S IV POST SURGERY! DO NOT PUT ANY ATIVAN ON PATIENT'S IV POST SURGERY!   . Doxycycline Nausea And Vomiting  . Penicillins Hives  . Sulfa Antibiotics     Per pt: unknown    Past Medical History:  Diagnosis Date  . Anemia   . Cellulitis 07/2018   left lower extremity  . Chronic kidney disease    surgery to fix  . Chronic pain   .  DVT (deep venous thrombosis) (HCC)   . Fibromyalgia   . Lyme disease   . Thyroid disease      Past Surgical History:  Procedure Laterality Date  . APPENDECTOMY  1974  . Avascular necrosis of the hip     multiple operations starting in 2005.  bilateral hips  . Back fusion  1999  . CERVICAL FUSION  [id number]  . EYE SURGERY     eye muscle  . KIDNEY SURGERY  1971   kidney reconstruction  . LAMINECTOMY  1985  . TOTAL HIP ARTHROPLASTY      Family History  Problem Relation Age of Onset  . Cancer Sister   . Colon cancer Neg Hx   . Stomach cancer Neg Hx   . Esophageal cancer Neg Hx     Social History   Tobacco Use  . Smoking status: Never Smoker  . Smokeless tobacco: Never Used  Vaping Use  .  Vaping Use: Never used  Substance Use Topics  . Alcohol use: Yes    Comment: occasional  . Drug use: No    ROS   Objective:   Vitals: BP (!) 170/96 (BP Location: Right Arm)   Pulse 76   Temp 99.5 F (37.5 C) (Oral)   Resp 16   SpO2 96%   BP Readings from Last 3 Encounters:  10/18/20 (!) 170/96  09/12/20 133/86  07/28/18 (!) 109/53   BP recheck was 147/78 on left arm.   Physical Exam Constitutional:      General: She is not in acute distress.    Appearance: Normal appearance. She is well-developed. She is not ill-appearing, toxic-appearing or diaphoretic.  HENT:     Head: Normocephalic and atraumatic.     Nose: Nose normal.     Mouth/Throat:     Mouth: Mucous membranes are moist.     Pharynx: Oropharynx is clear.  Eyes:     General: No scleral icterus.       Right eye: No discharge.        Left eye: No discharge.     Extraocular Movements: Extraocular movements intact.     Conjunctiva/sclera: Conjunctivae normal.     Pupils: Pupils are equal, round, and reactive to light.  Cardiovascular:     Rate and Rhythm: Normal rate.  Pulmonary:     Effort: Pulmonary effort is normal.  Skin:    General: Skin is warm and dry.  Neurological:     General: No focal deficit present.     Mental Status: She is alert and oriented to person, place, and time.  Psychiatric:        Mood and Affect: Mood normal.        Behavior: Behavior normal.        Thought Content: Thought content normal.        Judgment: Judgment normal.      Assessment and Plan :   PDMP not reviewed this encounter.  1. Thyroid disease   2. Medication refill   3. Chronic nonintractable headache, unspecified headache type     Patient is stable, blood pressure recheck was a lot better.  Refilled her medications with the current dosing that she has.  Provided a 30-day refill. Counseled patient on potential for adverse effects with medications prescribed/recommended today, ER and return-to-clinic  precautions discussed, patient verbalized understanding.    Wallis Bamberg, [place] 10/18/20 1733

## 2020-10-25 ENCOUNTER — Telehealth (HOSPITAL_COMMUNITY): Payer: Self-pay | Admitting: Emergency Medicine

## 2020-10-25 MED ORDER — THYROID 60 MG PO TABS: 60.0000 mg | ORAL_TABLET | Freq: Every day | ORAL | 0 refills | Status: DC

## 2020-10-25 NOTE — ED Notes (Signed)
Pt presented today because the [facility] is out of medication. Pt wanted med faxed to [facility] . Wendee Beavers PA  Sent RX to requested store.

## 2020-10-25 NOTE — Telephone Encounter (Signed)
Patient presents to urgent care with concern that her NP Thyroid is not available at the [facility] where the prescription was sent when she was seen here on 10/18/2020.  She requests that the prescription be sent to a different pharmacy.  She is established at [facility].  Prescription transmitted to Sheliah Plane.  Patient reports she is in the process of establishing a primary care provider.

## 2020-10-27 ENCOUNTER — Ambulatory Visit: Payer: Self-pay | Admitting: Family

## 2020-11-04 ENCOUNTER — Ambulatory Visit: Payer: Medicare Other | Admitting: Family

## 2020-11-07 ENCOUNTER — Ambulatory Visit: Payer: Medicare Other | Admitting: Family

## 2020-11-10 ENCOUNTER — Encounter: Payer: Self-pay | Admitting: Family

## 2020-11-10 ENCOUNTER — Other Ambulatory Visit: Payer: Self-pay

## 2020-11-10 ENCOUNTER — Ambulatory Visit (INDEPENDENT_AMBULATORY_CARE_PROVIDER_SITE_OTHER): Payer: Medicare Other | Admitting: Family

## 2020-11-10 VITALS — BP 120/60 | HR 80 | Temp 97.7°F | Resp 16 | Ht 62.0 in | Wt 127.3 lb

## 2020-11-10 DIAGNOSIS — M797 Fibromyalgia: Secondary | ICD-10-CM

## 2020-11-10 DIAGNOSIS — Z96649 Presence of unspecified artificial hip joint: Secondary | ICD-10-CM

## 2020-11-10 DIAGNOSIS — Z780 Asymptomatic menopausal state: Secondary | ICD-10-CM

## 2020-11-10 DIAGNOSIS — Z23 Encounter for immunization: Secondary | ICD-10-CM | POA: Diagnosis not present

## 2020-11-10 DIAGNOSIS — G8929 Other chronic pain: Secondary | ICD-10-CM

## 2020-11-10 DIAGNOSIS — L84 Corns and callosities: Secondary | ICD-10-CM | POA: Diagnosis not present

## 2020-11-10 DIAGNOSIS — F909 Attention-deficit hyperactivity disorder, unspecified type: Secondary | ICD-10-CM

## 2020-11-10 DIAGNOSIS — K219 Gastro-esophageal reflux disease without esophagitis: Secondary | ICD-10-CM

## 2020-11-10 DIAGNOSIS — M545 Low back pain, unspecified: Secondary | ICD-10-CM | POA: Diagnosis not present

## 2020-11-10 DIAGNOSIS — E039 Hypothyroidism, unspecified: Secondary | ICD-10-CM | POA: Diagnosis not present

## 2020-11-10 DIAGNOSIS — G43109 Migraine with aura, not intractable, without status migrainosus: Secondary | ICD-10-CM

## 2020-11-10 DIAGNOSIS — N952 Postmenopausal atrophic vaginitis: Secondary | ICD-10-CM

## 2020-11-10 DIAGNOSIS — F331 Major depressive disorder, recurrent, moderate: Secondary | ICD-10-CM

## 2020-11-10 DIAGNOSIS — Z0000 Encounter for general adult medical examination without abnormal findings: Secondary | ICD-10-CM

## 2020-11-10 MED ORDER — THYROID 60 MG PO TABS: 60.0000 mg | ORAL_TABLET | Freq: Every day | ORAL | 3 refills | Status: DC

## 2020-11-10 MED ORDER — TETANUS-DIPHTH-ACELL PERTUSSIS 5-2.5-18.5 LF-MCG/0.5 IM SUSP: 0.5000 mL | Freq: Once | INTRAMUSCULAR | 0 refills | Status: AC

## 2020-11-10 MED ORDER — TOPIRAMATE 100 MG PO TABS: 100.0000 mg | ORAL_TABLET | ORAL | 3 refills | Status: DC

## 2020-11-10 MED ORDER — BUPROPION HCL ER (XL) 450 MG PO TB24: 450.0000 mg | ORAL_TABLET | Freq: Every day | ORAL | 3 refills | Status: DC

## 2020-11-10 MED ORDER — METHOCARBAMOL 500 MG PO TABS
500.0000 mg | ORAL_TABLET | ORAL | 3 refills | Status: DC | PRN
Start: 2020-11-10 — End: 2021-05-21

## 2020-11-10 MED ORDER — DICLOFENAC SODIUM ER 100 MG PO TB24: 100.0000 mg | ORAL_TABLET | Freq: Every day | ORAL | 3 refills | Status: DC

## 2020-11-10 MED ORDER — DEXMETHYLPHENIDATE HCL 10 MG PO TABS: 10.0000 mg | ORAL_TABLET | Freq: Every day | ORAL | 0 refills | Status: DC | PRN

## 2020-11-10 MED ORDER — ESCITALOPRAM OXALATE 20 MG PO TABS: 40.0000 mg | ORAL_TABLET | Freq: Every day | ORAL | 3 refills | Status: DC

## 2020-11-10 NOTE — Progress Notes (Signed)
Provider: Marlowe Sax FNP-C   Rhylee Pucillo, Nelda Bucks, NP  Patient Care Team: Ramya Vanbergen, Nelda Bucks, NP as PCP - General (Family Medicine)  Extended Emergency Contact Information Primary Emergency Contact: [place] of [place] Mobile Phone: [phone] Relation: Son Secondary Emergency Contact: Council Mechanic III Address: [place] [address] Johnnette Litter of [place] Phone: [phone] Mobile Phone: [phone] Relation: Spouse  Code Status:  Full Code  Goals of care: Advanced Directive information Advanced Directives 11/10/2020  Does Patient Have a Medical Advance Directive? No  Does patient want to make changes to medical advance directive? No - Patient declined  Would patient like information on creating a medical advance directive? -     Chief Complaint  Patient presents with  . Establish Care    New Patient     HPI:  Pt is a 70 y.o. 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  She request referral to a different gastroenterologist for evaluation of back of the throat closing has had stretching in the past but does not want to see same GI specialist that she has seen in the past.States has parasite that comes to the back of her  throat and causes pain sometimes able to spit them.Also showed me pictures of several of her stool on her phone that has parasites.States no one believes her.she first got parasites after she travelled to [facility] " they say it's in my head not really".tells me parasites have left several marks on her skin on upper  arms that are itchy and also caused some lump on the back of her head. States as had no treatment thinks no one in [place] can treat her.Discussed providing stool specimen but prefers to follow up with gastroenterology for colonoscopy.States stool specimen was send to a veterinarian who confirmed presence of parasites.Her previous Provider  Who ordered stool to be send to veterinarian and got the report is no longer with the practice unclear whether she can get records.    Has had multiple surgries on her lumbar spine and left hip.has chronic pain.follows up with pain management clinic also for Fibromyalgia.she follows up with [facility] at [facility] on [address].currently on Buprenorphine 20 mcg /hr patch weekly.Also on Diclofenac 100 mg tablet and muscle relaxant.    States depression symptoms stable on bupropion and lexapro. No S/I  Migraine headache with Aura sometimes.on Topamax.denies any nausea or vomiting.Sometimes sensitivity to bright light.   She does not smoke,use alcohol or any illicit drugs. States adheres to a very healthy eating. She is separated with husband since 21.Does own activity of daily living by herself.   She is due for Pneumococcal and Tdap vaccine. Also due for mammogram and bone density.But recalls had mammogram early this year though seems to be not sure.will wait for records.    Past Medical History:  Diagnosis Date  . Anemia   . Cellulitis 07/2018   left lower extremity  . Chronic kidney disease    surgery to fix  . Chronic pain   . DVT (deep venous thrombosis) ([place])   . Fibromyalgia   . Lyme disease   . Thyroid disease    Past Surgical History:  Procedure Laterality Date  . APPENDECTOMY  1974  . Avascular necrosis of the hip     multiple operations starting in 2005.  bilateral hips  . Back fusion  1999  . CERVICAL FUSION  [id number]  . EYE SURGERY     eye muscle  . KIDNEY SURGERY  1971   kidney reconstruction  .  LAMINECTOMY  1985  . TOTAL HIP ARTHROPLASTY      Allergies  Allergen Reactions  . Amoxicillin-Pot Clavulanate Nausea And Vomiting and Other (See Comments)  . Clarithromycin Other (See Comments)    DOES NOT REMEMBER REACTION DOES NOT REMEMBER REACTION   . Fentanyl Nausea And Vomiting and Other (See Comments)    FENTANYL PATCH FENTANYL PATCH FENTANYL PATCH   . Levofloxacin Swelling    Face,eye swelling Face,eye swelling   . Lorazepam Other (See Comments)    DO NOT PUT ANY ATIVAN ON PATIENT'S IV POST SURGERY! DO NOT PUT ANY ATIVAN ON PATIENT'S IV POST SURGERY!   . Doxycycline Nausea And Vomiting  . Penicillins Hives  . Sulfa Antibiotics Other (See Comments)    Per pt: unknown    Allergies as of 11/10/2020      Reactions   Amoxicillin-pot Clavulanate Nausea And Vomiting, Other (See Comments)   Clarithromycin Other (See Comments)   DOES NOT REMEMBER REACTION DOES NOT REMEMBER REACTION   Fentanyl Nausea And Vomiting, Other (See Comments)   FENTANYL PATCH FENTANYL PATCH FENTANYL PATCH   Levofloxacin Swelling   Face,eye swelling Face,eye swelling   Lorazepam Other (See Comments)   DO NOT PUT ANY ATIVAN ON PATIENT'S IV POST SURGERY! DO NOT PUT ANY ATIVAN ON PATIENT'S IV POST SURGERY!   Doxycycline Nausea And Vomiting   Penicillins Hives   Sulfa Antibiotics Other (See Comments)   Per pt: unknown      Medication List       Accurate as of November 10, 2020  2:09 PM. If you have any questions, ask your nurse or doctor.        STOP taking these medications   benzonatate 100 MG capsule Commonly known as: TESSALON Stopped by: Sandrea Hughs, NP   HYDROcodone-acetaminophen 5-325 MG tablet Commonly known as: NORCO/VICODIN Stopped by: Sandrea Hughs, NP     TAKE these medications   aspirin 81 MG chewable tablet Chew 81 mg by mouth as needed.   buPROPion HCl ER (XL) 450 MG Tb24 Take 450 mg by mouth daily.   cyclobenzaprine 5 MG tablet Commonly known as:  FLEXERIL Take 5 mg by mouth See admin instructions. Take 1 tablet daily and as needed for muscle spasms   dexmethylphenidate 20 MG 24 hr capsule Commonly known as: FOCALIN XR Take 20 mg by mouth daily.   dexmethylphenidate 10 MG tablet Commonly known as: FOCALIN Take 10 mg by mouth daily as needed (if second dose of 9m capsule isn't taken).   Diclofenac Sodium CR 100 MG 24 hr tablet Take 100 mg by mouth daily. With food What changed: Another medication with the same name was removed. Continue taking this medication, and follow the directions you see here. Changed by: DSandrea Hughs NP   escitalopram 20 MG tablet Commonly known as: LEXAPRO Take 40 mg by mouth daily.   ketoconazole 2 % shampoo Commonly known as: NIZORAL Apply 1 application topically 2 (two) times a week.   methocarbamol 500 MG tablet Commonly known as: ROBAXIN Take 500 mg by mouth as needed for muscle spasms. 1-2 tablets PRN   Rivaroxaban  Stater Pack (15 mg and 20 mg) Commonly known as: XARELTO STARTER PACK Take as directed on package: Start with one 58m tablet by mouth twice a day with food. On Day 22, switch to one 249mtablet once a day with food.   thyroid 60 MG tablet Commonly known as: NP Thyroid Take 1 tablet (60 mg total) by mouth daily before breakfast.   topiramate 100 MG tablet Commonly known as: TOPAMAX Take 1 tablet (100 mg total) by mouth every morning.   VITAMIN D PO Take 1 capsule by mouth as needed.       Review of Systems  Constitutional: Negative for appetite change, chills, fatigue, fever and unexpected weight change.  HENT: Negative for congestion, dental problem, ear discharge, ear pain, facial swelling, hearing loss, nosebleeds, postnasal drip, rhinorrhea, sinus pressure, sinus pain, sneezing, sore throat, tinnitus and trouble swallowing.        Throat pain due to parasites  Esophagus stricture and stenosis   Eyes: Negative for pain, discharge, redness, itching and visual  disturbance.  Respiratory: Negative for cough, chest tightness, shortness of breath and wheezing.   Cardiovascular: Negative for chest pain, palpitations and leg swelling.  Gastrointestinal: Positive for abdominal pain. Negative for abdominal distention, blood in stool, constipation, diarrhea, nausea and vomiting.       Ongoing issues with intestinal parasites with abdominal pain     Endocrine: Negative for cold intolerance, heat intolerance, polydipsia, polyphagia and polyuria.  Genitourinary: Negative for difficulty urinating, dysuria, flank pain, frequency and urgency.  Musculoskeletal: Positive for arthralgias, back pain, gait problem and myalgias. Negative for joint swelling, neck pain and neck stiffness.  Skin: Negative for color change, pallor, rash and wound.  Neurological: Positive for headaches. Negative for dizziness, syncope, speech difficulty, weakness, light-headedness and numbness.       Migraine with aura   Hematological: Does not bruise/bleed easily.  Psychiatric/Behavioral: Positive for sleep disturbance. Negative for agitation, behavioral problems, confusion, hallucinations, self-injury and suicidal ideas. The patient is not nervous/anxious.        Depression symptoms stable     Immunization History  Administered Date(s) Administered  . Influenza, High Dose Seasonal PF 06/09/2018, 09/07/2019   Pertinent  Health Maintenance Due  Topic Date Due  . MAMMOGRAM  09/08/2013  . DEXA SCAN  Never done  . PNA vac Low Risk Adult (1 of 2 - PCV13) Never done  . INFLUENZA VACCINE  02/16/2021  . COLONOSCOPY (Pts [id number] coverage will need to be confirmed)  12/01/2021   Fall Risk  11/10/2020  Falls in the past year? 0  Number falls in past yr: 0  Injury with Fall? 0   Functional Status Survey:    Vitals:   11/10/20 1333  BP: 120/60  Pulse: 80  Resp: 16  Temp: 97.7 F (36.5 C)  SpO2: 98%  Weight: 127 lb 4.8 oz (57.7 kg)  Height: _0  (1.575 m)   Body mass  index is 23.28 kg/m. Physical Exam Vitals reviewed.  Constitutional:      General: She is not in acute distress.    Appearance: Normal appearance. She is normal weight. She is not ill-appearing or diaphoretic.  HENT:     Head: Normocephalic.     Right Ear: Tympanic membrane, ear canal and external ear normal. There is no impacted cerumen.     Left Ear: Tympanic membrane, ear canal and external ear normal. There is no impacted cerumen.     Nose: Nose normal. No congestion or rhinorrhea.  Mouth/Throat:     Mouth: Mucous membranes are moist.     Pharynx: Oropharynx is clear. No oropharyngeal exudate or posterior oropharyngeal erythema.  Eyes:     General: No scleral icterus.       Right eye: No discharge.        Left eye: No discharge.     Extraocular Movements: Extraocular movements intact.     Conjunctiva/sclera: Conjunctivae normal.     Pupils: Pupils are equal, round, and reactive to light.  Neck:     Vascular: No carotid bruit.  Cardiovascular:     Rate and Rhythm: Normal rate and regular rhythm.     Pulses: Normal pulses.     Heart sounds: Normal heart sounds. No murmur heard. No friction rub. No gallop.   Pulmonary:     Effort: Pulmonary effort is normal. No respiratory distress.     Breath sounds: Normal breath sounds. No wheezing, rhonchi or rales.  Chest:     Chest wall: No tenderness.  Abdominal:     General: Bowel sounds are normal. There is no distension.     Palpations: Abdomen is soft. There is no mass.     Tenderness: There is no abdominal tenderness. There is no right CVA tenderness, left CVA tenderness, guarding or rebound.  Musculoskeletal:        General: No swelling or tenderness.     Cervical back: Normal range of motion. No rigidity or tenderness.     Right lower leg: No edema.     Left lower leg: No edema.     Comments: Unsteady gait walks with a three point cane verse to the right with left foot/ankle eversion.   Lymphadenopathy:     Cervical: No  cervical adenopathy.  Skin:    General: Skin is warm and dry.     Coloration: Skin is not pale.     Findings: No bruising, erythema, lesion or rash.  Neurological:     Mental Status: She is alert and oriented to person, place, and time.     Cranial Nerves: No cranial nerve deficit.     Sensory: No sensory deficit.     Motor: No weakness.     Coordination: Coordination normal.     Gait: Gait abnormal.  Psychiatric:        Mood and Affect: Mood normal.        Speech: Speech normal.        Behavior: Behavior normal.        Thought Content: Thought content is not paranoid. Thought content does not include suicidal ideation. Thought content does not include suicidal plan.        Judgment: Judgment normal.     Labs reviewed: Recent Labs    09/12/20 1716  NA 139  K 3.6  CL 106  CO2 23  GLUCOSE 88  BUN 20  CREATININE 1.01*  CALCIUM 9.0   No results for input(s): AST, ALT, ALKPHOS, BILITOT, PROT, ALBUMIN in the last 8760 hours. Recent Labs    09/12/20 1716  WBC 7.6  HGB 13.1  HCT 40.9  MCV 90.9  PLT 272   No results found for: TSH No results found for: HGBA1C No results found for: CHOL, HDL, LDLCALC, LDLDIRECT, TRIG, CHOLHDL  Significant Diagnostic Results in last 30 days:  No results found.  Assessment/Plan 1. Encounter for medical examination to establish care Up to date with immunization except due for Tdap and Pneumococcal vaccine. Medication reviewed.No latest labs for reviewed.patient counselled regarding  yearly exam, prevention of dental and periodontal disease, diet, regular sustained exercise for at least 30 minutes x 3 /week,COVID-19 hand hygiene, mask and social distancing per CDC guidelines. recommended schedule for routine labs. Fall and safety.Will obtain MMSE and Goals of care on next visit. - CBC with Differential/Platelet; Future - CMP with eGFR([facility]); Future - TSH; Future - Lipid panel; Future  2. Need for Tdap vaccination Advised to get Tdap  vaccine at her pharmacy script send today.  - Tdap ([place]) 5-2.5-18.5 LF-MCG/0.5 injection; Inject 0.5 mLs into the muscle once for 1 dose.  Dispense: 0.5 mL; Refill: 0  3. Moderate episode of recurrent major depressive disorder (HCC) Mood stable.continue on Lexapro and Bupropion.  - escitalopram (LEXAPRO) 20 MG tablet; Take 2 tablets (40 mg total) by mouth daily.  Dispense: 30 tablet; Refill: 3 - buPROPion HCl ER, XL, 450 MG TB24; Take 450 mg by mouth daily.  Dispense: 30 tablet; Refill: 3 - TSH; Future  4. Chronic right-sided low back pain without sciatica Chronic. Continue on diclofenac,felexril,Robaxin and Buprenorphine managed by pain management clinic . - Diclofenac Sodium CR 100 MG 24 hr tablet; Take 1 tablet (100 mg total) by mouth daily. With food  Dispense: 30 tablet; Refill: 3 - methocarbamol (ROBAXIN) 500 MG tablet; Take 1 tablet (500 mg total) by mouth as needed for muscle spasms. 1-2 tablets PRN  Dispense: 30 tablet; Refill: 3 - continue to follow up with pain management clinic  5. Fibromyalgia - continue to follow up with pain management clinic  6. Acquired hypothyroidism No latest TSH level for review  - continue on Thyroid as below.will return for lab work.  - thyroid (NP THYROID) 60 MG tablet; Take 1 tablet (60 mg total) by mouth daily before breakfast.  Dispense: 30 tablet; Refill: 3 - TSH; Future  7. Foot callus Thick dry skin on lateral great toes.No signs of infection. Will refer to podiatrist for evaluation.  - Ambulatory referral to Podiatry  8. Gastroesophageal reflux disease, unspecified whether esophagitis present Reports esophageal stricture and pain unable to swallow.Attributes to intestinal parasites would like Endo/colonscopy.referred to GI  - Ambulatory referral to Gastroenterology - CBC with Differential/Platelet; Future  9. Postmenopausal estrogen deficiency No recent falls or fractures. Made aware imaging Center will call for appointment - DG  Bone Density; Future  10. Vaginal atrophy No pain or discharge reported.declined cream would like to see Gyn - Ambulatory referral to Gynecology  11. Attention deficit hyperactivity disorder (ADHD), unspecified ADHD type Stable on Focalin.request refills.Contract signed today  - dexmethylphenidate (FOCALIN) 10 MG tablet; Take 1 tablet (10 mg total) by mouth daily as needed (if second dose of 76m capsule isn't taken).  Dispense: 30 tablet; Refill: 0  12. Status post revision of total hip Reason for her chronic pain.continue to follow up with Orthopedic  - continue follow up with pain management specialist   13. Migraine with aura and without status migrainosus, not intractable Topamax effective  - topiramate (TOPAMAX) 100 MG tablet; Take 1 tablet (100 mg total) by mouth every morning.  Dispense: 30 tablet; Refill: 3  14. Need for pneumococcal vaccination Afebrile. [facility] Pneumococcal 23 [facility] no reaction reported.  - Pneumococcal polysaccharide vaccine 23-valent greater than or equal to 2yo subcutaneous/IM  Family/ staff Communication: Reviewed plan of care with patient verbalized understanding.   Labs/tests ordered:  - CBC with Differential/Platelet - CMP with eGFR([facility]) - TSH - Hgb A1C - Lipid panel  Next Appointment : 6 months for medical  management of chronic issues.Fasting Labs in 1 week or sooner.  Time spent with patient 60 minutes >50% time spent counseling; reviewing available medical record; tests; labs; and developing future plan of care   Sandrea Hughs, NP

## 2020-11-11 ENCOUNTER — Telehealth: Payer: Self-pay

## 2020-11-11 DIAGNOSIS — F909 Attention-deficit hyperactivity disorder, unspecified type: Secondary | ICD-10-CM

## 2020-11-11 DIAGNOSIS — G8929 Other chronic pain: Secondary | ICD-10-CM

## 2020-11-11 DIAGNOSIS — M545 Low back pain, unspecified: Secondary | ICD-10-CM

## 2020-11-11 MED ORDER — CYCLOBENZAPRINE HCL 5 MG PO TABS: 5.0000 mg | ORAL_TABLET | Freq: Every day | ORAL | 5 refills | Status: DC | PRN

## 2020-11-11 MED ORDER — DEXMETHYLPHENIDATE HCL ER 20 MG PO CP24: 20.0000 mg | ORAL_CAPSULE | Freq: Every day | ORAL | 0 refills | Status: DC

## 2020-11-11 NOTE — Telephone Encounter (Signed)
Patient was seen yesterday and Carilyn Goodpasture was suppose to fill Focalin 20 mg and flexeril 5 mg, the pharmacy never received. RX's need to be submitted today if possible   RX's pended for the provider to review and approve if necessary

## 2020-11-11 NOTE — Telephone Encounter (Signed)
Script send.Focalin 10 mg tablet refilled during the visit.

## 2020-11-12 NOTE — Telephone Encounter (Signed)
RX's were sent as patient requested, patient aware

## 2020-11-14 ENCOUNTER — Telehealth: Payer: Self-pay | Admitting: *Deleted

## 2020-11-14 NOTE — Telephone Encounter (Signed)
Received fax from [name] for prior authorization for patient's Bupropion ER 450mg  Initiated through Key: [id number] Rx#: [employer] Awaiting Determination.

## 2020-11-17 ENCOUNTER — Other Ambulatory Visit: Payer: Medicare Other

## 2020-11-17 ENCOUNTER — Ambulatory Visit: Payer: Medicare Other | Admitting: Obstetrics and Gynecology

## 2020-11-17 ENCOUNTER — Other Ambulatory Visit: Payer: Self-pay

## 2020-11-17 DIAGNOSIS — Z0000 Encounter for general adult medical examination without abnormal findings: Secondary | ICD-10-CM

## 2020-11-17 DIAGNOSIS — K219 Gastro-esophageal reflux disease without esophagitis: Secondary | ICD-10-CM

## 2020-11-17 DIAGNOSIS — F331 Major depressive disorder, recurrent, moderate: Secondary | ICD-10-CM

## 2020-11-18 ENCOUNTER — Other Ambulatory Visit (HOSPITAL_COMMUNITY)
Admission: RE | Admit: 2020-11-18 | Discharge: 2020-11-18 | Disposition: A | Discharge status: Home / Self Care | Payer: Medicare Other | Source: Ambulatory Visit | Attending: Obstetrics and Gynecology | Admitting: Obstetrics and Gynecology

## 2020-11-18 ENCOUNTER — Ambulatory Visit (INDEPENDENT_AMBULATORY_CARE_PROVIDER_SITE_OTHER): Payer: Medicare Other | Admitting: Obstetrics and Gynecology

## 2020-11-18 ENCOUNTER — Encounter: Payer: Self-pay | Admitting: Obstetrics and Gynecology

## 2020-11-18 VITALS — BP 124/76 | HR 89 | Ht 59.5 in | Wt 125.0 lb

## 2020-11-18 DIAGNOSIS — Z124 Encounter for screening for malignant neoplasm of cervix: Secondary | ICD-10-CM

## 2020-11-18 DIAGNOSIS — R32 Unspecified urinary incontinence: Secondary | ICD-10-CM

## 2020-11-18 DIAGNOSIS — N762 Acute vulvitis: Secondary | ICD-10-CM | POA: Diagnosis not present

## 2020-11-18 LAB — COMPLETE METABOLIC PANEL WITH GFR
AG Ratio: 1.7 (calc) (ref 1.0–2.5)
ALT: 17 U/L (ref 6–29)
AST: 22 U/L (ref 10–35)
Albumin: 4.5 g/dL (ref 3.6–5.1)
Alkaline phosphatase (APISO): 87 U/L (ref 37–153)
BUN: 17 mg/dL (ref 7–25)
CO2: 20 mmol/L (ref 20–32)
Calcium: 9.8 mg/dL (ref 8.6–10.4)
Chloride: 106 mmol/L (ref 98–110)
Creat: 0.86 mg/dL (ref 0.60–0.93)
GFR, Est African American: 79 mL/min/{1.73_m2} (ref >=60)
GFR, Est Non African American: 68 mL/min/{1.73_m2} (ref >=60)
Globulin: 2.6 g/dL (calc) (ref 1.9–3.7)
Glucose, Bld: 76 mg/dL (ref 65–99)
Potassium: 4.2 mmol/L (ref 3.5–5.3)
Sodium: 139 mmol/L (ref 135–146)
Total Bilirubin: 0.6 mg/dL (ref 0.2–1.2)
Total Protein: 7.1 g/dL (ref 6.1–8.1)

## 2020-11-18 LAB — CBC WITH DIFFERENTIAL/PLATELET
Absolute Monocytes: 485 cells/uL (ref 200–950)
Basophils Absolute: 70 cells/uL (ref 0–200)
Basophils Relative: 1.4 %
Eosinophils Absolute: 320 cells/uL (ref 15–500)
Eosinophils Relative: 6.4 %
HCT: 44.2 % (ref 35.0–45.0)
Hemoglobin: 14.2 g/dL (ref 11.7–15.5)
Lymphs Abs: 1300 cells/uL (ref 850–3900)
MCH: 28.6 pg (ref 27.0–33.0)
MCHC: 32.1 g/dL (ref 32.0–36.0)
MCV: 88.9 fL (ref 80.0–100.0)
MPV: 10 fL (ref 7.5–12.5)
Monocytes Relative: 9.7 %
Neutro Abs: 2825 cells/uL (ref 1500–7800)
Neutrophils Relative %: 56.5 %
Platelets: 361 10*3/uL (ref 140–400)
RBC: 4.97 10*6/uL (ref 3.80–5.10)
RDW: 12.5 % (ref 11.0–15.0)
Total Lymphocyte: 26 %
WBC: 5 10*3/uL (ref 3.8–10.8)

## 2020-11-18 LAB — LIPID PANEL
Cholesterol: 164 mg/dL (ref <200)
HDL: 78 mg/dL (ref >=50)
LDL Cholesterol (Calc): 72 mg/dL (calc)
Non-HDL Cholesterol (Calc): 86 mg/dL (calc) (ref <130)
Total CHOL/HDL Ratio: 2.1 (calc) (ref <5.0)
Triglycerides: 49 mg/dL (ref <150)

## 2020-11-18 LAB — TSH: TSH: 1.51 mIU/L (ref 0.40–4.50)

## 2020-11-18 MED ORDER — BETAMETHASONE VALERATE 0.1 % EX OINT: 1.0000 "application " | TOPICAL_OINTMENT | Freq: Two times a day (BID) | CUTANEOUS | 0 refills | Status: DC

## 2020-11-18 NOTE — Progress Notes (Signed)
GYNECOLOGY  VISIT   HPI: 70 y.o.   Legally Separated  Caucasian  female   No obstetric history on file. with Patient's last menstrual period was 07/19/2000 (approximate).   here for vaginal atrophy.   States she is having vaginal pain for a while.  She feels like something is dropping down in the vagina.  States she does not have control of her bladder.  She can lose control just walking around.  This comes and goes.  Lealage does not occur with cough, laugh, sneeze.   States she had blood in her urine this am.  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   She is not sure if she is having any vaginal bleeding.   States hx of HPV and STD exposure years ago from her husband who was not faithful.  She reports various struggles in their marriage.   Hx of recurrent DVT.  DVT occurred with use of birth control pills as a young woman and then again in 2020 after she had cellulitis.   Hx DES exposure.   PCP visit from 11/10/20 reviewed in Epic.   GYNECOLOGIC HISTORY: Patient's last menstrual period was 07/19/2000 (approximate). Contraception:  PMP Menopausal hormone therapy:  none Last mammogram: pt. States had recently at [facility]--09-09-11 Neg/BiRads1 Last pap smear: 08-31-11 Neg:Neg HR HPV         OB History   No obstetric history on file.        Patient Active Problem List   Diagnosis Date Noted  . Lower leg DVT (deep venous thromboembolism), acute, left (HCC) 07/28/2018  . Cellulitis of left lower extremity   . Leg edema, left 07/26/2018  . Anxiety 08/12/2017  . Chronic low back pain without sciatica 08/12/2017  . Status post revision of total hip 08/12/2017  . Multiple allergies 01/26/2017  . 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 Stricture and stenosis of esophagus 11/30/2013  . Throat pain 11/30/2013  . Diverticulosis of colon without hemorrhage 10/29/2013  . Ekbom's delusional parasitosis (HCC)  11/26/2011  . Allergic rhinitis 03/26/2011  . Attention deficit hyperactivity disorder (ADHD) 03/26/2011  . Acquired hypothyroidism 03/26/2011  . Depression 03/26/2011  . Fibromyalgia 03/26/2011  . History of deep venous thrombosis 03/26/2011  . History of migraine 03/26/2011  . Other mechanical complication of prosthetic joint implant 03/26/2011  . Gastroesophageal reflux disease 03/26/2011  . Zenker's diverticulum 03/26/2011    Past Medical History:  Diagnosis Date  . ADHD   . Anemia   . Cellulitis 07/2018   left lower extremity  . Chronic kidney disease    surgery to fix  . Chronic pain   . Depression    major depression  . DVT (deep venous thrombosis) (HCC)   . Ekbom's delusional parasitosis (HCC)   . Fibromyalgia   . History of Zenker's diverticulum removal   . Lyme disease   . Migraine with aura   . Multiple falls   . Thyroid disease    hypothyroid    Past Surgical History:  Procedure Laterality Date  . APPENDECTOMY  1974  . Avascular necrosis of the hip     multiple operations starting in 2005.  bilateral hips  . Back fusion  1999  . CERVICAL FUSION  [id number]  . EYE SURGERY     eye muscle  . KIDNEY SURGERY  1971   kidney reconstruction  . LAMINECTOMY  1985  . TOTAL HIP ARTHROPLASTY  Current Outpatient Medications  Medication Sig Dispense Refill  . aspirin 81 MG chewable tablet Chew 81 mg by mouth as needed.    . buprenorphine (BUTRANS) 20 MCG/HR PTWK Place onto the skin once a week.    Marland Kitchen buPROPion (WELLBUTRIN XL) 150 MG 24 hr tablet Take 3 tablets by mouth daily.    Marland Kitchen buPROPion HCl ER, XL, 450 MG TB24 Take 450 mg by mouth daily. 30 tablet 3  . Cholecalciferol (VITAMIN D PO) Take 1 capsule by mouth as needed.    . cyclobenzaprine (FLEXERIL) 5 MG tablet Take 1 tablet (5 mg total) by mouth daily as needed for muscle spasms. Take 1 tablet daily and as needed for muscle spasms 30 tablet 5  . dexmethylphenidate (FOCALIN XR) 20 MG 24 hr capsule Take 1  capsule (20 mg total) by mouth daily. 30 capsule 0  . dexmethylphenidate (FOCALIN) 10 MG tablet Take 1 tablet (10 mg total) by mouth daily as needed (if second dose of 20mg  capsule isn't taken). 30 tablet 0  . Diclofenac Sodium CR 100 MG 24 hr tablet Take 1 tablet (100 mg total) by mouth daily. With food 30 tablet 3  . escitalopram (LEXAPRO) 20 MG tablet Take 2 tablets (40 mg total) by mouth daily. 30 tablet 3  . ketoconazole (NIZORAL) 2 % shampoo Apply 1 application topically 2 (two) times a week. 120 mL 0  . methocarbamol (ROBAXIN) 500 MG tablet Take 1 tablet (500 mg total) by mouth as needed for muscle spasms. 1-2 tablets PRN 30 tablet 3  . Rivaroxaban 15 & 20 MG TBPK Take as directed on package: Start with one 15mg  tablet by mouth twice a day with food. On Day 22, switch to one 20mg  tablet once a day with food. 51 each 0  . thyroid (NP THYROID) 60 MG tablet Take 1 tablet (60 mg total) by mouth daily before breakfast. 30 tablet 3  . topiramate (TOPAMAX) 100 MG tablet Take 1 tablet (100 mg total) by mouth every morning. 30 tablet 3   No current facility-administered medications for this visit.     ALLERGIES: Amoxicillin-pot clavulanate, Clarithromycin, Fentanyl, Levofloxacin, Lorazepam, Doxycycline, Penicillins, and Sulfa antibiotics  Family History  Problem Relation Age of Onset  . Cancer Sister   . Hemochromatosis Sister   . Cancer Mother        cancr of unknown origin  . Colon cancer Neg Hx   . Stomach cancer Neg Hx   . Esophageal cancer Neg Hx     Social History   Socioeconomic History  . Marital status: Legally Separated    Spouse name: Not on file  . Number of children: Not on file  . Years of education: Not on file  . Highest education level: Not on file  Occupational History  . Not on file  Tobacco Use  . Smoking status: Never Smoker  . Smokeless tobacco: Never Used  Vaping Use  . Vaping Use: Never used  Substance and Sexual Activity  . Alcohol use: Not Currently     Comment: rarely  . Drug use: No  . Sexual activity: Not Currently    Birth control/protection: Post-menopausal  Other Topics Concern  . Not on file  Social History Narrative   Clinical Psychologist--Dennis McKnight---[phone]      Tobacco use, amount per day now: No   Past tobacco use, amount per day: N/A   How many years did you use tobacco: Never    Alcohol use (drinks per week): Very  rarely   Diet: Very health eating   Do you drink/eat things with caffeine: Very rarely   Marital status: Seperated                                 What year were you married? 1991   Do you live in a house, apartment, assisted living, condo, trailer, etc.? House   Is it one or more stories? 2   How many persons live in your home? 3 ( including me )   Do you have pets in your home?( please list)   Highest Level of education completed? Advanced Degree   Current or past profession: Many   Do you exercise? Not currently due to injury.                                 Type and how often?   Do you have a living will? No.   Do you have a DNR form? No.                                  If not, do you want to discuss one?   Do you have signed POA/HPOA forms? No.                       If so, please bring to you appointment      Do you have any difficulty bathing or dressing yourself? No.   Do you have any difficulty preparing food or eating? No.   Do you have any difficulty managing your medications? No.   Do you have any difficulty managing your finances? No.   Do you have any difficulty affording your medications? No.   Social Determinants of Health   Financial Resource Strain: Not on file  Food Insecurity: Not on file  Transportation Needs: Not on file  Physical Activity: Not on file  Stress: Not on file  Social Connections: Not on file  Intimate Partner Violence: Not on file    Review of Systems  See note  PHYSICAL EXAMINATION:    BP 124/76   Pulse 89   Ht 4' 11.5" (1.511 m)   Wt  125 lb (56.7 kg)   LMP 07/19/2000 (Approximate)   SpO2 98%   BMI 24.82 kg/m     General appearance: alert, cooperative and appears stated age.  Tearful at times during the visit.    Lungs: clear to auscultation bilaterally Heart: regular rate and rhythm Abdomen: soft, non-tender, no masses,  no organomegaly No abnormal inguinal nodes palpated   Pelvic: External genitalia:  supraclitoral fissure.               Urethra:  normal appearing urethra with no masses, tenderness or lesions              Bartholins and Skenes: normal                 Vagina: normal appearing vagina with normal color and discharge, no lesions              Cervix: no lesions                Bimanual Exam:  Uterus:  normal size, contour, position, consistency, mobility, non-tender  Adnexa: no mass, fullness, tenderness              Rectal exam: Yes.  .  Confirms.              Anus:  normal sphincter tone, no lesions  Chaperone was present for exam.  ASSESSMENT  Clitoral fissure.  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  Urinary incontinence.  DES exposure.  Vaginal atrophy.  Hx DVT.  Not estrogen candidate.  Complex medical history.   PLAN  Pap with reflex HPV testing taken today.  Betamethasone bid to vulva in thin layer for 2 weeks. Fu in 2 weeks.  If not improved, may need vulvar biopsy. Check urinalysis:  0 - 5 WBC, 3 - 10 BC, 6 - 10 epis, few bacteria.  UC sent.  On chart review after the visit, I see a referral to urology.  Support given for patient's health and personal difficulties.   45 min  total time was spent for this patient encounter, including preparation, face-to-face counseling with the patient, coordination of care, and documentation of the encounter.

## 2020-11-19 ENCOUNTER — Telehealth: Payer: Self-pay | Admitting: *Deleted

## 2020-11-19 LAB — CYTOLOGY - PAP: Diagnosis: NEGATIVE

## 2020-11-19 NOTE — Telephone Encounter (Signed)
Received fax from [facility] requesting Prior Authorization for patient's Dexmethylphenidate.  Filled out form and placed in Dinah's folder to review and sign.  Patient ID: [id number] To be faxed back to [phone] once completed.

## 2020-11-20 LAB — URINALYSIS, COMPLETE W/RFL CULTURE
Bilirubin Urine: NEGATIVE
Glucose, UA: NEGATIVE
Hyaline Cast: NONE SEEN /LPF
Ketones, ur: NEGATIVE
Leukocyte Esterase: NEGATIVE
Nitrites, Initial: NEGATIVE
Protein, ur: NEGATIVE
Specific Gravity, Urine: 1.02 (ref 1.001–1.035)
pH: 5 (ref 5.0–8.0)

## 2020-11-20 LAB — URINE CULTURE
MICRO NUMBER:: 11843493
Result:: NO GROWTH
SPECIMEN QUALITY:: ADEQUATE

## 2020-11-20 LAB — CULTURE INDICATED

## 2020-11-21 ENCOUNTER — Ambulatory Visit: Payer: Self-pay | Admitting: Family

## 2020-11-24 ENCOUNTER — Ambulatory Visit (INDEPENDENT_AMBULATORY_CARE_PROVIDER_SITE_OTHER): Payer: Medicare Other | Admitting: Family

## 2020-11-24 ENCOUNTER — Other Ambulatory Visit: Payer: Self-pay

## 2020-11-24 ENCOUNTER — Encounter: Payer: Self-pay | Admitting: Family

## 2020-11-24 ENCOUNTER — Other Ambulatory Visit: Payer: Self-pay | Admitting: *Deleted

## 2020-11-24 VITALS — BP 122/70 | HR 63 | Temp 97.3°F | Resp 18 | Ht 59.0 in | Wt 129.0 lb

## 2020-11-24 DIAGNOSIS — F909 Attention-deficit hyperactivity disorder, unspecified type: Secondary | ICD-10-CM

## 2020-11-24 DIAGNOSIS — B89 Unspecified parasitic disease: Secondary | ICD-10-CM

## 2020-11-24 MED ORDER — RITALIN 5 MG PO TABS: 5.0000 mg | ORAL_TABLET | Freq: Every day | ORAL | 0 refills | Status: DC | PRN

## 2020-11-24 MED ORDER — RITALIN 20 MG PO TABS: 20.0000 mg | ORAL_TABLET | Freq: Every day | ORAL | 0 refills | Status: DC

## 2020-11-24 MED ORDER — METHYLPHENIDATE HCL 5 MG PO TABS: 5.0000 mg | ORAL_TABLET | Freq: Every day | ORAL | 0 refills | Status: DC | PRN

## 2020-11-24 MED ORDER — METHYLPHENIDATE HCL 20 MG PO TABS: 20.0000 mg | ORAL_TABLET | Freq: Every day | ORAL | 0 refills | Status: DC

## 2020-11-24 NOTE — Telephone Encounter (Signed)
Patient called requesting BRAND ONLY for Ritalin Rx's.  Pended Rx's and sent to [facility] for approval.

## 2020-11-24 NOTE — Progress Notes (Signed)
Provider: Richarda Bladeinah Sigismund Cross FNP-C  Larua Collier, Donalee Citrininah C, NP  Patient Care Team: Rickelle Sylvestre, Donalee Citrininah C, NP as PCP - General (Family Medicine)  Extended Emergency Contact Information Primary Emergency Contact: Juanda ChancePickard,Peter  [place] Mobile Phone: [phone] Relation: Son Secondary Emergency Contact: Birder RobsonPickard,Frank III Address: [address]          [place], [place] [id number] [place] Home Phone: [phone] Mobile Phone: [phone] Relation: Spouse  Code Status:  Full code  Goals of care: Advanced Directive information Advanced Directives 11/24/2020  Does Patient Have a Medical Advance Directive? No  Does patient want to make changes to medical advance directive? No - Patient declined  Would patient like information on creating a medical advance directive? -     Chief Complaint  Patient presents with  . Follow-up    Follow up. Patient states she has been taking focalin as prescribed with little to no relief.   . Health Maintenance    Hepatitis C screening, TD/Tdap vaccine, mammogram, colonoscopy, and DEXA scan.     HPI:  Pt is a 70 y.o. female seen today for an acute visit for medication concerns.She is currently on Focalin 20 mg capsule daily and 10 mg capsule daily as needed.States does not like how Focalin makes her feel she would like to switch back to Ritalin which has worked well for her without any side effects.  States was seen by Gynecologist had dark urine but was negative for UTI.Has follow up appointment on 12/03/2020.  Continue to complain of chronic parasite infestation under her skin and in her stool.would like to see Infectious disease.reported in the previous visit that stool sample was verified by a veterinerian    Past Medical History:  Diagnosis Date  . ADHD   . Anemia   . Cellulitis 07/2018   left lower extremity  . Chronic kidney disease    surgery to fix  . Chronic pain   . Depression    major depression  . DVT (deep  venous thrombosis) (HCC)   . Ekbom's delusional parasitosis (HCC)   . Fibromyalgia   . History of Zenker's diverticulum removal   . Lyme disease   . Migraine with aura   . Multiple falls   . Thyroid disease    hypothyroid   Past Surgical History:  Procedure Laterality Date  . APPENDECTOMY  1974  . Avascular necrosis of the hip     multiple operations starting in 2005.  bilateral hips  . Back fusion  1999  . CERVICAL FUSION  [id number]  . EYE SURGERY     eye muscle  . KIDNEY SURGERY  1971   kidney reconstruction  . LAMINECTOMY  1985  . TOTAL HIP ARTHROPLASTY      Allergies  Allergen Reactions  . Amoxicillin-Pot Clavulanate Nausea And Vomiting and Other (See Comments)  . Clarithromycin Other (See Comments)    DOES NOT REMEMBER REACTION DOES NOT REMEMBER REACTION   . Fentanyl Nausea And Vomiting and Other (See Comments)    FENTANYL PATCH FENTANYL PATCH FENTANYL PATCH   . Levofloxacin Swelling    Face,eye swelling Face,eye swelling   . Lorazepam Other (See Comments)    DO NOT PUT ANY ATIVAN ON PATIENT'S IV POST SURGERY! DO NOT PUT ANY ATIVAN ON PATIENT'S IV POST SURGERY!   . Doxycycline Nausea And Vomiting  . Penicillins Hives  . Sulfa Antibiotics Other (See Comments)    Per pt: unknown    Outpatient Encounter Medications as of 11/24/2020  Medication Sig  . aspirin 81 MG chewable tablet Chew 81 mg by mouth as needed.  . betamethasone valerate ointment (VALISONE) 0.1 % Apply 1 application topically 2 (two) times daily. Use twice a day for 2 weeks.  . buprenorphine (BUTRANS) 20 MCG/HR PTWK Place 1 patch onto the skin once a week.  Marland Kitchen buPROPion (WELLBUTRIN XL) 150 MG 24 hr tablet Take 3 tablets by mouth daily.  Marland Kitchen buPROPion HCl ER, XL, 450 MG TB24 Take 450 mg by mouth daily.  . Cholecalciferol (VITAMIN D PO) Take 1 capsule by mouth as needed.  . cyclobenzaprine (FLEXERIL) 5 MG tablet Take 1 tablet (5 mg total) by mouth daily as needed for muscle spasms. Take 1 tablet  daily and as needed for muscle spasms  . Diclofenac Sodium CR 100 MG 24 hr tablet Take 1 tablet (100 mg total) by mouth daily. With food  . escitalopram (LEXAPRO) 20 MG tablet Take 2 tablets (40 mg total) by mouth daily.  Marland Kitchen ketoconazole (NIZORAL) 2 % shampoo Apply 1 application topically 2 (two) times a week.  . methocarbamol (ROBAXIN) 500 MG tablet Take 1 tablet (500 mg total) by mouth as needed for muscle spasms. 1-2 tablets PRN  . methylphenidate (RITALIN) 20 MG tablet Take 1 tablet (20 mg total) by mouth daily.  . methylphenidate (RITALIN) 5 MG tablet Take 1 tablet (5 mg total) by mouth daily as needed.  . Rivaroxaban 15 & 20 MG TBPK Take as directed on package: Start with one 15mg  tablet by mouth twice a day with food. On Day 22, switch to one 20mg  tablet once a day with food.  . thyroid (NP THYROID) 60 MG tablet Take 1 tablet (60 mg total) by mouth daily before breakfast.  . topiramate (TOPAMAX) 100 MG tablet Take 1 tablet (100 mg total) by mouth every morning.  . [DISCONTINUED] dexmethylphenidate (FOCALIN XR) 20 MG 24 hr capsule Take 1 capsule (20 mg total) by mouth daily.  . [DISCONTINUED] dexmethylphenidate (FOCALIN) 10 MG tablet Take 1 tablet (10 mg total) by mouth daily as needed (if second dose of 20mg  capsule isn't taken).  . [DISCONTINUED] buprenorphine (BUTRANS) 20 MCG/HR PTWK Place onto the skin once a week.   No facility-administered encounter medications on file as of 11/24/2020.    Review of Systems  Constitutional: Negative for appetite change, chills, fatigue, fever and unexpected weight change.  Eyes: Negative for pain, discharge, redness, itching and visual disturbance.  Respiratory: Negative for cough, chest tightness, shortness of breath and wheezing.   Cardiovascular: Negative for chest pain, palpitations and leg swelling.  Gastrointestinal: Negative for abdominal distention, abdominal pain, blood in stool, constipation, diarrhea, nausea and vomiting.  Genitourinary:  Negative for difficulty urinating, dysuria, flank pain, frequency and urgency.  Musculoskeletal: Positive for arthralgias and gait problem. Negative for back pain, joint swelling, myalgias, neck pain and neck stiffness.  Skin: Negative for color change, pallor, rash and wound.  Neurological: Negative for dizziness, syncope, speech difficulty, weakness, light-headedness, numbness and headaches.  Hematological: Does not bruise/bleed easily.  Psychiatric/Behavioral: Negative for agitation, behavioral problems, confusion, hallucinations, self-injury, sleep disturbance and suicidal ideas. The patient is not nervous/anxious.        ADHD    Immunization History  Administered Date(s) Administered  . Influenza, High Dose Seasonal PF 06/09/2018, 09/07/2019  . PFIZER(Purple Top)SARS-COV-2 Vaccination 11/19/2019, 02/12/2020  . Pneumococcal Conjugate-13 08/22/2015  . Pneumococcal Polysaccharide-23 11/10/2020   Pertinent  Health Maintenance Due  Topic Date Due  . MAMMOGRAM  09/08/2013  .  DEXA SCAN  Never done  . INFLUENZA VACCINE  02/16/2021  . COLONOSCOPY (Pts 45-47yrs Insurance coverage will need to be confirmed)  12/01/2021  . PNA vac Low Risk Adult  Completed   Fall Risk  11/24/2020 11/10/2020  Falls in the past year? 0 0  Number falls in past yr: 0 0  Injury with Fall? 0 0   Functional Status Survey:    Vitals:   11/24/20 1158  BP: 122/70  Pulse: 63  Resp: 18  Temp: (!) 97.3 F (36.3 C)  TempSrc: Temporal  SpO2: 95%  Weight: 129 lb (58.5 kg)  Height: 4\' 11"  (1.499 m)   Body mass index is 26.05 kg/m. Physical Exam Vitals reviewed.  Constitutional:      General: She is not in acute distress.    Appearance: Normal appearance. She is normal weight. She is not ill-appearing or diaphoretic.  HENT:     Head: Normocephalic.     Mouth/Throat:     Mouth: Mucous membranes are moist.     Pharynx: Oropharynx is clear. No oropharyngeal exudate or posterior oropharyngeal erythema.  Eyes:      General: No scleral icterus.       Right eye: No discharge.        Left eye: No discharge.     Conjunctiva/sclera: Conjunctivae normal.     Pupils: Pupils are equal, round, and reactive to light.  Neck:     Vascular: No carotid bruit.  Cardiovascular:     Rate and Rhythm: Normal rate and regular rhythm.     Pulses: Normal pulses.     Heart sounds: Normal heart sounds. No murmur heard. No friction rub. No gallop.   Pulmonary:     Effort: Pulmonary effort is normal. No respiratory distress.     Breath sounds: Normal breath sounds. No wheezing, rhonchi or rales.  Chest:     Chest wall: No tenderness.  Abdominal:     General: Bowel sounds are normal. There is no distension.     Palpations: Abdomen is soft. There is no mass.     Tenderness: There is no abdominal tenderness. There is no right CVA tenderness, left CVA tenderness, guarding or rebound.  Musculoskeletal:        General: No swelling or tenderness.     Cervical back: Normal range of motion. No rigidity or tenderness.     Thoracic back: Scoliosis present.     Right lower leg: No edema.     Left lower leg: No edema.     Comments: Unsteady gait   Lymphadenopathy:     Cervical: No cervical adenopathy.  Skin:    General: Skin is warm and dry.     Coloration: Skin is not pale.     Findings: No bruising, erythema, lesion or rash.  Neurological:     Mental Status: She is alert and oriented to person, place, and time.     Cranial Nerves: No cranial nerve deficit.     Sensory: No sensory deficit.     Motor: No weakness.     Coordination: Coordination normal.     Gait: Gait abnormal.  Psychiatric:        Mood and Affect: Mood normal.        Speech: Speech normal.        Behavior: Behavior normal.        Thought Content: Thought content normal.        Judgment: Judgment normal.     Labs reviewed: Recent  Labs    09/12/20 1716 11/17/20 1209  NA 139 139  K 3.6 4.2  CL 106 106  CO2 23 20  GLUCOSE 88 76  BUN 20 17   CREATININE 1.01* 0.86  CALCIUM 9.0 9.8   Recent Labs    11/17/20 1209  AST 22  ALT 17  BILITOT 0.6  PROT 7.1   Recent Labs    09/12/20 1716 11/17/20 1209  WBC 7.6 5.0  NEUTROABS  --  2,825  HGB 13.1 14.2  HCT 40.9 44.2  MCV 90.9 88.9  PLT 272 361   Lab Results  Component Value Date   TSH 1.51 11/17/2020   No results found for: HGBA1C Lab Results  Component Value Date   CHOL 164 11/17/2020   HDL 78 11/17/2020   LDLCALC 72 11/17/2020   TRIG 49 11/17/2020   CHOLHDL 2.1 11/17/2020    Significant Diagnostic Results in last 30 days:  No results found.  Assessment/Plan  1. Attention deficit hyperactivity disorder (ADHD), unspecified ADHD type Discontinue Focalin due to side effects.restart Ritalin as below. Will need resign contract on next visit.  - methylphenidate (RITALIN) 20 MG tablet; Take 1 tablet (20 mg total) by mouth daily.  Dispense: 30 tablet; Refill: 0 - methylphenidate (RITALIN) 5 MG tablet; Take 1 tablet (5 mg total) by mouth daily as needed.  Dispense: 30 tablet; Refill: 0  2. Parasitosis Reports persistent symptoms and visible worms under the skin and stool.Previousdiagnosed with Ekbom's delusional parasitosis  - Ambulatory referral to Infectious Disease  Family/ staff Communication: Reviewed plan of care with patient  Labs/tests ordered: None   Next Appointment: Has follow up appointment already.   Caesar Bookman, NP

## 2020-11-25 ENCOUNTER — Telehealth: Payer: Self-pay | Admitting: *Deleted

## 2020-11-25 NOTE — Telephone Encounter (Signed)
Pended Rx and sent to [facility] for approval.  ?

## 2020-11-25 NOTE — Telephone Encounter (Signed)
Patient didn't request LA during visit.May change to Ritalin LA 20 mg capsule one by mouth daily.

## 2020-11-25 NOTE — Telephone Encounter (Signed)
Patient called and stated that her Ritalin 20mg  is suppose to be: Ritalin LA 20mg  Capsule One tablet by mouth daily.   Is this ok to change in current medication list and send Rx to pharmacy.  Patient stated NOT to do a Prior authorization because she pays out of pocket.   Please Advise.

## 2020-11-26 ENCOUNTER — Telehealth: Payer: Self-pay

## 2020-11-26 NOTE — Telephone Encounter (Signed)
Last fill was 11/12/20 she is not due until 5/27

## 2020-11-26 NOTE — Telephone Encounter (Signed)
Per the [facility] database there was a fill date on 11/12/20 on the Dexmethylphenidate Er 20 Mg Cp She also got the Dexmethylphenidate 10 Mg Tab on 4/26

## 2020-11-26 NOTE — Telephone Encounter (Signed)
Patient has not received this medication yet because it required Prior authorization and the wrong Rx was sent to the pharmacy. Patient was requesting the ER  I called [facility] [phone] and spoke with pharmacist and he stated that patient did not pick up Ritalin 20mg  because she was wanting the Ritalin LA 20mg .   Please Advise.

## 2020-11-26 NOTE — Telephone Encounter (Signed)
Patient stated that the Dr. Claiborne Billings originally prescribed medication was Dr. Dub Mikes that is now deceased.  Stated that she has taken this medication since she was 70 years old and it is medically necessary.  Stated that she is very aware of the black box Warnings but needs the medication.   Please Advise.

## 2020-11-26 NOTE — Telephone Encounter (Signed)
Since I am not her provider and covering I am going to need her past medical records on who originally prescribed medication. This holds a black box warning and she needs to be aware of the risk involved with prescribing medication. I will be glad to mail her copies of the warnings so she is aware of this. I would also recommend a neuropsychology evaluation.

## 2020-11-26 NOTE — Telephone Encounter (Signed)
Representative from [employer] states that patient PA has been approved for medication Dexmethylphenidate from date 11/25/2020-11/26/2021.

## 2020-11-26 NOTE — Telephone Encounter (Signed)
I called the [name] and spoke with Pharmacist, Onalee Hua. He stated that patient did pick up the Methylphenidate (Foculin) on 4/27, stated it was not the same as Ritalin. I told him that we would be unable to refill until 5/27. He stated that he was not sure himself why patient was trying to change medications.    I called the patient and she stated that she did pick up the Methylphenidate on 4/27 but she is unable to take the medication. Stated that it does not work for her and is requesting the Ritalin LA 20mg  instead.  Stated that she will bring the bottle of medication to our office and leave it and we can discard it.  Stated that she does not want anyone to think she is hoarding medication. Stated that she really needs the Ritalin LA 20mg . And cannot wait till 5/27.   Please Advise.

## 2020-11-27 ENCOUNTER — Other Ambulatory Visit: Payer: Self-pay | Admitting: Orthopedic Surgery

## 2020-11-27 NOTE — Telephone Encounter (Signed)
Patient notified and agreed.  

## 2020-11-27 NOTE — Telephone Encounter (Signed)
Awaiting records but upon investigation it looks like [name] sent in a prescription for the dexmethylphenidate (Focalin) on her original appt - she picked this up on 11/12/20. she actually picked up 2 prescriptions for this a 20 mg tablet and 10 mg tablet.  Then she saw [name] again on 11/24/20 and stated she wanted Methylphenidate (Ritalin) 20 mg which [name] then sent to the pharmacy as well. She can pick that up on 5/27 from [facility]. If she does not agree with this she is welcomed to seek another opinion from a psychiatrist and we can give recommendations for psychiatrist in the area.

## 2020-12-01 ENCOUNTER — Ambulatory Visit: Payer: Medicare Other | Admitting: Obstetrics and Gynecology

## 2020-12-01 NOTE — Progress Notes (Deleted)
GYNECOLOGY  VISIT   HPI: 70 y.o.   Legally Separated  Caucasian  female   No obstetric history on file. with Patient's last menstrual period was 07/19/2000 (approximate).   here for follow up.     GYNECOLOGIC HISTORY: Patient's last menstrual period was 07/19/2000 (approximate). Contraception:  PMP Menopausal hormone therapy:  none Last mammogram: pt. States had recently at [facility]--09-09-11 Neg/BiRads1 Last pap smear: 08-31-11 Neg:Neg HR HPV         OB History   No obstetric history on file.        Patient Active Problem List   Diagnosis Date Noted  . Lower leg DVT (deep venous thromboembolism), acute, left (HCC) 07/28/2018  . Cellulitis of left lower extremity   . Leg edema, left 07/26/2018  . Anxiety 08/12/2017  . Chronic low back pain without sciatica 08/12/2017  . Status post revision of total hip 08/12/2017  . Multiple allergies 01/26/2017  . 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 Stricture and stenosis of esophagus 11/30/2013  . Throat pain 11/30/2013  . Diverticulosis of colon without hemorrhage 10/29/2013  . 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 Allergic rhinitis 03/26/2011  . Attention deficit hyperactivity disorder (ADHD) 03/26/2011  . Acquired hypothyroidism 03/26/2011  . Depression 03/26/2011  . Fibromyalgia 03/26/2011  . History of deep venous thrombosis 03/26/2011  . History of migraine 03/26/2011  . Other mechanical complication of prosthetic joint implant 03/26/2011  . Gastroesophageal reflux disease 03/26/2011  . Zenker's diverticulum 03/26/2011    Past Medical History:  Diagnosis Date  . ADHD   . Anemia   . Cellulitis 07/2018   left lower extremity  . Chronic kidney disease    surgery to fix  . Chronic pain   . Depression    major depression  . DVT (deep venous thrombosis) (HCC)   . Ekbom's delusional parasitosis (HCC)   . Fibromyalgia   . History of Zenker's diverticulum removal   . Lyme disease   . Migraine with aura    . Multiple falls   . Thyroid disease    hypothyroid    Past Surgical History:  Procedure Laterality Date  . APPENDECTOMY  1974  . Avascular necrosis of the hip     multiple operations starting in 2005.  bilateral hips  . Back fusion  1999  . CERVICAL FUSION  [id number]  . EYE SURGERY     eye muscle  . KIDNEY SURGERY  1971   kidney reconstruction  . LAMINECTOMY  1985  . TOTAL HIP ARTHROPLASTY      Current Outpatient Medications  Medication Sig Dispense Refill  . aspirin 81 MG chewable tablet Chew 81 mg by mouth as needed.    . betamethasone valerate ointment (VALISONE) 0.1 % Apply 1 application topically 2 (two) times daily. Use twice a day for 2 weeks. 45 g 0  . buprenorphine (BUTRANS) 20 MCG/HR PTWK Place 1 patch onto the skin once a week.    Marland Kitchen buPROPion (WELLBUTRIN XL) 150 MG 24 hr tablet Take 3 tablets by mouth daily.    Marland Kitchen buPROPion HCl ER, XL, 450 MG TB24 Take 450 mg by mouth daily. 30 tablet 3  . Cholecalciferol (VITAMIN D PO) Take 1 capsule by mouth as needed.    . cyclobenzaprine (FLEXERIL) 5 MG tablet Take 1 tablet (5 mg total) by mouth daily as needed for muscle spasms. Take 1 tablet daily and as needed for muscle spasms 30 tablet 5  . Diclofenac Sodium CR 100 MG  24 hr tablet Take 1 tablet (100 mg total) by mouth daily. With food 30 tablet 3  . escitalopram (LEXAPRO) 20 MG tablet Take 2 tablets (40 mg total) by mouth daily. 30 tablet 3  . ketoconazole (NIZORAL) 2 % shampoo Apply 1 application topically 2 (two) times a week. 120 mL 0  . methocarbamol (ROBAXIN) 500 MG tablet Take 1 tablet (500 mg total) by mouth as needed for muscle spasms. 1-2 tablets PRN 30 tablet 3  . methylphenidate (RITALIN LA) 20 MG 24 hr capsule Take 20 mg by mouth daily.    Marland Kitchen RITALIN 5 MG tablet Take 1 tablet (5 mg total) by mouth daily as needed. 30 tablet 0  . Rivaroxaban 15 & 20 MG TBPK Take as directed on package: Start with one 15mg  tablet by mouth twice a day with food. On Day 22, switch to  one 20mg  tablet once a day with food. 51 each 0  . thyroid (NP THYROID) 60 MG tablet Take 1 tablet (60 mg total) by mouth daily before breakfast. 30 tablet 3  . topiramate (TOPAMAX) 100 MG tablet Take 1 tablet (100 mg total) by mouth every morning. 30 tablet 3   No current facility-administered medications for this visit.     ALLERGIES: Amoxicillin-pot clavulanate, Clarithromycin, Fentanyl, Levofloxacin, Lorazepam, Doxycycline, Penicillins, and Sulfa antibiotics  Family History  Problem Relation Age of Onset  . Cancer Sister   . Hemochromatosis Sister   . Cancer Mother        cancr of unknown origin  . Colon cancer Neg Hx   . Stomach cancer Neg Hx   . Esophageal cancer Neg Hx     Social History   Socioeconomic History  . Marital status: Legally Separated    Spouse name: Not on file  . Number of children: Not on file  . Years of education: Not on file  . Highest education level: Not on file  Occupational History  . Not on file  Tobacco Use  . Smoking status: Never Smoker  . Smokeless tobacco: Never Used  Vaping Use  . Vaping Use: Never used  Substance and Sexual Activity  . Alcohol use: Not Currently    Comment: rarely  . Drug use: No  . Sexual activity: Not Currently    Birth control/protection: Post-menopausal  Other Topics Concern  . Not on file  Social History Narrative   Clinical Psychologist--Dennis McKnight---[phone]      Tobacco use, amount per day now: No   Past tobacco use, amount per day: N/A   How many years did you use tobacco: Never    Alcohol use (drinks per week): Very rarely   Diet: Very health eating   Do you drink/eat things with caffeine: Very rarely   Marital status: Seperated                                 What year were you married? 1991   Do you live in a house, apartment, assisted living, condo, trailer, etc.? House   Is it one or more stories? 2   How many persons live in your home? 3 ( including me )   Do you have pets in your  home?( please list)   Highest Level of education completed? Advanced Degree   Current or past profession: Many   Do you exercise? Not currently due to injury.  Type and how often?   Do you have a living will? No.   Do you have a DNR form? No.                                  If not, do you want to discuss one?   Do you have signed POA/HPOA forms? No.                       If so, please bring to you appointment      Do you have any difficulty bathing or dressing yourself? No.   Do you have any difficulty preparing food or eating? No.   Do you have any difficulty managing your medications? No.   Do you have any difficulty managing your finances? No.   Do you have any difficulty affording your medications? No.   Social Determinants of Health   Financial Resource Strain: Not on file  Food Insecurity: Not on file  Transportation Needs: Not on file  Physical Activity: Not on file  Stress: Not on file  Social Connections: Not on file  Intimate Partner Violence: Not on file    Review of Systems  PHYSICAL EXAMINATION:    LMP 07/19/2000 (Approximate)     General appearance: alert, cooperative and appears stated age Head: Normocephalic, without obvious abnormality, atraumatic Neck: no adenopathy, supple, symmetrical, trachea midline and thyroid normal to inspection and palpation Lungs: clear to auscultation bilaterally Breasts: normal appearance, no masses or tenderness, No nipple retraction or dimpling, No nipple discharge or bleeding, No axillary or supraclavicular adenopathy Heart: regular rate and rhythm Abdomen: soft, non-tender, no masses,  no organomegaly Extremities: extremities normal, atraumatic, no cyanosis or edema Skin: Skin color, texture, turgor normal. No rashes or lesions Lymph nodes: Cervical, supraclavicular, and axillary nodes normal. No abnormal inguinal nodes palpated Neurologic: Grossly normal  Pelvic: External genitalia:  no  lesions              Urethra:  normal appearing urethra with no masses, tenderness or lesions              Bartholins and Skenes: normal                 Vagina: normal appearing vagina with normal color and discharge, no lesions              Cervix: no lesions                Bimanual Exam:  Uterus:  normal size, contour, position, consistency, mobility, non-tender              Adnexa: no mass, fullness, tenderness              Rectal exam: {yes no:[id number]}.  Confirms.              Anus:  normal sphincter tone, no lesions  Chaperone was present for exam.  ASSESSMENT     PLAN     An After Visit Summary was printed and given to the patient.  ______ minutes face to face time of which over 50% was spent in counseling.

## 2020-12-01 NOTE — Progress Notes (Signed)
GYNECOLOGY  VISIT   HPI: 70 y.o.   Legally Separated  Caucasian  female   No obstetric history on file. with [name] last menstrual period was 07/19/2000 (approximate).   here for follow up. [name] states symptoms have not improved.   She was using the betamethasone ointment hour time a day.  The vulva feels better.   She is worried about her pap and HPV.  GYNECOLOGIC HISTORY: [name] last menstrual period was 07/19/2000 (approximate). Contraception:  PMP Menopausal hormone therapy: none Last mammogram: pt. States had recently at [facility]--09-09-11 Neg/BiRads1 Last pap smear: 11/18/20 - normal pap, 08-31-11 Neg:Neg HR HPV        OB History   No obstetric history on file.        [name] Active Problem List   Diagnosis Date Noted  . Lower leg DVT (deep venous thromboembolism), acute, left (HCC) 07/28/2018  . Cellulitis of left lower extremity   . Leg edema, left 07/26/2018  . Anxiety 08/12/2017  . Chronic low back pain without sciatica 08/12/2017  . Status post revision of total hip 08/12/2017  . Multiple allergies 01/26/2017  . 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 Stricture and stenosis of esophagus 11/30/2013  . Throat pain 11/30/2013  . Diverticulosis of colon without hemorrhage 10/29/2013  . 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 Allergic rhinitis 03/26/2011  . Attention deficit hyperactivity disorder (ADHD) 03/26/2011  . Acquired hypothyroidism 03/26/2011  . Depression 03/26/2011  . Fibromyalgia 03/26/2011  . History of deep venous thrombosis 03/26/2011  . History of migraine 03/26/2011  . Other mechanical complication of prosthetic joint implant 03/26/2011  . Gastroesophageal reflux disease 03/26/2011  . Zenker's diverticulum 03/26/2011    Past Medical History:  Diagnosis Date  . ADHD   . Anemia   . Cellulitis 07/2018   left lower extremity  . Chronic kidney disease    surgery to fix  . Chronic pain   . Depression    major  depression  . DVT (deep venous thrombosis) (HCC)   . Ekbom's delusional parasitosis (HCC)   . Fibromyalgia   . History of Zenker's diverticulum removal   . Lyme disease   . Migraine with aura   . Multiple falls   . Thyroid disease    hypothyroid    Past Surgical History:  Procedure Laterality Date  . APPENDECTOMY  1974  . Avascular necrosis of the hip     multiple operations starting in 2005.  bilateral hips  . Back fusion  1999  . CERVICAL FUSION  [id number]  . EYE SURGERY     eye muscle  . KIDNEY SURGERY  1971   kidney reconstruction  . LAMINECTOMY  1985  . TOTAL HIP ARTHROPLASTY      Current Outpatient Medications  Medication Sig Dispense Refill  . aspirin 81 MG chewable tablet Chew 81 mg by mouth as needed.    . betamethasone valerate ointment (VALISONE) 0.1 % Apply 1 application topically 2 (two) times daily. Use twice a day for 2 weeks. 45 g 0  . buprenorphine (BUTRANS) 20 MCG/HR PTWK Place 1 patch onto the skin once a week.    Marland Kitchen buPROPion (WELLBUTRIN XL) 150 MG 24 hr tablet Take 3 tablets by mouth daily.    Marland Kitchen buPROPion HCl ER, XL, 450 MG TB24 Take 450 mg by mouth daily. 30 tablet 3  . Cholecalciferol (VITAMIN D PO) Take 1 capsule by mouth as needed.    . cyclobenzaprine (FLEXERIL) 5 MG tablet Take 1  tablet (5 mg total) by mouth daily as needed for muscle spasms. Take 1 tablet daily and as needed for muscle spasms 30 tablet 5  . Diclofenac Sodium CR 100 MG 24 hr tablet Take 1 tablet (100 mg total) by mouth daily. With food 30 tablet 3  . escitalopram (LEXAPRO) 20 MG tablet Take 2 tablets (40 mg total) by mouth daily. 30 tablet 3  . ketoconazole (NIZORAL) 2 % shampoo Apply 1 application topically 2 (two) times a week. 120 mL 0  . methocarbamol (ROBAXIN) 500 MG tablet Take 1 tablet (500 mg total) by mouth as needed for muscle spasms. 1-2 tablets PRN 30 tablet 3  . methylphenidate (RITALIN LA) 20 MG 24 hr capsule Take 20 mg by mouth daily.    Marland Kitchen RITALIN 5 MG tablet Take 1  tablet (5 mg total) by mouth daily as needed. 30 tablet 0  . Rivaroxaban 15 & 20 MG TBPK Take as directed on package: Start with one 15mg  tablet by mouth twice a day with food. On Day 22, switch to one 20mg  tablet once a day with food. 51 each 0  . thyroid (NP THYROID) 60 MG tablet Take 1 tablet (60 mg total) by mouth daily before breakfast. 30 tablet 3  . topiramate (TOPAMAX) 100 MG tablet Take 1 tablet (100 mg total) by mouth every morning. 30 tablet 3   No current facility-administered medications for this visit.     ALLERGIES: Amoxicillin-pot clavulanate, Clarithromycin, Fentanyl, Levofloxacin, Lorazepam, Doxycycline, Penicillins, and Sulfa antibiotics  Family History  Problem Relation Age of Onset  . Cancer Sister   . Hemochromatosis Sister   . Cancer Mother        cancr of unknown origin  . Colon cancer Neg Hx   . Stomach cancer Neg Hx   . Esophageal cancer Neg Hx     Social History   Socioeconomic History  . Marital status: Legally Separated    Spouse name: Not on file  . Number of children: Not on file  . Years of education: Not on file  . Highest education level: Not on file  Occupational History  . Not on file  Tobacco Use  . Smoking status: Never Smoker  . Smokeless tobacco: Never Used  Vaping Use  . Vaping Use: Never used  Substance and Sexual Activity  . Alcohol use: Not Currently    Comment: rarely  . Drug use: No  . Sexual activity: Not Currently    Birth control/protection: Post-menopausal  Other Topics Concern  . Not on file  Social History Narrative   Clinical Psychologist--Dennis McKnight---[phone]      Tobacco use, amount per day now: No   Past tobacco use, amount per day: N/A   How many years did you use tobacco: Never    Alcohol use (drinks per week): Very rarely   Diet: Very health eating   Do you drink/eat things with caffeine: Very rarely   Marital status: Seperated                                 What year were you married? 1991    Do you live in a house, apartment, assisted living, condo, trailer, etc.? House   Is it one or more stories? 2   How many persons live in your home? 3 ( including me )   Do you have pets in your home?( please list)   Highest Level  of education completed? Advanced Degree   Current or past profession: Many   Do you exercise? Not currently due to injury.                                 Type and how often?   Do you have a living will? No.   Do you have a DNR form? No.                                  If not, do you want to discuss one?   Do you have signed POA/HPOA forms? No.                       If so, please bring to you appointment      Do you have any difficulty bathing or dressing yourself? No.   Do you have any difficulty preparing food or eating? No.   Do you have any difficulty managing your medications? No.   Do you have any difficulty managing your finances? No.   Do you have any difficulty affording your medications? No.   Social Determinants of Health   Financial Resource Strain: Not on file  Food Insecurity: Not on file  Transportation Needs: Not on file  Physical Activity: Not on file  Stress: Not on file  Social Connections: Not on file  Intimate Partner Violence: Not on file    Review of Systems  All other systems reviewed and are negative.   PHYSICAL EXAMINATION:    BP 130/60   Pulse 91   Ht 4' 11.5" (1.511 m)   Wt 125 lb (56.7 kg)   LMP 07/19/2000 (Approximate)   SpO2 96%   BMI 24.82 kg/m     General appearance: alert, cooperative and appears stated age   Pelvic: External genitalia:  No clitoral fissure.  The perineum has thin white tissue with mild erythema  at 7:00.               Urethra:  normal appearing urethra with no masses, tenderness or lesions              Chaperone was present for exam.  ASSESSMENT  Clitoral fissure, resolved.  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  Skin is responding to Valisone.  Urinary incontinence.  DES exposure.   Vaginal atrophy.  Hx DVT.  Not estrogen candidate.  Complex medical history.   PLAN  We discussed lichen sclerosus as her suspected diagnosis.  She declines a biopsy of the vulva today.  Reduce betamethasone to using twice a week at hs.  Fu in 6 weeks to see how she is doing on maintenance dosing.  Consider biopsy of the vulva then.  Reassurance regarding normal pap.    21 min  total time was spent for this [name] encounter, including preparation, face-to-face counseling with the [name], coordination of care, and documentation of the encounter.

## 2020-12-02 ENCOUNTER — Other Ambulatory Visit: Payer: Self-pay

## 2020-12-02 ENCOUNTER — Ambulatory Visit (INDEPENDENT_AMBULATORY_CARE_PROVIDER_SITE_OTHER): Payer: Medicare Other | Admitting: Obstetrics and Gynecology

## 2020-12-02 ENCOUNTER — Encounter: Payer: Self-pay | Admitting: Obstetrics and Gynecology

## 2020-12-02 VITALS — BP 130/60 | HR 91 | Ht 59.5 in | Wt 125.0 lb

## 2020-12-02 DIAGNOSIS — N762 Acute vulvitis: Secondary | ICD-10-CM

## 2020-12-02 NOTE — Patient Instructions (Addendum)
Please reduce your use of betamethasone ointment to twice a week to the vulva in thin layer two nights per week.   Lichen Sclerosus Lichen sclerosus is a skin problem. It can happen on any part of the body. It happens most often in the areas around the anus or the genitals. It can cause itching and discomfort. Treatment can help to control symptoms. It can also help prevent scarring that may lead to other problems. What are the causes? The cause of this condition is not known. It is not passed from one person to another. What increases the risk?  Being a woman who has reached menopause.  Being a man who was not circumcised.  Being a child who is about to reach puberty. What are the signs or symptoms? Symptoms of this condition include:  Thin, wrinkled, white areas on the skin.  Thickened white areas on the skin.  Red and swollen patches on the skin.  Tears or cracks in the skin.  Bruising.  Blood blisters.  Very bad itching.  Pain, itching, or burning when peeing (urinating). Children are also most likely to have trouble pooping (constipation). Some adults too can have trouble pooping.   How is this treated? This condition may be treated with:  Creams or ointments (topical steroids) that are put on the skin in the affected areas. This is the most common treatment.  Medicines that are taken by mouth.  Topical immunotherapy. This is putting creams and ointments on the affected area. The creams and ointments will make your immunity strong in order to fight the infection. This is needed if steroids have not helped.  Surgery. This is only needed if the condition is very bad and is causing problems such as scarring. Follow these instructions at home: Medicines  Take over-the-counter and prescription medicines only as told by your health care provider.  Use creams or ointments as told by your doctor. Skin care  Do not scratch the affected areas of skin.  If you are a  woman, keep the vagina as clean and dry as you can.  Clean the affected area of skin gently with water only. Avoid using rough towels or toilet paper.  Avoid products that irritate the skin. These include soap and scented lotions. Your doctor will tell you what creams to use to treat itching. General instructions  Keep all follow-up visits.  You may need to take these actions to prevent or treat trouble pooping: ? Drink enough fluid to keep your pee (urine) pale yellow. ? Take over-the-counter or prescription medicines. ? Eat foods that are high in fiber. These include beans, whole grains, and fresh fruits and vegetables. ? Limit foods that are high in fat and sugar. These include fried or sweet foods. Contact a doctor if:  Your redness, swelling, or pain gets worse.  You have fluid, blood, or pus coming from the area.  You have new red and swollen patches on your skin.  You have a fever.  You have pain during sex. Get help right away if:  You have very bad pain or burning in the affected areas, especially in the area around your vagina, penis, or anus. Summary  Lichen sclerosus is a skin problem. It can cause itching and discomfort.  This condition is usually treated with creams or ointments that are put on the skin in the affected areas.  Use medicines only as told by your doctor.  Do not scratch the affected areas of skin.  Keep all follow-up  visits. This information is not intended to replace advice given to you by your health care provider. Make sure you discuss any questions you have with your health care provider. Document Revised: 11/17/2019 Document Reviewed: 11/17/2019 Elsevier Patient Education  2021 [employer].

## 2020-12-03 ENCOUNTER — Ambulatory Visit: Payer: Medicare Other | Admitting: Obstetrics and Gynecology

## 2020-12-12 ENCOUNTER — Ambulatory Visit: Payer: Self-pay | Admitting: Adult Health

## 2020-12-12 ENCOUNTER — Other Ambulatory Visit: Payer: Self-pay | Admitting: *Deleted

## 2020-12-12 MED ORDER — METHYLPHENIDATE HCL ER (LA) 20 MG PO CP24: 20.0000 mg | ORAL_CAPSULE | Freq: Every day | ORAL | 0 refills | Status: DC

## 2020-12-12 NOTE — Telephone Encounter (Signed)
Patient requested refill Called [facility] to check Last Refill Date and spoke with pharmacist and He stated it was 11/12/2020.  Pended Rx and sent to [facility] for approval due to Carilyn Goodpasture out of office.

## 2020-12-16 ENCOUNTER — Ambulatory Visit: Payer: Self-pay | Admitting: Family Medicine

## 2020-12-17 ENCOUNTER — Other Ambulatory Visit: Payer: Self-pay

## 2020-12-17 ENCOUNTER — Encounter: Payer: Self-pay | Admitting: Family Medicine

## 2020-12-17 ENCOUNTER — Ambulatory Visit (INDEPENDENT_AMBULATORY_CARE_PROVIDER_SITE_OTHER): Payer: Medicare Other | Admitting: Family Medicine

## 2020-12-17 VITALS — BP 128/70 | HR 76 | Temp 97.3°F | Ht 59.5 in | Wt 127.4 lb

## 2020-12-17 DIAGNOSIS — Y92009 Unspecified place in unspecified non-institutional (private) residence as the place of occurrence of the external cause: Secondary | ICD-10-CM

## 2020-12-17 DIAGNOSIS — K219 Gastro-esophageal reflux disease without esophagitis: Secondary | ICD-10-CM | POA: Diagnosis not present

## 2020-12-17 DIAGNOSIS — W19XXXA Unspecified fall, initial encounter: Secondary | ICD-10-CM

## 2020-12-17 MED ORDER — ONDANSETRON HCL 4 MG PO TABS: 4.0000 mg | ORAL_TABLET | Freq: Three times a day (TID) | ORAL | 0 refills | Status: DC | PRN

## 2020-12-17 NOTE — Patient Instructions (Signed)
Get Senna-S  Begin with 2 tabs/day and increase every 2 days up to 8/day for constipation Please complete hemoccults as possible

## 2020-12-17 NOTE — Progress Notes (Signed)
Provider:  Jacalyn Lefevre, MD  Careteam: Patient Care Team: [name], Rebekah Citrin, NP as PCP - General (Family Medicine)  PLACE OF SERVICE:  [facility]  Advanced Directive information    Allergies  Allergen Reactions  . Amoxicillin-Pot Clavulanate Nausea And Vomiting and Other (See Comments)  . Clarithromycin Other (See Comments)    DOES NOT REMEMBER REACTION DOES NOT REMEMBER REACTION   . Fentanyl Nausea And Vomiting and Other (See Comments)    FENTANYL PATCH FENTANYL PATCH FENTANYL PATCH   . Levofloxacin Swelling    Face,eye swelling Face,eye swelling   . Lorazepam Other (See Comments)    DO NOT PUT ANY ATIVAN ON PATIENT'S IV POST SURGERY! DO NOT PUT ANY ATIVAN ON PATIENT'S IV POST SURGERY!   . Doxycycline Nausea And Vomiting  . Penicillins Hives  . Sulfa Antibiotics Other (See Comments)    Per pt: unknown    Chief Complaint  Patient presents with  . Acute Visit    Patient presents today for a fall she reports that occurred on Saturday, 12/13/2020. She denies going to the hospital. She reports concerns of internal bleeding due to her fall.      HPI: Patient is a 70 y.o. female as noted patient concerned after having a fall this past weekend that she is having internal bleeding.  This patient has many strange delusions and ideations.  She really has no history that would suggest bleeding.  No change in stools, no palpitations, no abdominal pain.  Fall seem to affect right knee but there is no deformity normal range of motion and normal ambulation.  Review of Systems:  Review of Systems  Respiratory: Negative.   Cardiovascular: Negative.   Gastrointestinal: Negative.   Neurological: Negative.   All other systems reviewed and are negative.   Past Medical History:  Diagnosis Date  . ADHD   . Anemia   . Cellulitis 07/2018   left lower extremity  . Chronic kidney disease    surgery to fix  . Chronic pain   . Depression    major depression  . DVT (deep  venous thrombosis) (HCC)   . Ekbom's delusional parasitosis (HCC)   . Fibromyalgia   . History of Zenker's diverticulum removal   . Lyme disease   . Migraine with aura   . Multiple falls   . Thyroid disease    hypothyroid   Past Surgical History:  Procedure Laterality Date  . APPENDECTOMY  1974  . Avascular necrosis of the hip     multiple operations starting in 2005.  bilateral hips  . Back fusion  1999  . CERVICAL FUSION  [id number]  . EYE SURGERY     eye muscle  . KIDNEY SURGERY  1971   kidney reconstruction  . LAMINECTOMY  1985  . TOTAL HIP ARTHROPLASTY     Social History:   reports that she has never smoked. She has never used smokeless tobacco. She reports previous alcohol use. She reports that she does not use drugs.  Family History  Problem Relation Age of Onset  . Cancer Sister   . Hemochromatosis Sister   . Cancer Mother        cancr of unknown origin  . Colon cancer Neg Hx   . Stomach cancer Neg Hx   . Esophageal cancer Neg Hx     Medications: Patient's Medications  New Prescriptions   No medications on file  Previous Medications   ASPIRIN 81 MG CHEWABLE TABLET  Chew 81 mg by mouth as needed.   BETAMETHASONE VALERATE OINTMENT (VALISONE) 0.1 %    Apply 1 application topically 2 (two) times daily. Use twice a day for 2 weeks.   BUPROPION (WELLBUTRIN XL) 150 MG 24 HR TABLET    Take 3 tablets by mouth daily.   BUPROPION HCL ER, XL, 450 MG TB24    Take 450 mg by mouth daily.   CHOLECALCIFEROL (VITAMIN D PO)    Take 1 capsule by mouth as needed.   CYCLOBENZAPRINE (FLEXERIL) 5 MG TABLET    Take 1 tablet (5 mg total) by mouth daily as needed for muscle spasms. Take 1 tablet daily and as needed for muscle spasms   DICLOFENAC SODIUM CR 100 MG 24 HR TABLET    Take 1 tablet (100 mg total) by mouth daily. With food   ESCITALOPRAM (LEXAPRO) 20 MG TABLET    Take 2 tablets (40 mg total) by mouth daily.   KETOCONAZOLE (NIZORAL) 2 % SHAMPOO    Apply 1 application  topically 2 (two) times a week.   METHOCARBAMOL (ROBAXIN) 500 MG TABLET    Take 1 tablet (500 mg total) by mouth as needed for muscle spasms. 1-2 tablets PRN   METHYLPHENIDATE (RITALIN LA) 20 MG 24 HR CAPSULE    Take 1 capsule (20 mg total) by mouth daily.   RITALIN 5 MG TABLET    Take 1 tablet (5 mg total) by mouth daily as needed.   RIVAROXABAN 15 & 20 MG TBPK    Take as directed on package: Start with one 15mg  tablet by mouth twice a day with food. On Day 22, switch to one 20mg  tablet once a day with food.   THYROID (NP THYROID) 60 MG TABLET    Take 1 tablet (60 mg total) by mouth daily before breakfast.   TOPIRAMATE (TOPAMAX) 100 MG TABLET    Take 1 tablet (100 mg total) by mouth every morning.  Modified Medications   No medications on file  Discontinued Medications   No medications on file    Physical Exam:  Vitals:   12/17/20 1006  Weight: 127 lb 6.4 oz (57.8 kg)   Body mass index is 25.3 kg/m. Wt Readings from Last 3 Encounters:  12/17/20 127 lb 6.4 oz (57.8 kg)  12/02/20 125 lb (56.7 kg)  11/24/20 129 lb (58.5 kg)    Physical Exam Vitals and nursing note reviewed.  Cardiovascular:     Rate and Rhythm: Normal rate and regular rhythm.  Pulmonary:     Breath sounds: Normal breath sounds.  Abdominal:     General: Abdomen is flat. Bowel sounds are normal.     Tenderness: There is no abdominal tenderness. There is no guarding.  Neurological:     General: No focal deficit present.     Mental Status: She is alert and oriented to person, place, and time.  Psychiatric:     Comments: Thought content is strange indeed.  She has multiple symptoms that do not make physiologic sense     Labs reviewed: Basic Metabolic Panel: Recent Labs    09/12/20 1716 11/17/20 1209  NA 139 139  K 3.6 4.2  CL 106 106  CO2 23 20  GLUCOSE 88 76  BUN 20 17  CREATININE 1.01* 0.86  CALCIUM 9.0 9.8  TSH  --  1.51   Liver Function Tests: Recent Labs    11/17/20 1209  AST 22  ALT 17   BILITOT 0.6  PROT 7.1  No results for input(s): LIPASE, AMYLASE in the last 8760 hours. No results for input(s): AMMONIA in the last 8760 hours. CBC: Recent Labs    09/12/20 1716 11/17/20 1209  WBC 7.6 5.0  NEUTROABS  --  2,825  HGB 13.1 14.2  HCT 40.9 44.2  MCV 90.9 88.9  PLT 272 361   Lipid Panel: Recent Labs    11/17/20 1209  CHOL 164  HDL 78  LDLCALC 72  TRIG 49  CHOLHDL 2.1   TSH: Recent Labs    11/17/20 1209  TSH 1.51   A1C: No results found for: HGBA1C   Assessment/Plan  1. Gastroesophageal reflux disease, unspecified whether esophagitis present W eating e will check hemoglobin to rule out some bleeding.  Really see no clinical evidence that there has been blood - CBC (no diff) - ondansetron (ZOFRAN) 4 MG tablet; Take 1 tablet (4 mg total) by mouth every 8 (eight) hours as needed for nausea or vomiting.  Dispense: 20 tablet; Refill: 0  2. Fall in home, initial encounter Exam shows no orthopedic abnormality especially right knee where she seems a fall was most impacted.  Again Hemoccults are to rule out internal bleeding which patient is worried about - Fecal Globin By Immunochemistry; Future     Rebekah Lefevre, MD [facility] [phone]

## 2020-12-19 ENCOUNTER — Telehealth: Payer: Self-pay | Admitting: *Deleted

## 2020-12-19 ENCOUNTER — Other Ambulatory Visit: Payer: Medicare Other

## 2020-12-19 ENCOUNTER — Other Ambulatory Visit: Payer: Self-pay

## 2020-12-19 LAB — CBC
HCT: 41 % (ref 35.0–45.0)
Hemoglobin: 13.4 g/dL (ref 11.7–15.5)
MCH: 29.3 pg (ref 27.0–33.0)
MCHC: 32.7 g/dL (ref 32.0–36.0)
MCV: 89.5 fL (ref 80.0–100.0)
MPV: 10.3 fL (ref 7.5–12.5)
Platelets: 278 10*3/uL (ref 140–400)
RBC: 4.58 10*6/uL (ref 3.80–5.10)
RDW: 13 % (ref 11.0–15.0)
WBC: 5.7 10*3/uL (ref 3.8–10.8)

## 2020-12-19 NOTE — Telephone Encounter (Signed)
Patient called and stated that she has had some blood in her stool this morning. Stated it was not a lot. No other symptoms.  Dr. Hyacinth Meeker gave her Hemoccult to do at last OV, 12/17/2020, and she stated that she will get those done.  She is coming in today to have labwork done.   No available appointments for today.   Please Advise. (Forwarded to [place], Dr. Hyacinth Meeker out of office.)

## 2020-12-19 NOTE — Telephone Encounter (Signed)
Patient notified and agreed.  

## 2020-12-19 NOTE — Telephone Encounter (Signed)
If it was minimal will see what her labs look like. If she experiences this again with any other symptoms such as shortness of breath, chest pains, palpitations, light headedness or dizziness would recommend going to the ED

## 2020-12-22 ENCOUNTER — Ambulatory Visit: Payer: Medicare Other | Admitting: Internal Medicine

## 2020-12-24 ENCOUNTER — Ambulatory Visit (INDEPENDENT_AMBULATORY_CARE_PROVIDER_SITE_OTHER): Payer: Medicare Other | Admitting: Internal Medicine

## 2020-12-24 ENCOUNTER — Other Ambulatory Visit: Payer: Self-pay

## 2020-12-24 ENCOUNTER — Encounter: Payer: Self-pay | Admitting: Internal Medicine

## 2020-12-24 VITALS — BP 125/75 | HR 85 | Temp 97.6°F | Resp 16 | Ht 59.5 in | Wt 126.7 lb

## 2020-12-24 DIAGNOSIS — F22 Delusional disorders: Secondary | ICD-10-CM | POA: Diagnosis present

## 2020-12-24 NOTE — Addendum Note (Signed)
Addended by: Harley Alto on: 12/24/2020 10:08 AM   Modules accepted: Orders

## 2020-12-24 NOTE — Patient Instructions (Signed)
We can check basic labs to screen for common parasite infection   I do not believe you have any parasitic infection. You are welcome to seek second opinion.  If your labs are of concern/suggestion of worm infection, our clinic will call you for follow up evaluation
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
## 2020-12-30 ENCOUNTER — Other Ambulatory Visit: Payer: Medicare Other

## 2020-12-30 ENCOUNTER — Other Ambulatory Visit: Payer: Self-pay

## 2020-12-30 ENCOUNTER — Telehealth: Payer: Self-pay

## 2020-12-30 DIAGNOSIS — F22 Delusional disorders: Secondary | ICD-10-CM

## 2020-12-30 DIAGNOSIS — W19XXXA Unspecified fall, initial encounter: Secondary | ICD-10-CM

## 2020-12-30 DIAGNOSIS — Y92009 Unspecified place in unspecified non-institutional (private) residence as the place of occurrence of the external cause: Secondary | ICD-10-CM

## 2020-12-30 LAB — COMPLETE METABOLIC PANEL WITH GFR
AG Ratio: 1.8 (calc) (ref 1.0–2.5)
ALT: 13 U/L (ref 6–29)
AST: 17 U/L (ref 10–35)
Albumin: 4.5 g/dL (ref 3.6–5.1)
Alkaline phosphatase (APISO): 80 U/L (ref 37–153)
BUN: 18 mg/dL (ref 7–25)
CO2: 27 mmol/L (ref 20–32)
Calcium: 9.7 mg/dL (ref 8.6–10.4)
Chloride: 104 mmol/L (ref 98–110)
Creat: 0.86 mg/dL (ref 0.60–0.93)
GFR, Est African American: 79 mL/min/{1.73_m2} (ref >=60)
GFR, Est Non African American: 68 mL/min/{1.73_m2} (ref >=60)
Globulin: 2.5 g/dL (calc) (ref 1.9–3.7)
Glucose, Bld: 104 mg/dL — ABNORMAL HIGH (ref 65–99)
Potassium: 4.5 mmol/L (ref 3.5–5.3)
Sodium: 139 mmol/L (ref 135–146)
Total Bilirubin: 0.5 mg/dL (ref 0.2–1.2)
Total Protein: 7 g/dL (ref 6.1–8.1)

## 2020-12-30 LAB — CBC WITH DIFFERENTIAL/PLATELET
Absolute Monocytes: 530 cells/uL (ref 200–950)
Basophils Absolute: 42 cells/uL (ref 0–200)
Basophils Relative: 0.8 %
Eosinophils Absolute: 228 cells/uL (ref 15–500)
Eosinophils Relative: 4.3 %
HCT: 44.7 % (ref 35.0–45.0)
Hemoglobin: 14.3 g/dL (ref 11.7–15.5)
Lymphs Abs: 864 cells/uL (ref 850–3900)
MCH: 28.5 pg (ref 27.0–33.0)
MCHC: 32 g/dL (ref 32.0–36.0)
MCV: 89 fL (ref 80.0–100.0)
MPV: 10.3 fL (ref 7.5–12.5)
Monocytes Relative: 10 %
Neutro Abs: 3636 cells/uL (ref 1500–7800)
Neutrophils Relative %: 68.6 %
Platelets: 299 10*3/uL (ref 140–400)
RBC: 5.02 10*6/uL (ref 3.80–5.10)
RDW: 13.2 % (ref 11.0–15.0)
Total Lymphocyte: 16.3 %
WBC: 5.3 10*3/uL (ref 3.8–10.8)

## 2020-12-30 LAB — STRONGYLOIDES ANTIBODY: Strongyloides IgG Antibody, ELISA: NEGATIVE

## 2020-12-30 LAB — E. HISTOLYTICA ANTIBODY (AMOEBA AB): E histolytica Ab: NEGATIVE

## 2020-12-30 NOTE — Telephone Encounter (Signed)
Patient provided stool sample in the lab, lab made staff aware that sample was not sufficient   due to no liquid in sample tube. Patient was provided with new sample kit and instructions given for collecting sample.  [name]

## 2020-12-31 ENCOUNTER — Telehealth: Payer: Self-pay

## 2020-12-31 NOTE — Telephone Encounter (Signed)
Patient called, she is requesting to see a different provider as she feels Dr. Renold Don "did not "know what he was talking about" in regards to her concerns of schistosomiasis. She states that she has an "advanced stage" and that the provider was only asking questions that pertained to early stage disease. RN assured patient that Dr. Renold Don is knowledgeable about the disease process.   RN advised patient that our office is still waiting on an adequate stool sample from her. Instructed that she should wait for the results of the stool sample and that next steps can be decided then. Patient verbalized understanding and has no further questions.   Sandie Ano, RN

## 2020-12-31 NOTE — Telephone Encounter (Signed)
Patient came to front desk yesterday and asked Karie Kirks if she could speak only to me. I called her and left a message requesting that she await my call back as I am in clinic or try to call and ask for triage as we have many clinical team members who can help her. Will try to call her again after morning clinic.

## 2021-01-01 ENCOUNTER — Ambulatory Visit (INDEPENDENT_AMBULATORY_CARE_PROVIDER_SITE_OTHER): Payer: Medicare Other | Admitting: Family

## 2021-01-01 ENCOUNTER — Other Ambulatory Visit: Payer: Self-pay

## 2021-01-01 ENCOUNTER — Encounter: Payer: Self-pay | Admitting: Family

## 2021-01-01 VITALS — BP 140/90 | HR 88 | Temp 97.5°F | Resp 16 | Ht 59.5 in | Wt 127.2 lb

## 2021-01-01 DIAGNOSIS — M542 Cervicalgia: Secondary | ICD-10-CM | POA: Diagnosis not present

## 2021-01-01 DIAGNOSIS — R200 Anesthesia of skin: Secondary | ICD-10-CM

## 2021-01-01 DIAGNOSIS — N3281 Overactive bladder: Secondary | ICD-10-CM | POA: Diagnosis not present

## 2021-01-01 DIAGNOSIS — G8929 Other chronic pain: Secondary | ICD-10-CM

## 2021-01-01 NOTE — Patient Instructions (Signed)
-   Referral ordered for urologist for overactive bladder and Nephrology to evaluate neck pain and numbness of left thumb and middle finger.Specialist office will call you for appointment.

## 2021-01-01 NOTE — Progress Notes (Signed)
Provider: Richarda Blade FNP-C  Micah Barnier, Donalee Citrin, NP  Patient Care Team: Marcel Sorter, Donalee Citrin, NP as PCP - General (Family Medicine)  Extended Emergency Contact Information Primary Emergency Contact: Juanda Chance [place] Mobile Phone: [phone] Relation: Son Secondary Emergency Contact: Birder Robson III Address: [address]          [place], [place] [address] D[place] of [place] Home Phone: [phone] Mobile Phone: [phone] Relation: Spouse  Code Status:  Full Code  Goals of care: Advanced Directive information Advanced Directives 01/01/2021  Does Patient Have a Medical Advance Directive? No  Does patient want to make changes to medical advance directive? No - Patient declined  Would patient like information on creating a medical advance directive? -     Chief Complaint  Patient presents with   Acute Visit    Patient wants to discuss stool as well as note stating her PCP/pain level.    HPI:  Pt is a 70 y.o. female seen today for an acute visit for evaluation of over active bladder and neck pain,numbness of left thumb and middle finger numbness.symptoms ongoing. Also complains of worsening over active bladder would like referral to urologist.  Continue to reports parasite infestation under her skin and in the stool.she was referred to Infectious disease but would like to see another ID.   Past Medical History:  Diagnosis Date   ADHD    Anemia    Cellulitis 07/2018   left lower extremity   Chronic kidney disease    surgery to fix   Chronic pain    Depression    major depression   DVT (deep venous thrombosis) (HCC)    Ekbom's delusional parasitosis (HCC)    Fibromyalgia    History of Zenker's diverticulum removal    Lyme disease    Migraine with aura    Multiple falls    Thyroid disease    hypothyroid   Past Surgical History:  Procedure Laterality Date   APPENDECTOMY  1974   Avascular necrosis of the hip     multiple  operations starting in 2005.  bilateral hips   Back fusion  1999   CERVICAL FUSION  9417,4081   EYE SURGERY     eye muscle   KIDNEY SURGERY  1971   kidney reconstruction   LAMINECTOMY  1985   TOTAL HIP ARTHROPLASTY      Allergies  Allergen Reactions   Amoxicillin-Pot Clavulanate Nausea And Vomiting and Other (See Comments)   Clarithromycin Other (See Comments)    DOES NOT REMEMBER REACTION DOES NOT REMEMBER REACTION    Fentanyl Nausea And Vomiting and Other (See Comments)    FENTANYL PATCH FENTANYL PATCH FENTANYL PATCH    Levofloxacin Swelling    Face,eye swelling Face,eye swelling    Lorazepam Other (See Comments)    DO NOT PUT ANY ATIVAN ON PATIENT'S IV POST SURGERY! DO NOT PUT ANY ATIVAN ON PATIENT'S IV POST SURGERY!    Doxycycline Nausea And Vomiting   Penicillins Hives   Sulfa Antibiotics Other (See Comments)    Per pt: unknown    Outpatient Encounter Medications as of 01/01/2021  Medication Sig   betamethasone valerate ointment (VALISONE) 0.1 % Apply 1 application topically 2 (two) times daily. Use twice a day for 2 weeks.   buPROPion HCl ER, XL, 450 MG TB24 Take 450 mg by mouth daily.   cyclobenzaprine (FLEXERIL) 5 MG tablet Take 1 tablet (5 mg total) by mouth daily as needed for muscle spasms.  Take 1 tablet daily and as needed for muscle spasms   Diclofenac Sodium CR 100 MG 24 hr tablet Take 1 tablet (100 mg total) by mouth daily. With food   methocarbamol (ROBAXIN) 500 MG tablet Take 1 tablet (500 mg total) by mouth as needed for muscle spasms. 1-2 tablets PRN   methylphenidate (RITALIN LA) 20 MG 24 hr capsule Take 1 capsule (20 mg total) by mouth daily.   ondansetron (ZOFRAN) 4 MG tablet Take 1 tablet (4 mg total) by mouth every 8 (eight) hours as needed for nausea or vomiting.   RITALIN 5 MG tablet Take 1 tablet (5 mg total) by mouth daily as needed.   thyroid (NP THYROID) 60 MG tablet Take 1 tablet (60 mg total) by mouth daily before breakfast.   topiramate  (TOPAMAX) 100 MG tablet Take 100 mg by mouth as needed.   escitalopram (LEXAPRO) 20 MG tablet Take 2 tablets (40 mg total) by mouth daily.   [DISCONTINUED] aspirin 81 MG chewable tablet Chew 81 mg by mouth as needed. (Patient not taking: Reported on 12/24/2020)   [DISCONTINUED] buPROPion (WELLBUTRIN XL) 150 MG 24 hr tablet Take 3 tablets by mouth daily. (Patient not taking: Reported on 12/24/2020)   [DISCONTINUED] Cholecalciferol (VITAMIN D PO) Take 1 capsule by mouth as needed. (Patient not taking: No sig reported)   [DISCONTINUED] ketoconazole (NIZORAL) 2 % shampoo Apply 1 application topically 2 (two) times a week. (Patient not taking: No sig reported)   [DISCONTINUED] Rivaroxaban 15 & 20 MG TBPK Take as directed on package: Start with one 15mg  tablet by mouth twice a day with food. On Day 22, switch to one 20mg  tablet once a day with food. (Patient not taking: No sig reported)   [DISCONTINUED] topiramate (TOPAMAX) 100 MG tablet Take 1 tablet (100 mg total) by mouth every morning. (Patient taking differently: Take 100 mg by mouth as needed.)   No facility-administered encounter medications on file as of 01/01/2021.    Review of Systems  Constitutional:  Negative for appetite change, chills, fatigue, fever and unexpected weight change.  HENT:  Negative for congestion, dental problem, ear discharge, ear pain, facial swelling, hearing loss, nosebleeds, postnasal drip, rhinorrhea, sinus pressure, sinus pain, sneezing, sore throat, tinnitus and trouble swallowing.   Eyes:  Negative for pain, discharge, redness, itching and visual disturbance.  Respiratory:  Negative for cough, chest tightness, shortness of breath and wheezing.   Cardiovascular:  Negative for chest pain, palpitations and leg swelling.  Gastrointestinal:  Negative for abdominal distention, abdominal pain, blood in stool, constipation, diarrhea, nausea and vomiting.  Endocrine: Negative for cold intolerance, heat intolerance, polydipsia,  polyphagia and polyuria.  Genitourinary:  Negative for difficulty urinating, dysuria, flank pain, frequency and urgency.  Musculoskeletal:  Positive for arthralgias, back pain and gait problem. Negative for joint swelling, myalgias, neck pain and neck stiffness.  Skin:  Negative for color change, pallor, rash and wound.  Neurological:  Negative for dizziness, syncope, speech difficulty, weakness, light-headedness, numbness and headaches.  Hematological:  Does not bruise/bleed easily.  Psychiatric/Behavioral:  Negative for agitation, behavioral problems, confusion, hallucinations, self-injury, sleep disturbance and suicidal ideas. The patient is not nervous/anxious.        ADHD   Immunization History  Administered Date(s) Administered   Influenza, High Dose Seasonal PF 06/09/2018, 09/07/2019   PFIZER(Purple Top)SARS-COV-2 Vaccination 11/19/2019, 02/12/2020   Pneumococcal Conjugate-13 08/22/2015   Pneumococcal Polysaccharide-23 11/10/2020   Pertinent  Health Maintenance Due  Topic Date Due   MAMMOGRAM  09/08/2013  DEXA SCAN  Never done   INFLUENZA VACCINE  02/16/2021   COLONOSCOPY (Pts 45-63yrs Insurance coverage will need to be confirmed)  12/01/2021   PNA vac Low Risk Adult  Completed   Fall Risk  01/01/2021 12/24/2020 11/24/2020 11/10/2020  Falls in the past year? 0 1 0 0  Comment - pain in R hip from fall last week. - -  Number falls in past yr: 0 1 0 0  Injury with Fall? 0 1 0 0  Risk for fall due to : No Fall Risks - - -   Functional Status Survey:    Vitals:   01/01/21 1004  BP: 140/90  Pulse: 88  Resp: 16  Temp: (!) 97.5 F (36.4 C)  SpO2: 97%  Weight: 127 lb 3.2 oz (57.7 kg)  Height: 4' 11.5" (1.511 m)   Body mass index is 25.26 kg/m. Physical Exam Vitals reviewed.  Constitutional:      General: She is not in acute distress.    Appearance: Normal appearance. She is normal weight. She is not ill-appearing or diaphoretic.  HENT:     Head: Normocephalic.      Mouth/Throat:     Mouth: Mucous membranes are moist.     Pharynx: Oropharynx is clear. No oropharyngeal exudate or posterior oropharyngeal erythema.  Eyes:     General: No scleral icterus.       Right eye: No discharge.        Left eye: No discharge.     Extraocular Movements: Extraocular movements intact.     Conjunctiva/sclera: Conjunctivae normal.     Pupils: Pupils are equal, round, and reactive to light.  Neck:     Vascular: No carotid bruit.  Cardiovascular:     Rate and Rhythm: Normal rate and regular rhythm.     Pulses: Normal pulses.     Heart sounds: Normal heart sounds. No murmur heard.   No friction rub. No gallop.  Pulmonary:     Effort: Pulmonary effort is normal. No respiratory distress.     Breath sounds: Normal breath sounds. No wheezing, rhonchi or rales.  Chest:     Chest wall: No tenderness.  Abdominal:     General: Bowel sounds are normal. There is no distension.     Palpations: Abdomen is soft. There is no mass.     Tenderness: There is no abdominal tenderness. There is no right CVA tenderness, left CVA tenderness, guarding or rebound.  Musculoskeletal:        General: No swelling or tenderness.     Cervical back: Normal range of motion. No rigidity or tenderness.     Thoracic back: Scoliosis present.     Right lower leg: No edema.     Left lower leg: No edema.     Comments: Unsteady gait   Lymphadenopathy:     Cervical: No cervical adenopathy.  Skin:    General: Skin is warm and dry.     Coloration: Skin is not pale.     Findings: No bruising, erythema, lesion or rash.  Neurological:     Mental Status: She is alert and oriented to person, place, and time.     Cranial Nerves: No cranial nerve deficit.     Sensory: No sensory deficit.     Motor: No weakness.     Coordination: Coordination normal.     Gait: Gait abnormal.  Psychiatric:        Mood and Affect: Mood normal.  Speech: Speech normal.        Behavior: Behavior normal.         Thought Content: Thought content normal.        Judgment: Judgment normal.    Labs reviewed: Recent Labs    09/12/20 1716 11/17/20 1209 12/24/20 1019  NA 139 139 139  K 3.6 4.2 4.5  CL 106 106 104  CO2 23 20 27   GLUCOSE 88 76 104*  BUN 20 17 18   CREATININE 1.01* 0.86 0.86  CALCIUM 9.0 9.8 9.7   Recent Labs    11/17/20 1209 12/24/20 1019  AST 22 17  ALT 17 13  BILITOT 0.6 0.5  PROT 7.1 7.0   Recent Labs    11/17/20 1209 12/19/20 1337 12/24/20 1019  WBC 5.0 5.7 5.3  NEUTROABS 2,825  --  3,636  HGB 14.2 13.4 14.3  HCT 44.2 41.0 44.7  MCV 88.9 89.5 89.0  PLT 361 278 299   Lab Results  Component Value Date   TSH 1.51 11/17/2020   No results found for: HGBA1C Lab Results  Component Value Date   CHOL 164 11/17/2020   HDL 78 11/17/2020   LDLCALC 72 11/17/2020   TRIG 49 11/17/2020   CHOLHDL 2.1 11/17/2020    Significant Diagnostic Results in last 30 days:  No results found.  Assessment/Plan 1. Overactive bladder Request referral to urologist  - Ambulatory referral to Urology  2. Numbness of finger Worsening on thumb,and middle finger - Ambulatory referral to Neurology  3. Neck pain, chronic Continue on flexeril - Ambulatory referral to Neurology  Family/ staff Communication: Reviewed plan of care with patient verbalized understanding.  Labs/tests ordered: None   Next Appointment: As needed if symptoms worsen or fail to improve    01/17/2021, NP

## 2021-01-05 NOTE — Progress Notes (Deleted)
GYNECOLOGY  VISIT   HPI: 70 y.o.   Legally Separated  Caucasian  female   No obstetric history on file. with Patient's last menstrual period was 07/19/2000 (approximate).   here for follow up.  GYNECOLOGIC HISTORY: Patient's last menstrual period was 07/19/2000 (approximate). Contraception:  PMP Menopausal hormone therapy:  none Last mammogram: 09-14-19 Neg/BiRads1 Last pap smear: 11/18/20 Neg, 08-31-11 Neg:Neg HR HPV         OB History   No obstetric history on file.        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   Allergic rhinitis 03/26/2011   Attention deficit hyperactivity disorder (ADHD) 03/26/2011   Acquired hypothyroidism 03/26/2011   Depression 03/26/2011   Fibromyalgia 03/26/2011   History of deep venous thrombosis 03/26/2011   History of migraine 03/26/2011   Other mechanical complication of prosthetic joint implant 03/26/2011   Gastroesophageal reflux disease 03/26/2011   Zenker's diverticulum 03/26/2011    Past Medical History:  Diagnosis Date   ADHD    Anemia    Cellulitis 07/2018   left lower extremity   Chronic kidney disease    surgery to fix   Chronic pain    Depression    major depression   DVT (deep venous thrombosis) (HCC)    Ekbom's delusional parasitosis (HCC)    Fibromyalgia    History of Zenker's diverticulum removal    Lyme disease    Migraine with aura    Multiple falls    Thyroid disease     hypothyroid    Past Surgical History:  Procedure Laterality Date   APPENDECTOMY  1974   Avascular necrosis of the hip     multiple operations starting in 2005.  bilateral hips   Back fusion  1999   CERVICAL FUSION  4008,6761   EYE SURGERY     eye muscle   KIDNEY SURGERY  1971   kidney reconstruction   LAMINECTOMY  1985   TOTAL HIP ARTHROPLASTY      Current Outpatient Medications  Medication Sig Dispense Refill   betamethasone valerate ointment (VALISONE) 0.1 % Apply 1 application topically 2 (two) times daily. Use twice a day for 2 weeks. 45 g 0   buPROPion HCl ER, XL, 450 MG TB24 Take 450 mg by mouth daily. 30 tablet 3   cyclobenzaprine (FLEXERIL) 5 MG tablet Take 1 tablet (5 mg total) by mouth daily as needed for muscle spasms. Take 1 tablet daily and as needed for muscle spasms 30 tablet 5   Diclofenac Sodium CR 100 MG 24 hr tablet Take 1 tablet (100 mg total) by mouth daily. With food 30 tablet 3   escitalopram (LEXAPRO) 20 MG tablet Take 2 tablets (40 mg total) by mouth daily. 30 tablet 3   methocarbamol (ROBAXIN) 500 MG tablet Take 1 tablet (500 mg total) by mouth as needed for muscle spasms. 1-2 tablets PRN 30 tablet 3   methylphenidate (RITALIN LA) 20 MG  24 hr capsule Take 1 capsule (20 mg total) by mouth daily. 30 capsule 0   ondansetron (ZOFRAN) 4 MG tablet Take 1 tablet (4 mg total) by mouth every 8 (eight) hours as needed for nausea or vomiting. 20 tablet 0   RITALIN 5 MG tablet Take 1 tablet (5 mg total) by mouth daily as needed. 30 tablet 0   thyroid (NP THYROID) 60 MG tablet Take 1 tablet (60 mg total) by mouth daily before breakfast. 30 tablet 3   topiramate (TOPAMAX) 100 MG tablet Take 100 mg by mouth as needed.     No current facility-administered medications for this visit.     ALLERGIES: Amoxicillin-pot clavulanate, Clarithromycin, Fentanyl, Levofloxacin, Lorazepam, Doxycycline, Penicillins, and Sulfa antibiotics  Family History  Problem Relation Age of  Onset   Cancer Sister    Hemochromatosis Sister    Cancer Mother        cancr of unknown origin   Colon cancer Neg Hx    Stomach cancer Neg Hx    Esophageal cancer Neg Hx     Social History   Socioeconomic History   Marital status: Legally Separated    Spouse name: Not on file   Number of children: Not on file   Years of education: Not on file   Highest education level: Not on file  Occupational History   Not on file  Tobacco Use   Smoking status: Never   Smokeless tobacco: Never  Vaping Use   Vaping Use: Never used  Substance and Sexual Activity   Alcohol use: Not Currently    Comment: rarely   Drug use: No   Sexual activity: Not Currently    Birth control/protection: Post-menopausal  Other Topics Concern   Not on file  Social History Narrative   Clinical Psychologist--Dennis McKnight---[phone]      Tobacco use, amount per day now: No   Past tobacco use, amount per day: N/A   How many years did you use tobacco: Never    Alcohol use (drinks per week): Very rarely   Diet: Very health eating   Do you drink/eat things with caffeine: Very rarely   Marital status: Seperated                                 What year were you married? 1991   Do you live in a house, apartment, assisted living, condo, trailer, etc.? House   Is it one or more stories? 2   How many persons live in your home? 3 ( including me )   Do you have pets in your home?( please list)   Highest Level of education completed? Advanced Degree   Current or past profession: Many   Do you exercise? Not currently due to injury.                                 Type and how often?   Do you have a living will? No.   Do you have a DNR form? No.                                  If not, do you want to discuss one?   Do you have signed POA/HPOA forms? No.  If so, please bring to you appointment      Do you have any difficulty bathing or dressing yourself? No.   Do you have any difficulty  preparing food or eating? No.   Do you have any difficulty managing your medications? No.   Do you have any difficulty managing your finances? No.   Do you have any difficulty affording your medications? No.   Social Determinants of Health   Financial Resource Strain: Not on file  Food Insecurity: Not on file  Transportation Needs: Not on file  Physical Activity: Not on file  Stress: Not on file  Social Connections: Not on file  Intimate Partner Violence: Not on file    Review of Systems  PHYSICAL EXAMINATION:    LMP 07/19/2000 (Approximate)     General appearance: alert, cooperative and appears stated age Head: Normocephalic, without obvious abnormality, atraumatic Neck: no adenopathy, supple, symmetrical, trachea midline and thyroid normal to inspection and palpation Lungs: clear to auscultation bilaterally Breasts: normal appearance, no masses or tenderness, No nipple retraction or dimpling, No nipple discharge or bleeding, No axillary or supraclavicular adenopathy Heart: regular rate and rhythm Abdomen: soft, non-tender, no masses,  no organomegaly Extremities: extremities normal, atraumatic, no cyanosis or edema Skin: Skin color, texture, turgor normal. No rashes or lesions Lymph nodes: Cervical, supraclavicular, and axillary nodes normal. No abnormal inguinal nodes palpated Neurologic: Grossly normal  Pelvic: External genitalia:  no lesions              Urethra:  normal appearing urethra with no masses, tenderness or lesions              Bartholins and Skenes: normal                 Vagina: normal appearing vagina with normal color and discharge, no lesions              Cervix: no lesions                Bimanual Exam:  Uterus:  normal size, contour, position, consistency, mobility, non-tender              Adnexa: no mass, fullness, tenderness              Rectal exam: {yes no:[id number]}.  Confirms.              Anus:  normal sphincter tone, no lesions  Chaperone was  present for exam.  ASSESSMENT     PLAN     An After Visit Summary was printed and given to the patient.  ______ minutes face to face time of which over 50% was spent in counseling.

## 2021-01-06 ENCOUNTER — Ambulatory Visit: Payer: Medicare Other | Admitting: Obstetrics and Gynecology

## 2021-01-06 DIAGNOSIS — Z0289 Encounter for other administrative examinations: Secondary | ICD-10-CM

## 2021-01-09 ENCOUNTER — Other Ambulatory Visit: Payer: Self-pay | Admitting: *Deleted

## 2021-01-09 MED ORDER — METHYLPHENIDATE HCL ER (LA) 20 MG PO CP24: 20.0000 mg | ORAL_CAPSULE | Freq: Every day | ORAL | 0 refills | Status: DC

## 2021-01-09 NOTE — Telephone Encounter (Signed)
Pharmacy requested refill Epic LR: 12/12/2020 Pended Rx and sent to [facility] for approval.

## 2021-01-12 ENCOUNTER — Other Ambulatory Visit: Payer: Medicare Other

## 2021-01-12 ENCOUNTER — Other Ambulatory Visit: Payer: Self-pay

## 2021-01-12 ENCOUNTER — Ambulatory Visit: Payer: Medicare Other | Admitting: Internal Medicine

## 2021-01-12 ENCOUNTER — Ambulatory Visit: Payer: Medicare Other | Admitting: Obstetrics and Gynecology

## 2021-01-12 DIAGNOSIS — F22 Delusional disorders: Secondary | ICD-10-CM

## 2021-01-15 LAB — OVA AND PARASITE EXAMINATION
CONCENTRATE RESULT:: NONE SEEN
MICRO NUMBER:: 12054406
SPECIMEN QUALITY:: ADEQUATE
TRICHROME RESULT:: NONE SEEN

## 2021-01-16 ENCOUNTER — Ambulatory Visit (INDEPENDENT_AMBULATORY_CARE_PROVIDER_SITE_OTHER): Payer: Medicare Other | Admitting: Obstetrics and Gynecology

## 2021-01-16 ENCOUNTER — Other Ambulatory Visit: Payer: Self-pay

## 2021-01-16 ENCOUNTER — Encounter: Payer: Self-pay | Admitting: Obstetrics and Gynecology

## 2021-01-16 VITALS — BP 130/82

## 2021-01-16 DIAGNOSIS — R8290 Unspecified abnormal findings in urine: Secondary | ICD-10-CM

## 2021-01-16 DIAGNOSIS — N763 Subacute and chronic vulvitis: Secondary | ICD-10-CM

## 2021-01-16 LAB — URINALYSIS, COMPLETE W/RFL CULTURE
Bacteria, UA: NONE SEEN /HPF
Bilirubin Urine: NEGATIVE
Glucose, UA: NEGATIVE
Hgb urine dipstick: NEGATIVE
Hyaline Cast: NONE SEEN /LPF
Ketones, ur: NEGATIVE
Leukocyte Esterase: NEGATIVE
Nitrites, Initial: NEGATIVE
Protein, ur: NEGATIVE
RBC / HPF: NONE SEEN /HPF (ref 0–2)
Specific Gravity, Urine: 1.025 (ref 1.001–1.035)
WBC, UA: NONE SEEN /HPF (ref 0–5)
pH: 5 (ref 5.0–8.0)

## 2021-01-16 LAB — NO CULTURE INDICATED

## 2021-01-16 MED ORDER — BETAMETHASONE VALERATE 0.1 % EX OINT: 1.0000 "application " | TOPICAL_OINTMENT | Freq: Two times a day (BID) | CUTANEOUS | 1 refills | Status: DC

## 2021-01-16 NOTE — Progress Notes (Signed)
GYNECOLOGY  VISIT   HPI: 70 y.o.   Legally Separated Caucasian female   No obstetric history on file. with Patient's last menstrual period was 07/19/2000 (approximate).   here for   follow up vulvitis check  She has been using Valisone ointment, but has misplaced it.  Thinks it is helping.   She is concerned about the appearance of her urine and that she has worms.  She shows me a picture of urine fro her cell phone.  She states she will try to give a urine sample today.  She is waiting for a urology consult.   Wants to take medication to increase her sexual desire.   GYNECOLOGIC HISTORY: Patient's last menstrual period was 07/19/2000 (approximate). Contraception:  none Menopausal hormone therapy:  none Last mammogram:  7 2021 Last pap smear:   11-18-20 - normal.        OB History   No obstetric history on file.        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   Allergic rhinitis 03/26/2011   Attention deficit hyperactivity disorder (ADHD) 03/26/2011   Acquired hypothyroidism 03/26/2011   Depression 03/26/2011   Fibromyalgia 03/26/2011   History of deep venous thrombosis 03/26/2011   History of migraine 03/26/2011   Other mechanical complication of prosthetic joint implant 03/26/2011   Gastroesophageal reflux disease 03/26/2011   Zenker's diverticulum 03/26/2011    Past Medical History:  Diagnosis Date   ADHD    Anemia     Cellulitis 07/2018   left lower extremity   Chronic kidney disease    surgery to fix   Chronic pain    Depression    major depression   DVT (deep venous thrombosis) (HCC)    Ekbom's delusional parasitosis (HCC)    Fibromyalgia    History of Zenker's diverticulum removal    Lyme disease    Migraine with aura    Multiple falls    Thyroid disease    hypothyroid    Past Surgical History:  Procedure Laterality Date   APPENDECTOMY  1974   Avascular necrosis of the hip     multiple operations starting in 2005.  bilateral hips   Back fusion  1999   CERVICAL FUSION  [id number]   EYE SURGERY     eye muscle   KIDNEY SURGERY  1971   kidney reconstruction   LAMINECTOMY  1985   TOTAL HIP ARTHROPLASTY      Current Outpatient Medications  Medication Sig Dispense Refill   betamethasone valerate ointment (VALISONE) 0.1 % Apply 1 application topically 2 (two) times daily. Use twice a day for 2 weeks. 45 g 0   buPROPion HCl ER, XL, 450 MG TB24 Take 450 mg by mouth daily. 30 tablet 3   cyclobenzaprine (FLEXERIL) 5 MG tablet Take 1 tablet (5 mg total) by mouth daily as needed for muscle spasms. Take 1 tablet daily and as needed for muscle spasms 30 tablet  5   Diclofenac Sodium CR 100 MG 24 hr tablet Take 1 tablet (100 mg total) by mouth daily. With food 30 tablet 3   methocarbamol (ROBAXIN) 500 MG tablet Take 1 tablet (500 mg total) by mouth as needed for muscle spasms. 1-2 tablets PRN 30 tablet 3   methylphenidate (RITALIN LA) 20 MG 24 hr capsule Take 1 capsule (20 mg total) by mouth daily. 30 capsule 0   ondansetron (ZOFRAN) 4 MG tablet Take 1 tablet (4 mg total) by mouth every 8 (eight) hours as needed for nausea or vomiting. 20 tablet 0   RITALIN 5 MG tablet Take 1 tablet (5 mg total) by mouth daily as needed. 30 tablet 0   thyroid (NP THYROID) 60 MG tablet Take 1 tablet (60 mg total) by mouth daily before breakfast. 30 tablet 3   topiramate (TOPAMAX) 100 MG tablet Take 100 mg by mouth as  needed.     escitalopram (LEXAPRO) 20 MG tablet Take 2 tablets (40 mg total) by mouth daily. 30 tablet 3   No current facility-administered medications for this visit.     ALLERGIES: Amoxicillin-pot clavulanate, Clarithromycin, Fentanyl, Levofloxacin, Lorazepam, Doxycycline, Penicillins, and Sulfa antibiotics  Family History  Problem Relation Age of Onset   Cancer Sister    Hemochromatosis Sister    Cancer Mother        cancr of unknown origin   Colon cancer Neg Hx    Stomach cancer Neg Hx    Esophageal cancer Neg Hx     Social History   Socioeconomic History   Marital status: Legally Separated    Spouse name: Not on file   Number of children: Not on file   Years of education: Not on file   Highest education level: Not on file  Occupational History   Not on file  Tobacco Use   Smoking status: Never   Smokeless tobacco: Never  Vaping Use   Vaping Use: Never used  Substance and Sexual Activity   Alcohol use: Not Currently    Comment: rarely   Drug use: No   Sexual activity: Not Currently    Birth control/protection: Post-menopausal  Other Topics Concern   Not on file  Social History Narrative   Clinical Psychologist--Dennis McKnight---[phone]      Tobacco use, amount per day now: No   Past tobacco use, amount per day: N/A   How many years did you use tobacco: Never    Alcohol use (drinks per week): Very rarely   Diet: Very health eating   Do you drink/eat things with caffeine: Very rarely   Marital status: Seperated                                 What year were you married? 1991   Do you live in a house, apartment, assisted living, condo, trailer, etc.? House   Is it one or more stories? 2   How many persons live in your home? 3 ( including me )   Do you have pets in your home?( please list)   Highest Level of education completed? Advanced Degree   Current or past profession: Many   Do you exercise? Not currently due to injury.                                  Type and how often?  Do you have a living will? No.   Do you have a DNR form? No.                                  If not, do you want to discuss one?   Do you have signed POA/HPOA forms? No.                       If so, please bring to you appointment      Do you have any difficulty bathing or dressing yourself? No.   Do you have any difficulty preparing food or eating? No.   Do you have any difficulty managing your medications? No.   Do you have any difficulty managing your finances? No.   Do you have any difficulty affording your medications? No.   Social Determinants of Health   Financial Resource Strain: Not on file  Food Insecurity: Not on file  Transportation Needs: Not on file  Physical Activity: Not on file  Stress: Not on file  Social Connections: Not on file  Intimate Partner Violence: Not on file    Review of Systems  PHYSICAL EXAMINATION:    BP 130/82 (BP Location: Right Arm, Patient Position: Sitting, Cuff Size: Normal)   LMP 07/19/2000 (Approximate)     General appearance: alert, cooperative and appears stated age   Pelvic: External genitalia:  no lesions.  Slightly pale coloration of the bilateral labia. Perineum with no lesions.  No ulceration.               Urethra:  normal appearing urethra with no masses, tenderness or lesions          Chaperone was present for exam:  Malen Gauze, CMA.  ASSESSMENT  Chronic vulvitis.  Improved with Valisone ointment.  Abnormal urine appearance.   Decreased sexual desire. Hx DVT.   PLAN  No biopsy needed.  Patient and I are both comfortable with this.  Refill of Valisone ointment.  Urinalysis and reflex culture.  I do not recommend hormonal treatment due to her hx of DVT.   Fu prn.     20 min total time was spent for this patient encounter, including preparation, face-to-face counseling with the patient, coordination of care, and documentation of the encounter.

## 2021-01-16 NOTE — Progress Notes (Deleted)
Gwh short

## 2021-02-17 ENCOUNTER — Other Ambulatory Visit: Payer: Self-pay | Admitting: Family

## 2021-02-17 NOTE — Telephone Encounter (Signed)
RX was last filled on 01/09/2021, no treatment agreement on file, notation made on 04/2021 appointment notes

## 2021-03-04 ENCOUNTER — Encounter: Payer: Self-pay | Admitting: Family

## 2021-03-04 ENCOUNTER — Encounter: Payer: Medicare Other | Admitting: Family

## 2021-03-05 ENCOUNTER — Ambulatory Visit (INDEPENDENT_AMBULATORY_CARE_PROVIDER_SITE_OTHER): Payer: Medicare Other | Admitting: Family

## 2021-03-05 ENCOUNTER — Other Ambulatory Visit: Payer: Self-pay

## 2021-03-05 VITALS — BP 136/82 | HR 99 | Temp 98.0°F | Resp 16 | Ht 59.5 in | Wt 130.2 lb

## 2021-03-05 DIAGNOSIS — R030 Elevated blood-pressure reading, without diagnosis of hypertension: Secondary | ICD-10-CM

## 2021-03-05 DIAGNOSIS — S51011A Laceration without foreign body of right elbow, initial encounter: Secondary | ICD-10-CM

## 2021-03-05 DIAGNOSIS — W19XXXA Unspecified fall, initial encounter: Secondary | ICD-10-CM

## 2021-03-05 DIAGNOSIS — R399 Unspecified symptoms and signs involving the genitourinary system: Secondary | ICD-10-CM | POA: Diagnosis not present

## 2021-03-05 DIAGNOSIS — S0003XA Contusion of scalp, initial encounter: Secondary | ICD-10-CM

## 2021-03-05 DIAGNOSIS — M25561 Pain in right knee: Secondary | ICD-10-CM

## 2021-03-05 DIAGNOSIS — Y92009 Unspecified place in unspecified non-institutional (private) residence as the place of occurrence of the external cause: Secondary | ICD-10-CM

## 2021-03-05 MED ORDER — CLONIDINE HCL 0.1 MG PO TABS
0.1000 mg | ORAL_TABLET | Freq: Once | ORAL | Status: AC
  Administered 2021-03-05: 0.1 mg via ORAL

## 2021-03-05 NOTE — Patient Instructions (Signed)
CT of the head ordered today to be done at [facility] imaging at [facility] then will call you with results.Imaging center will call to schedule appointment.

## 2021-03-05 NOTE — Progress Notes (Signed)
Provider: Richarda Blade FNP-C  Bridgette Wolden, Donalee Citrin, NP  Patient Care Team: Larsen Dungan, Donalee Citrin, NP as PCP - General (Family Medicine)  Extended Emergency Contact Information Primary Emergency Contact: Juanda Chance [place] of [place] Mobile Phone: [phone] Relation: Son Secondary Emergency Contact: Birder Robson III Address: [address]          [place], [place] [address] [place] of [place] Home Phone: [phone] Mobile Phone: [phone] Relation: Spouse  Code Status:  Full Code  Goals of care: Advanced Directive information Advanced Directives 03/05/2021  Does Patient Have a Medical Advance Directive? No  Does patient want to make changes to medical advance directive? -  Would patient like information on creating a medical advance directive? No - Patient declined     Chief Complaint  Patient presents with   Acute Visit    Patient wants to discuss Urologist results and recent fall.   Concern     High Risk Fall.    HPI:  Pt is a 70 y.o. female seen today for an acute visit for evaluation of Fall episode x 2 sustained bruise on left forehead.States fell forward on her hands hit her head on a hard board outside the house.Had no loss of consciousness.son examined her eyes and was alright.She did not go to ED for evaluation.Also had a bruise on left elbow and skin tear on right elbow.Has had intermittent nausea but no vomiting.she denies any headache or dizziness.Has had worsening vision on left eye.  Had pressure all over her head on previous night " cannot explain feelings".   Also complains of right knee pain worsening.seems to put more pressure to right knee while walking with walker. States still has nodes on the side of the head and down to the back of the head from worms that have borrowed under her skin including arms,abdomen,shoulders and legs.Has seen Infectious disease but was Dx with delusional parasitosis.states some of the family members have  started having similar symptoms too. Reports worms sticking to sides of the toilet bowl after voiding. Denies any fever,chills,Abdominal pain or distension.    Past Medical History:  Diagnosis Date   ADHD    Anemia    Cellulitis 07/2018   left lower extremity   Chronic kidney disease    surgery to fix   Chronic pain    Depression    major depression   DVT (deep venous thrombosis) (HCC)    Ekbom's delusional parasitosis (HCC)    Fibromyalgia    History of Zenker's diverticulum removal    Lyme disease    Migraine with aura    Multiple falls    Thyroid disease    hypothyroid   Past Surgical History:  Procedure Laterality Date   APPENDECTOMY  1974   Avascular necrosis of the hip     multiple operations starting in 2005.  bilateral hips   Back fusion  1999   CERVICAL FUSION  [id number]   EYE SURGERY     eye muscle   KIDNEY SURGERY  1971   kidney reconstruction   LAMINECTOMY  1985   TOTAL HIP ARTHROPLASTY      Allergies  Allergen Reactions   Amoxicillin-Pot Clavulanate Nausea And Vomiting and Other (See Comments)   Clarithromycin Other (See Comments)    DOES NOT REMEMBER REACTION DOES NOT REMEMBER REACTION    Fentanyl Nausea And Vomiting and Other (See Comments)    FENTANYL PATCH FENTANYL PATCH FENTANYL PATCH    Levofloxacin Swelling    Face,eye swelling  Face,eye swelling    Lorazepam Other (See Comments)    DO NOT PUT ANY ATIVAN ON PATIENT'S IV POST SURGERY! DO NOT PUT ANY ATIVAN ON PATIENT'S IV POST SURGERY!    Doxycycline Nausea And Vomiting   Penicillins Hives   Sulfa Antibiotics Other (See Comments)    Per pt: unknown    Outpatient Encounter Medications as of 03/05/2021  Medication Sig   betamethasone valerate ointment (VALISONE) 0.1 % Apply 1 application topically 2 (two) times daily. Use for 2 weeks for a flare.  Apply twice a week at bedtime for maintenance dosing.   buPROPion HCl ER, XL, 450 MG TB24 Take 450 mg by mouth daily.   cyclobenzaprine  (FLEXERIL) 5 MG tablet Take 1 tablet (5 mg total) by mouth daily as needed for muscle spasms. Take 1 tablet daily and as needed for muscle spasms   cyclobenzaprine (FLEXERIL) 5 MG tablet Take 5 mg by mouth in the morning, at noon, and at bedtime.   Diclofenac Sodium CR 100 MG 24 hr tablet Take 100 mg by mouth 2 (two) times daily.   escitalopram (LEXAPRO) 20 MG tablet Take 2 tablets (40 mg total) by mouth daily.   methocarbamol (ROBAXIN) 500 MG tablet Take 1 tablet (500 mg total) by mouth as needed for muscle spasms. 1-2 tablets PRN   ondansetron (ZOFRAN) 4 MG tablet Take 1 tablet (4 mg total) by mouth every 8 (eight) hours as needed for nausea or vomiting.   RITALIN 5 MG tablet Take 1 tablet (5 mg total) by mouth daily as needed.   RITALIN LA 20 MG 24 hr capsule TAKE ONE CAPSULE EACH DAY   thyroid (NP THYROID) 60 MG tablet Take 1 tablet (60 mg total) by mouth daily before breakfast.   topiramate (TOPAMAX) 100 MG tablet Take 100 mg by mouth as needed.   [DISCONTINUED] Diclofenac Sodium CR 100 MG 24 hr tablet Take 1 tablet (100 mg total) by mouth daily. With food   [EXPIRED] cloNIDine (CATAPRES) tablet 0.1 mg    No facility-administered encounter medications on file as of 03/05/2021.    Review of Systems  Constitutional:  Negative for appetite change, chills, fatigue, fever and unexpected weight change.  HENT:  Negative for congestion, dental problem, ear discharge, ear pain, facial swelling, hearing loss, nosebleeds, postnasal drip, rhinorrhea, sinus pressure, sinus pain, sneezing, sore throat, tinnitus and trouble swallowing.   Eyes:  Positive for visual disturbance. Negative for pain, discharge, redness and itching.  Respiratory:  Negative for cough, chest tightness, shortness of breath and wheezing.   Cardiovascular:  Negative for chest pain, palpitations and leg swelling.  Gastrointestinal:  Negative for abdominal distention, abdominal pain, blood in stool, constipation, diarrhea, nausea and  vomiting.  Genitourinary:  Positive for frequency. Negative for difficulty urinating, dysuria, flank pain and urgency.  Musculoskeletal:  Positive for arthralgias and gait problem. Negative for back pain, joint swelling, myalgias, neck pain and neck stiffness.  Skin:  Negative for color change, pallor and rash.       Right elbow skin tear  Right forehead bruise/swelling  Left elbow bruise   Neurological:  Negative for dizziness, syncope, speech difficulty, weakness, light-headedness, numbness and headaches.  Hematological:  Does not bruise/bleed easily.  Psychiatric/Behavioral:  Negative for agitation, behavioral problems, confusion, hallucinations, self-injury, sleep disturbance and suicidal ideas. The patient is not nervous/anxious.    Immunization History  Administered Date(s) Administered   Influenza, High Dose Seasonal PF 06/09/2018, 09/07/2019   PFIZER(Purple Top)SARS-COV-2 Vaccination 11/19/2019, 02/12/2020  Pneumococcal Conjugate-13 08/22/2015   Pneumococcal Polysaccharide-23 11/10/2020   Pertinent  Health Maintenance Due  Topic Date Due   MAMMOGRAM  09/08/2013   DEXA SCAN  Never done   INFLUENZA VACCINE  02/16/2021   COLONOSCOPY (Pts 45-1yrs Insurance coverage will need to be confirmed)  12/01/2021   PNA vac Low Risk Adult  Completed   Fall Risk  03/05/2021 03/04/2021 01/01/2021 12/24/2020 11/24/2020  Falls in the past year? 1 0 0 1 0  Comment - - - pain in R hip from fall last week. -  Number falls in past yr: 1 0 0 1 0  Injury with Fall? 1 0 0 1 0  Risk for fall due to : History of fall(s) No Fall Risks No Fall Risks - -  Follow up Falls evaluation completed Falls evaluation completed - - -   Functional Status Survey:    Vitals:   03/05/21 1342 03/05/21 1511  BP: (!) 154/100 136/82  Pulse: 99   Resp: 16   Temp: 98 F (36.7 C)   SpO2: 97%   Weight: 130 lb 3.2 oz (59.1 kg)   Height: 4' 11.5" (1.511 m)    Body mass index is 25.86 kg/m. Physical Exam Vitals  reviewed.  Constitutional:      General: She is not in acute distress.    Appearance: Normal appearance. She is overweight. She is not ill-appearing or diaphoretic.  HENT:     Head: Normocephalic.     Mouth/Throat:     Mouth: Mucous membranes are moist.     Pharynx: Oropharynx is clear. No oropharyngeal exudate or posterior oropharyngeal erythema.  Eyes:     General: No scleral icterus.       Right eye: No discharge.        Left eye: No discharge.     Extraocular Movements: Extraocular movements intact.     Conjunctiva/sclera: Conjunctivae normal.     Pupils: Pupils are equal, round, and reactive to light.  Neck:     Vascular: No carotid bruit.  Cardiovascular:     Rate and Rhythm: Normal rate and regular rhythm.     Pulses: Normal pulses.     Heart sounds: Normal heart sounds. No murmur heard.   No friction rub. No gallop.  Pulmonary:     Effort: Pulmonary effort is normal. No respiratory distress.     Breath sounds: Normal breath sounds. No wheezing, rhonchi or rales.  Chest:     Chest wall: No tenderness.  Abdominal:     General: Bowel sounds are normal. There is no distension.     Palpations: Abdomen is soft. There is no mass.     Tenderness: There is no abdominal tenderness. There is no right CVA tenderness, left CVA tenderness, guarding or rebound.  Musculoskeletal:        General: No swelling. Normal range of motion.     Cervical back: Normal range of motion. No rigidity or tenderness.     Right knee: No swelling, effusion or erythema. Normal range of motion. Tenderness present. Normal pulse.     Left knee: Normal.     Right lower leg: No edema.     Left lower leg: No edema.     Comments: Gait steady with a walker   Lymphadenopathy:     Cervical: No cervical adenopathy.  Skin:    General: Skin is warm and dry.     Coloration: Skin is not pale.     Findings: No erythema or rash.  Comments: Right elbow < 3 cm skin tear with flap intact.band aid applied.no  bleeding or signs of infection. Left forehead contusion and purple bruise noted.slightly tender to touch.  Left elbow purple bruise noted.   Neurological:     Mental Status: She is alert and oriented to person, place, and time.     Cranial Nerves: No cranial nerve deficit.     Sensory: No sensory deficit.     Motor: No weakness.     Coordination: Coordination normal.     Gait: Gait abnormal.  Psychiatric:        Mood and Affect: Mood normal.        Speech: Speech normal.        Behavior: Behavior normal.        Thought Content: Thought content normal.        Judgment: Judgment normal.    Labs reviewed: Recent Labs    09/12/20 1716 11/17/20 1209 12/24/20 1019  NA 139 139 139  K 3.6 4.2 4.5  CL 106 106 104  CO2 23 20 27   GLUCOSE 88 76 104*  BUN 20 17 18   CREATININE 1.01* 0.86 0.86  CALCIUM 9.0 9.8 9.7   Recent Labs    11/17/20 1209 12/24/20 1019  AST 22 17  ALT 17 13  BILITOT 0.6 0.5  PROT 7.1 7.0   Recent Labs    11/17/20 1209 12/19/20 1337 12/24/20 1019  WBC 5.0 5.7 5.3  NEUTROABS 2,825  --  3,636  HGB 14.2 13.4 14.3  HCT 44.2 41.0 44.7  MCV 88.9 89.5 89.0  PLT 361 278 299   Lab Results  Component Value Date   TSH 1.51 11/17/2020   No results found for: HGBA1C Lab Results  Component Value Date   CHOL 164 11/17/2020   HDL 78 11/17/2020   LDLCALC 72 11/17/2020   TRIG 49 11/17/2020   CHOLHDL 2.1 11/17/2020    Significant Diagnostic Results in last 30 days:  No results found.  Assessment/Plan 1. Fall in home, initial encounter Larey SeatFell outside her yard hit head on a had board.No loss of consciousness. - CT HEAD WO CONTRAST (5MM); Future [place] imaging address provided on AVS but states familiar with imaging center.  2. Contusion of scalp, initial encounter Left forehead swelling with purple bruise noted.  - CT HEAD WO CONTRAST (5MM); Future  3. Symptoms of urinary tract infection Unable to provide urine specimen during visit.sterile urine  cup and supplies to collect urine specimen then drop to office. - urine specimen with urologist was negative. - continue to follow up with Urologist   4. Elevated blood pressure reading B/p elevated this visit.No Orthostatic  Clonidine given  - Advised to check Blood pressure at home and record on log provided and notify provider if B/p > 140/90  - cloNIDine (CATAPRES) tablet 0.1 mg  5. Right knee pain, unspecified chronicity No swelling,erythema or ecchymosis.tender to palpation    - DG Knee Complete 4 Views Right; Future Familiar with imaging Center   6. Skin tear of right elbow without complication, initial encounter Sustained during recent fall.Skin flap intact no signs of infection.advised to keep skin tear clean and cover with a Band aid and change daily until healed.   Family/ staff Communication: Reviewed plan of care with patient verbalized understanding  Labs/tests ordered:  - CT HEAD WO CONTRAST (5MM); Future  Next Appointment: As needed if symptoms worsen or fail to improve    Caesar Bookmaninah C Afomia Blackley, NP

## 2021-03-06 ENCOUNTER — Telehealth: Payer: Self-pay | Admitting: *Deleted

## 2021-03-06 DIAGNOSIS — K219 Gastro-esophageal reflux disease without esophagitis: Secondary | ICD-10-CM

## 2021-03-06 MED ORDER — ONDANSETRON HCL 4 MG PO TABS: 4.0000 mg | ORAL_TABLET | Freq: Three times a day (TID) | ORAL | 0 refills | Status: DC | PRN

## 2021-03-06 NOTE — Telephone Encounter (Signed)
Patient called back and informed of the response from Richarda Blade, NP. She still wanted to know if she could get a few Tramadol, I agin informed her of the recommendations of Dianh Ngetich, NP she eventually verbalized her understanding and agreed.

## 2021-03-06 NOTE — Telephone Encounter (Signed)
Already on Diclofenac sodium CR 100 mg tablet which is in the same group with - Tramadol.Take Extra strength Tylenol 500 mg tablet every 8 hours for pain.Try to avoid sedative medication due to increase risk of fall.   Also recommend Biofreeze 4% gel one application every 8 hrs for right knee pain   Have you had  right knee x-ray done as ordered yesterday?

## 2021-03-06 NOTE — Telephone Encounter (Signed)
Patient notified and agreed.  

## 2021-03-06 NOTE — Telephone Encounter (Signed)
Patient called and wanted to know if she could have some Tramadol for the pains in her knees and arms from the fall. Stated that she was just seen yesterday and evaluated.   Please Advise.

## 2021-03-06 NOTE — Telephone Encounter (Signed)
Okay to bring urine specimen on Monday.glad diarrhea has improved.May take over the counter  imodium as needed if diarrhea persist.

## 2021-03-06 NOTE — Telephone Encounter (Signed)
Patient called and stated that she was going to bring in the urine specimen this morning that you requested but she has been up all night with Horrible Diarrhea.  Stated that it is better this morning but she is worn out and nausea. Stated that she will bring in the urine on Monday.   Patient is requesting a refill on the Ondansetron that she has had in the past.  Pended for approval.

## 2021-03-08 NOTE — Progress Notes (Signed)
This encounter was created in error - please disregard. Appointment cancelled and rescheduled to 03/05/2021

## 2021-03-11 ENCOUNTER — Telehealth (INDEPENDENT_AMBULATORY_CARE_PROVIDER_SITE_OTHER): Payer: Medicare Other | Admitting: Family

## 2021-03-11 ENCOUNTER — Encounter: Payer: Self-pay | Admitting: Family

## 2021-03-11 ENCOUNTER — Other Ambulatory Visit: Payer: Self-pay

## 2021-03-11 DIAGNOSIS — G8929 Other chronic pain: Secondary | ICD-10-CM

## 2021-03-11 DIAGNOSIS — M542 Cervicalgia: Secondary | ICD-10-CM

## 2021-03-11 DIAGNOSIS — R112 Nausea with vomiting, unspecified: Secondary | ICD-10-CM

## 2021-03-11 DIAGNOSIS — R519 Headache, unspecified: Secondary | ICD-10-CM

## 2021-03-11 NOTE — Patient Instructions (Addendum)
Please go to ED for evaluation of brown emesis  Nausea, Adult Nausea is the feeling that you have an upset stomach or that you are about to vomit. Nausea on its own is not usually a serious concern, but it may be an early sign of a more serious medical problem. As nausea gets worse, it can lead to vomiting. If vomiting develops, or if you are not able to drink enough fluids, you are at risk of becoming dehydrated. Dehydration can make you tired and thirsty, cause you to have a dry mouth, and decrease how often you urinate. Older adults and people with other diseases or a weak disease-fighting system (immune system) are at higher risk for dehydration. The main goals of treating your nausea are: To relieve your nausea. To limit repeated nausea episodes. To prevent vomiting and dehydration. Follow these instructions at home: Watch your symptoms for any changes. Tell your health care provider about them.Follow these instructions as told by your health care provider. Eating and drinking     Take an oral rehydration solution (ORS). This is a drink that is sold at pharmacies and retail stores. Drink clear fluids slowly and in small amounts as you are able. Clear fluids include water, ice chips, low-calorie sports drinks, and fruit juice that has water added (diluted fruit juice). Eat bland, easy-to-digest foods in small amounts as you are able. These foods include bananas, applesauce, rice, lean meats, toast, and crackers. Avoid drinking fluids that contain a lot of sugar or caffeine, such as energy drinks, sports drinks, and soda. Avoid alcohol. Avoid spicy or fatty foods. General instructions Take over-the-counter and prescription medicines only as told by your health care provider. Rest at home while you recover. Drink enough fluid to keep your urine pale yellow. Breathe slowly and deeply when you feel nauseous. Avoid smelling things that have strong odors. Wash your hands often using soap and  water. If soap and water are not available, use hand sanitizer. Make sure that all people in your household wash their hands well and often. Keep all follow-up visits as told by your health care provider. This is important. Contact a health care provider if: Your nausea gets worse. Your nausea does not go away after two days. You vomit. You cannot drink fluids without vomiting. You have any of the following: New symptoms. A fever. A headache. Muscle cramps. A rash. Pain while urinating. You feel light-headed or dizzy. Get help right away if: You have pain in your chest, neck, arm, or jaw. You feel extremely weak or you faint. You have vomit that is bright red or looks like coffee grounds. You have bloody or black stools or stools that look like tar. You have a severe headache, a stiff neck, or both. You have severe pain, cramping, or bloating in your abdomen. You have difficulty breathing or are breathing very quickly. Your heart is beating very quickly. Your skin feels cold and clammy. You feel confused. You have signs of dehydration, such as: Dark urine, very little urine, or no urine. Cracked lips. Dry mouth. Sunken eyes. Sleepiness. Weakness. These symptoms may represent a serious problem that is an emergency. Do not wait to see if the symptoms will go away. Get medical help right away. Call your local emergency services (911 in the [place] Do not drive yourself to the hospital. Summary Nausea is the feeling that you have an upset stomach or that you are about to vomit. Nausea on its own is not usually a serious  concern, but it may be an early sign of a more serious medical problem. If vomiting develops, or if you are not able to drink enough fluids, you are at risk of becoming dehydrated. Follow recommendations for eating and drinking and take over-the-counter and prescription medicines only as told by your health care provider. Contact a health care provider right away if  your symptoms worsen or you have new symptoms. Keep all follow-up visits as told by your health care provider. This is important. This information is not intended to replace advice given to you by your health care provider. Make sure you discuss any questions you have with your healthcare provider. Document Revised: 06/05/2019 Document Reviewed: 12/13/2017 Elsevier Patient Education  2022 [employer].

## 2021-03-11 NOTE — Progress Notes (Signed)
Provider: Richarda Blade FNP-C  Crystal Ellwood, Donalee Citrin, NP  Patient Care Team: Khadar Monger, Donalee Citrin, NP as PCP - General (Family Medicine)  Extended Emergency Contact Information Primary Emergency Contact: Juanda Chance [place] of [place] Mobile Phone: [phone] Relation: Son Secondary Emergency Contact: Birder Robson III Address: [address]          [place], [place] [address] [place] of [place] Home Phone: [phone] Mobile Phone: [phone] Relation: Spouse  Code Status:  full Code  Goals of care: Advanced Directive information Advanced Directives 03/11/2021  Does Patient Have a Medical Advance Directive? No  Does patient want to make changes to medical advance directive? -  Would patient like information on creating a medical advance directive? No - Patient declined     Chief Complaint  Patient presents with   Acute Visit    Patient complains of lots of vomiting and headache.    HPI:  Pt is a 70 y.o. female seen today for an acute visit for evaluation of headache and vomiting x 1 day.Started around 11: 15 am after breakfast.Had tapioca.Has been running low grade fever several days before vomiting.she denies any abdominal distension,pain,cramping or constipation.  Headache from spine to behind the ear.Has had this in the past.He request cervical CT scan to be added to head CT scan recent ordered. Denies any dizziness or change in vision.   Addendum; patient called back to office to speak with provider states now throwing up dark brownish emesis.she was advised to go to ED immediately for evaluation.  Past Medical History:  Diagnosis Date   ADHD    Anemia    Cellulitis 07/2018   left lower extremity   Chronic kidney disease    surgery to fix   Chronic pain    Depression    major depression   DVT (deep venous thrombosis) (HCC)    Ekbom's delusional parasitosis (HCC)    Fibromyalgia    History of Zenker's diverticulum removal    Lyme disease     Migraine with aura    Multiple falls    Thyroid disease    hypothyroid   Past Surgical History:  Procedure Laterality Date   APPENDECTOMY  1974   Avascular necrosis of the hip     multiple operations starting in 2005.  bilateral hips   Back fusion  1999   CERVICAL FUSION  7619,5093   EYE SURGERY     eye muscle   KIDNEY SURGERY  1971   kidney reconstruction   LAMINECTOMY  1985   TOTAL HIP ARTHROPLASTY      Allergies  Allergen Reactions   Amoxicillin-Pot Clavulanate Nausea And Vomiting and Other (See Comments)   Clarithromycin Other (See Comments)    DOES NOT REMEMBER REACTION DOES NOT REMEMBER REACTION    Fentanyl Nausea And Vomiting and Other (See Comments)    FENTANYL PATCH FENTANYL PATCH FENTANYL PATCH    Levofloxacin Swelling    Face,eye swelling Face,eye swelling    Lorazepam Other (See Comments)    DO NOT PUT ANY ATIVAN ON PATIENT'S IV POST SURGERY! DO NOT PUT ANY ATIVAN ON PATIENT'S IV POST SURGERY!    Doxycycline Nausea And Vomiting   Penicillins Hives   Sulfa Antibiotics Other (See Comments)    Per pt: unknown    Outpatient Encounter Medications as of 03/11/2021  Medication Sig   betamethasone valerate ointment (VALISONE) 0.1 % Apply 1 application topically 2 (two) times daily. Use for 2 weeks for a flare.  Apply twice a  week at bedtime for maintenance dosing.   buPROPion HCl ER, XL, 450 MG TB24 Take 450 mg by mouth daily.   cyclobenzaprine (FLEXERIL) 5 MG tablet Take 1 tablet (5 mg total) by mouth daily as needed for muscle spasms. Take 1 tablet daily and as needed for muscle spasms   cyclobenzaprine (FLEXERIL) 5 MG tablet Take 5 mg by mouth in the morning, at noon, and at bedtime.   Diclofenac Sodium CR 100 MG 24 hr tablet Take 100 mg by mouth 2 (two) times daily.   methocarbamol (ROBAXIN) 500 MG tablet Take 1 tablet (500 mg total) by mouth as needed for muscle spasms. 1-2 tablets PRN   ondansetron (ZOFRAN) 4 MG tablet Take 1 tablet (4 mg total) by mouth  every 8 (eight) hours as needed for nausea or vomiting.   RITALIN 5 MG tablet Take 1 tablet (5 mg total) by mouth daily as needed.   RITALIN LA 20 MG 24 hr capsule TAKE ONE CAPSULE EACH DAY   thyroid (NP THYROID) 60 MG tablet Take 1 tablet (60 mg total) by mouth daily before breakfast.   topiramate (TOPAMAX) 100 MG tablet Take 100 mg by mouth as needed.   escitalopram (LEXAPRO) 20 MG tablet Take 2 tablets (40 mg total) by mouth daily.   No facility-administered encounter medications on file as of 03/11/2021.    Review of Systems  Constitutional:  Negative for appetite change, chills, fatigue, fever and unexpected weight change.  HENT:  Negative for congestion, dental problem, ear discharge, ear pain, facial swelling, hearing loss, nosebleeds, postnasal drip, rhinorrhea, sinus pressure, sinus pain, sneezing, sore throat, tinnitus and trouble swallowing.   Eyes:  Negative for pain, discharge, redness, itching and visual disturbance.  Respiratory:  Negative for cough, chest tightness, shortness of breath and wheezing.   Cardiovascular:  Negative for chest pain, palpitations and leg swelling.  Gastrointestinal:  Positive for nausea and vomiting. Negative for abdominal distention, abdominal pain, blood in stool, constipation and diarrhea.       Brownish emesis   Endocrine: Negative for cold intolerance, heat intolerance, polydipsia, polyphagia and polyuria.  Genitourinary:  Negative for difficulty urinating, dysuria, flank pain, frequency and urgency.  Musculoskeletal:  Negative for arthralgias, back pain, gait problem, joint swelling, myalgias, neck pain and neck stiffness.  Skin:  Negative for color change, pallor, rash and wound.  Neurological:  Negative for dizziness, syncope, speech difficulty, weakness, light-headedness, numbness and headaches.  Hematological:  Does not bruise/bleed easily.  Psychiatric/Behavioral:  Negative for agitation, behavioral problems, confusion, hallucinations,  self-injury, sleep disturbance and suicidal ideas. The patient is not nervous/anxious.    Immunization History  Administered Date(s) Administered   Influenza, High Dose Seasonal PF 06/09/2018, 09/07/2019   PFIZER(Purple Top)SARS-COV-2 Vaccination 11/19/2019, 02/12/2020   Pneumococcal Conjugate-13 08/22/2015   Pneumococcal Polysaccharide-23 11/10/2020   Pertinent  Health Maintenance Due  Topic Date Due   MAMMOGRAM  09/08/2013   DEXA SCAN  Never done   INFLUENZA VACCINE  02/16/2021   COLONOSCOPY (Pts 45-79yrs Insurance coverage will need to be confirmed)  12/01/2021   PNA vac Low Risk Adult  Completed   Fall Risk  03/11/2021 03/05/2021 03/04/2021 01/01/2021 12/24/2020  Falls in the past year? 1 1 0 0 1  Comment - - - - pain in R hip from fall last week.  Number falls in past yr: 1 1 0 0 1  Injury with Fall? 1 1 0 0 1  Risk for fall due to : History of fall(s) History of  fall(s) No Fall Risks No Fall Risks -  Follow up Falls evaluation completed Falls evaluation completed Falls evaluation completed - -   Functional Status Survey:    There were no vitals filed for this visit. There is no height or weight on file to calculate BMI. Physical Exam Unable to complete on the phone.   Labs reviewed: Recent Labs    09/12/20 1716 11/17/20 1209 12/24/20 1019  NA 139 139 139  K 3.6 4.2 4.5  CL 106 106 104  CO2 23 20 27   GLUCOSE 88 76 104*  BUN 20 17 18   CREATININE 1.01* 0.86 0.86  CALCIUM 9.0 9.8 9.7   Recent Labs    11/17/20 1209 12/24/20 1019  AST 22 17  ALT 17 13  BILITOT 0.6 0.5  PROT 7.1 7.0   Recent Labs    11/17/20 1209 12/19/20 1337 12/24/20 1019  WBC 5.0 5.7 5.3  NEUTROABS 2,825  --  3,636  HGB 14.2 13.4 14.3  HCT 44.2 41.0 44.7  MCV 88.9 89.5 89.0  PLT 361 278 299   Lab Results  Component Value Date   TSH 1.51 11/17/2020   No results found for: HGBA1C Lab Results  Component Value Date   CHOL 164 11/17/2020   HDL 78 11/17/2020   LDLCALC 72 11/17/2020    TRIG 49 11/17/2020   CHOLHDL 2.1 11/17/2020    Significant Diagnostic Results in last 30 days:  No results found.  Assessment/Plan 1. Non-intractable vomiting with nausea, unspecified vomiting type Vomiting x 1 day call back after visit to report that emesis went from clear to dark brown in color.Abdomen non-distended or tender.Bowel moving at her baseline.  - on Zofran  - advised to go to ED for evaluation of brownish emesis.  2. Chronic nonintractable headache, unspecified headache type Chronic  Request cervical CT scan to be added to recent head CT scan order - CT CERVICAL SPINE W CONTRAST; Future  3. Cervicalgia Chronic worst after fall episode  - CT CERVICAL SPINE W CONTRAST; Future   Family/ staff Communication: Reviewed plan of care with patient verbalized understanding   Labs/tests ordered:  - CT CERVICAL SPINE W CONTRAST; Future  Next Appointment: As needed if symptoms worsen or fail to improve   I connected with  01/17/2021 on 03/11/21 by a Telephone enabled telemedicine application and verified that I am speaking with the correct person using two identifiers.   I discussed the limitations of evaluation and management by telemedicine. The patient expressed understanding and agreed to proceed.   Spent 15 minutes of non-face to face with patient    Mindi Slicker, NP

## 2021-03-11 NOTE — Progress Notes (Signed)
  This service is provided via telemedicine  No vital signs collected/recorded due to the encounter was a telemedicine visit.   Location of patient (ex: home, work):  Home.  Patient consents to a telephone visit:  Yes  Location of the provider (ex: office, home):  [facility] Office.  Name of any referring provider:  Ngetich, Dinah C, NP   Names of all persons participating in the telemedicine service and their role in the encounter:  Patient, [name], RMA, Ngetich, Dinah, NP.    Time spent on call:  8 minutes spent on the phone with Medical Assistant.    

## 2021-03-19 ENCOUNTER — Other Ambulatory Visit: Payer: Self-pay | Admitting: Family

## 2021-03-19 NOTE — Telephone Encounter (Signed)
Pharmacy requested refill Epic LR: 02/17/2021 Contract on [employer] Rx and sent to [place] for approval (Dinah out of office)

## 2021-03-25 ENCOUNTER — Other Ambulatory Visit: Payer: Self-pay | Admitting: Family

## 2021-03-25 DIAGNOSIS — E039 Hypothyroidism, unspecified: Secondary | ICD-10-CM

## 2021-03-25 NOTE — Telephone Encounter (Signed)
Patient medication has warnings stating that it's not the preferred formulary. Patient medication pend and sent to PCP Ngetich, Donalee Citrin, NP for approval/editing. Please Advise.

## 2021-03-27 ENCOUNTER — Ambulatory Visit
Admission: RE | Admit: 2021-03-27 | Discharge: 2021-03-27 | Disposition: A | Discharge status: Home / Self Care | Payer: Medicare Other | Source: Ambulatory Visit | Attending: Family | Admitting: Family

## 2021-03-27 ENCOUNTER — Other Ambulatory Visit: Payer: Medicare Other

## 2021-03-27 DIAGNOSIS — G8929 Other chronic pain: Secondary | ICD-10-CM

## 2021-03-27 DIAGNOSIS — W19XXXA Unspecified fall, initial encounter: Secondary | ICD-10-CM

## 2021-03-27 DIAGNOSIS — M542 Cervicalgia: Secondary | ICD-10-CM

## 2021-03-27 DIAGNOSIS — M25561 Pain in right knee: Secondary | ICD-10-CM

## 2021-03-27 DIAGNOSIS — R519 Headache, unspecified: Secondary | ICD-10-CM

## 2021-03-27 DIAGNOSIS — Y92009 Unspecified place in unspecified non-institutional (private) residence as the place of occurrence of the external cause: Secondary | ICD-10-CM

## 2021-03-27 DIAGNOSIS — S0003XA Contusion of scalp, initial encounter: Secondary | ICD-10-CM

## 2021-03-31 ENCOUNTER — Other Ambulatory Visit: Payer: Self-pay

## 2021-03-31 ENCOUNTER — Ambulatory Visit: Payer: Medicare Other | Admitting: Family

## 2021-03-31 DIAGNOSIS — M2540 Effusion, unspecified joint: Secondary | ICD-10-CM

## 2021-04-01 ENCOUNTER — Other Ambulatory Visit: Payer: Self-pay | Admitting: Family

## 2021-04-01 ENCOUNTER — Ambulatory Visit (INDEPENDENT_AMBULATORY_CARE_PROVIDER_SITE_OTHER): Payer: Medicare Other | Admitting: Family

## 2021-04-01 ENCOUNTER — Other Ambulatory Visit: Payer: Self-pay

## 2021-04-01 ENCOUNTER — Encounter: Payer: Self-pay | Admitting: Family

## 2021-04-01 VITALS — BP 138/72 | HR 100 | Temp 97.1°F | Ht 60.0 in | Wt 129.4 lb

## 2021-04-01 DIAGNOSIS — M25461 Effusion, right knee: Secondary | ICD-10-CM

## 2021-04-01 DIAGNOSIS — M25561 Pain in right knee: Secondary | ICD-10-CM

## 2021-04-01 DIAGNOSIS — R1310 Dysphagia, unspecified: Secondary | ICD-10-CM

## 2021-04-01 DIAGNOSIS — M2540 Effusion, unspecified joint: Secondary | ICD-10-CM

## 2021-04-01 DIAGNOSIS — K219 Gastro-esophageal reflux disease without esophagitis: Secondary | ICD-10-CM | POA: Diagnosis not present

## 2021-04-01 DIAGNOSIS — F331 Major depressive disorder, recurrent, moderate: Secondary | ICD-10-CM

## 2021-04-01 DIAGNOSIS — L989 Disorder of the skin and subcutaneous tissue, unspecified: Secondary | ICD-10-CM

## 2021-04-01 DIAGNOSIS — R1110 Vomiting, unspecified: Secondary | ICD-10-CM | POA: Diagnosis not present

## 2021-04-01 MED ORDER — ONDANSETRON HCL 4 MG PO TABS: 4.0000 mg | ORAL_TABLET | Freq: Three times a day (TID) | ORAL | 0 refills | Status: DC | PRN

## 2021-04-01 MED ORDER — ONDANSETRON HCL 4 MG PO TABS: 4.0000 mg | ORAL_TABLET | Freq: Three times a day (TID) | ORAL | 1 refills | Status: DC | PRN

## 2021-04-01 MED ORDER — SERTRALINE HCL 25 MG PO TABS: 25.0000 mg | ORAL_TABLET | Freq: Every day | ORAL | 1 refills | Status: DC

## 2021-04-01 NOTE — Progress Notes (Signed)
Provider: Richarda Blade FNP-C  Cody Albus, Donalee Citrin, NP  Patient Care Team: Jannah Guardiola, Donalee Citrin, NP as PCP - General (Family Medicine)  Extended Emergency Contact Information Primary Emergency Contact: Juanda Chance [place] Mobile Phone: [phone] Relation: Son Secondary Emergency Contact: [name],[name],[name] Home Phone: [phone] Relation: Friend  Code Status:  Full Code  Goals of care: Advanced Directive information Advanced Directives 04/01/2021  Does Patient Have a Medical Advance Directive? No  Does patient want to make changes to medical advance directive? -  Would patient like information on creating a medical advance directive? No - Patient declined     Chief Complaint  Patient presents with   Acute Visit    Stomach issues and ongoing concerns related to fall. Patient states she would also like to address concerns related to infectious disease. Patient thinks she already had flu vaccine this season.    HPI:  Pt is a 70 y.o. female seen today for an acute visit for evaluation of ongoing abdominal pain.she continues to complain of infestations of parasites in her abdomen and states has spread all over the body.Brought new pictures of her stool showing provider white spots that she believes are from the worms.Has brought several pictures in the past. Also states unable to swallow her food at times.ends up spiting up foods.she took a picture of her saliva that has bubbles that she states has worms. Also shows me areas on her upper arms that she believes worm are underneath the skin.she is more concerned about her scalp especially the back side of the head where she states feels "Bumps" which are worsening.would like to see a dermatologist for this. States recent fall episode has worsen symptoms in her brain.CT scan done showed  no evidence of fracture.chronic microvascular ischemic changes in the cerebral white matter noted.thinks possible from the  parasite.cervical CT scan showed severe multilevel degenerative disc disease and cervical spondylosis with postoperative changes of ACDF at C 5 - C 6   States stopped taking lexapro did not wean off.Anxiety and depression has worsen.thinks has taken lexapro for a long time.  She denies any new acute issues.  Has seen Infectious disease was Diagnosed with Ekbom's delusional parasitosis. Stool specimen has been send for culture which was negative but states local laboratories does not screen for such parasites in the stool states would like stool send to a lab [place] side the [place] that deals with such cases like hers.states will research more just needs a provider's approval.     Past Medical History:  Diagnosis Date   ADHD    Anemia    Cellulitis 07/2018   left lower extremity   Chronic kidney disease    surgery to fix   Chronic pain    Depression    major depression   DVT (deep venous thrombosis) (HCC)    Ekbom's delusional parasitosis (HCC)    Fibromyalgia    History of Zenker's diverticulum removal    Lyme disease    Migraine with aura    Multiple falls    Thyroid disease    hypothyroid   Past Surgical History:  Procedure Laterality Date   APPENDECTOMY  1974   Avascular necrosis of the hip     multiple operations starting in 2005.  bilateral hips   Back fusion  1999   CERVICAL FUSION  2956,2130   EYE SURGERY     eye muscle   KIDNEY SURGERY  1971   kidney reconstruction  LAMINECTOMY  1985   TOTAL HIP ARTHROPLASTY      Allergies  Allergen Reactions   Amoxicillin-Pot Clavulanate Nausea And Vomiting and Other (See Comments)   Clarithromycin Other (See Comments)    DOES NOT REMEMBER REACTION DOES NOT REMEMBER REACTION    Fentanyl Nausea And Vomiting and Other (See Comments)    FENTANYL PATCH FENTANYL PATCH FENTANYL PATCH    Levofloxacin Swelling    Face,eye swelling Face,eye swelling    Lorazepam Other (See Comments)    DO NOT PUT ANY ATIVAN ON  PATIENT'S IV POST SURGERY! DO NOT PUT ANY ATIVAN ON PATIENT'S IV POST SURGERY!    Doxycycline Nausea And Vomiting   Penicillins Hives   Sulfa Antibiotics Other (See Comments)    Per pt: unknown    Outpatient Encounter Medications as of 04/01/2021  Medication Sig   betamethasone valerate ointment (VALISONE) 0.1 % Apply 1 application topically 2 (two) times daily. Use for 2 weeks for a flare.  Apply twice a week at bedtime for maintenance dosing.   buPROPion HCl ER, XL, 450 MG TB24 Take 450 mg by mouth daily.   cyclobenzaprine (FLEXERIL) 5 MG tablet Take 1 tablet (5 mg total) by mouth daily as needed for muscle spasms. Take 1 tablet daily and as needed for muscle spasms   cyclobenzaprine (FLEXERIL) 5 MG tablet Take 5 mg by mouth in the morning, at noon, and at bedtime.   Diclofenac Sodium CR 100 MG 24 hr tablet Take 100 mg by mouth 2 (two) times daily.   escitalopram (LEXAPRO) 20 MG tablet Take 2 tablets (40 mg total) by mouth daily.   methocarbamol (ROBAXIN) 500 MG tablet Take 1 tablet (500 mg total) by mouth as needed for muscle spasms. 1-2 tablets PRN   NP THYROID 60 MG tablet TAKE ONE TABLET EACH DAY BEFORE BREAKFAST   ondansetron (ZOFRAN) 4 MG tablet Take 1 tablet (4 mg total) by mouth every 8 (eight) hours as needed for nausea or vomiting.   RITALIN 5 MG tablet Take 1 tablet (5 mg total) by mouth daily as needed.   RITALIN LA 20 MG 24 hr capsule TAKE ONE CAPSULE EACH DAY   topiramate (TOPAMAX) 100 MG tablet Take 100 mg by mouth as needed.   No facility-administered encounter medications on file as of 04/01/2021.    Review of Systems  Constitutional:  Negative for appetite change, chills, fatigue, fever and unexpected weight change.  HENT:  Negative for congestion, dental problem, ear discharge, ear pain, facial swelling, hearing loss, nosebleeds, postnasal drip, rhinorrhea, sinus pressure, sinus pain, sneezing, sore throat, tinnitus and trouble swallowing.   Eyes:  Negative for pain,  discharge, redness, itching and visual disturbance.  Respiratory:  Negative for cough, chest tightness, shortness of breath and wheezing.   Cardiovascular:  Negative for chest pain, palpitations and leg swelling.  Gastrointestinal:  Negative for abdominal distention, abdominal pain, blood in stool, constipation, diarrhea, nausea and vomiting.  Musculoskeletal:  Positive for arthralgias and gait problem. Negative for back pain, joint swelling, myalgias, neck pain and neck stiffness.  Skin:  Negative for color change, pallor, rash and wound.  Neurological:  Negative for dizziness, syncope, speech difficulty, weakness, light-headedness, numbness and headaches.  Hematological:  Does not bruise/bleed easily.  Psychiatric/Behavioral:  Negative for agitation, behavioral problems, confusion, hallucinations, self-injury, sleep disturbance and suicidal ideas. The patient is not nervous/anxious.    Immunization History  Administered Date(s) Administered   Influenza, High Dose Seasonal PF 06/09/2018, 09/07/2019   PFIZER(Purple Top)SARS-COV-2 Vaccination  11/19/2019, 02/12/2020   Pneumococcal Conjugate-13 08/22/2015   Pneumococcal Polysaccharide-23 11/10/2020   Pertinent  Health Maintenance Due  Topic Date Due   MAMMOGRAM  09/08/2013   DEXA SCAN  Never done   INFLUENZA VACCINE  02/16/2021   COLONOSCOPY (Pts 45-53yrs Insurance coverage will need to be confirmed)  12/01/2021   PNA vac Low Risk Adult  Completed   Fall Risk  03/11/2021 03/05/2021 03/04/2021 01/01/2021 12/24/2020  Falls in the past year? 1 1 0 0 1  Comment - - - - pain in R hip from fall last week.  Number falls in past yr: 1 1 0 0 1  Injury with Fall? 1 1 0 0 1  Risk for fall due to : History of fall(s) History of fall(s) No Fall Risks No Fall Risks -  Follow up Falls evaluation completed Falls evaluation completed Falls evaluation completed - -   Functional Status Survey:    Vitals:   04/01/21 1419  BP: 138/72  Pulse: 100  Temp: (!)  97.1 F (36.2 C)  TempSrc: Temporal  SpO2: 98%  Weight: 129 lb 6.4 oz (58.7 kg)  Height: 5' (1.524 m)   Body mass index is 25.27 kg/m. Physical Exam Vitals reviewed.  Constitutional:      General: She is not in acute distress.    Appearance: Normal appearance. She is normal weight. She is not ill-appearing or diaphoretic.  HENT:     Head: Normocephalic.     Right Ear: Tympanic membrane, ear canal and external ear normal. There is no impacted cerumen.     Left Ear: Tympanic membrane, ear canal and external ear normal. There is no impacted cerumen.     Nose: Nose normal. No congestion or rhinorrhea.     Mouth/Throat:     Mouth: Mucous membranes are moist.     Pharynx: Oropharynx is clear. No oropharyngeal exudate or posterior oropharyngeal erythema.  Eyes:     General: No scleral icterus.       Right eye: No discharge.        Left eye: No discharge.     Extraocular Movements: Extraocular movements intact.     Conjunctiva/sclera: Conjunctivae normal.     Pupils: Pupils are equal, round, and reactive to light.  Neck:     Vascular: No carotid bruit.  Cardiovascular:     Rate and Rhythm: Normal rate and regular rhythm.     Pulses: Normal pulses.     Heart sounds: Normal heart sounds. No murmur heard.   No friction rub. No gallop.  Pulmonary:     Effort: Pulmonary effort is normal. No respiratory distress.     Breath sounds: Normal breath sounds. No wheezing, rhonchi or rales.  Chest:     Chest wall: No tenderness.  Abdominal:     General: Bowel sounds are normal. There is no distension.     Palpations: Abdomen is soft. There is no mass.     Tenderness: There is no abdominal tenderness. There is no right CVA tenderness, left CVA tenderness, guarding or rebound.  Musculoskeletal:        General: No swelling or tenderness. Normal range of motion.     Cervical back: Normal range of motion. No rigidity or tenderness.     Right lower leg: No edema.     Left lower leg: No edema.   Lymphadenopathy:     Cervical: No cervical adenopathy.  Skin:    General: Skin is warm and dry.  Coloration: Skin is not pale.     Findings: No bruising, erythema, lesion or rash.     Comments: No palpable lesion or swelling noted on upper arms or scalp.   Neurological:     Mental Status: She is alert and oriented to person, place, and time.     Cranial Nerves: No cranial nerve deficit.     Sensory: No sensory deficit.     Motor: No weakness.     Coordination: Coordination normal.     Gait: Gait abnormal.  Psychiatric:        Mood and Affect: Affect is labile.        Speech: Speech normal.        Behavior: Behavior normal.        Thought Content: Thought content is delusional.        Judgment: Judgment normal.    Labs reviewed: Recent Labs    09/12/20 1716 11/17/20 1209 12/24/20 1019  NA 139 139 139  K 3.6 4.2 4.5  CL 106 106 104  CO2 23 20 27   GLUCOSE 88 76 104*  BUN 20 17 18   CREATININE 1.01* 0.86 0.86  CALCIUM 9.0 9.8 9.7   Recent Labs    11/17/20 1209 12/24/20 1019  AST 22 17  ALT 17 13  BILITOT 0.6 0.5  PROT 7.1 7.0   Recent Labs    11/17/20 1209 12/19/20 1337 12/24/20 1019  WBC 5.0 5.7 5.3  NEUTROABS 2,825  --  3,636  HGB 14.2 13.4 14.3  HCT 44.2 41.0 44.7  MCV 88.9 89.5 89.0  PLT 361 278 299   Lab Results  Component Value Date   TSH 1.51 11/17/2020   No results found for: HGBA1C Lab Results  Component Value Date   CHOL 164 11/17/2020   HDL 78 11/17/2020   LDLCALC 72 11/17/2020   TRIG 49 11/17/2020   CHOLHDL 2.1 11/17/2020    Significant Diagnostic Results in last 30 days:  CT HEAD WO CONTRAST (01/17/2021)  Result Date: 03/27/2021 CLINICAL DATA:  70 year old female with history of trauma from a fall. Head and neck pain. EXAM: CT HEAD WITHOUT CONTRAST CT CERVICAL SPINE WITHOUT CONTRAST TECHNIQUE: Multidetector CT imaging of the head and cervical spine was performed following the standard protocol without intravenous contrast. Multiplanar CT  image reconstructions of the cervical spine were also generated. COMPARISON:  No priors. FINDINGS: CT HEAD FINDINGS Brain: Patchy and confluent areas of decreased attenuation are noted throughout the deep and periventricular white matter of the cerebral hemispheres bilaterally, compatible with chronic microvascular ischemic disease. No evidence of acute infarction, hemorrhage, hydrocephalus, extra-axial collection or mass lesion/mass effect. Vascular: No hyperdense vessel or unexpected calcification. Skull: Normal. Negative for fracture or focal lesion. Sinuses/Orbits: No acute finding. Other: None. CT CERVICAL SPINE FINDINGS Alignment: Reversal of normal cervical lordosis, most severe at C3, likely chronic. 3 mm of anterolisthesis of C2 upon C3, also favored to be chronic related to degenerative disease. Skull base and vertebrae: Status post ACDF at C5-C6. Complete bony fusion from C3-C7. No acute fracture. No primary bone lesion or focal pathologic process. Soft tissues and spinal canal: No prevertebral fluid or swelling. No visible canal hematoma. Disc levels: Severe multilevel degenerative disc disease with complete bony fusion from C3-C7. Severe multilevel facet arthropathy. Upper chest: Interstitial prominence in the visualized lung apices, similar to the prior study from 07/26/2018. 2 mm right upper lobe pulmonary nodule unchanged compared to the 07/26/2018, considered definitively benign. Other: None. IMPRESSION: 1. No evidence  of significant acute traumatic injury to the skull, brain or cervical spine. 2. Chronic microvascular ischemic changes in the cerebral white matter, as above. 3. Severe multilevel degenerative disc disease and cervical spondylosis, as above, with postoperative changes of ACDF at C5-C6. Electronically Signed   By: Trudie Reed M.D.   On: 03/27/2021 14:48   CT CERVICAL SPINE WO CONTRAST  Result Date: 03/27/2021 CLINICAL DATA:  70 year old female with history of trauma from a  fall. Head and neck pain. EXAM: CT HEAD WITHOUT CONTRAST CT CERVICAL SPINE WITHOUT CONTRAST TECHNIQUE: Multidetector CT imaging of the head and cervical spine was performed following the standard protocol without intravenous contrast. Multiplanar CT image reconstructions of the cervical spine were also generated. COMPARISON:  No priors. FINDINGS: CT HEAD FINDINGS Brain: Patchy and confluent areas of decreased attenuation are noted throughout the deep and periventricular white matter of the cerebral hemispheres bilaterally, compatible with chronic microvascular ischemic disease. No evidence of acute infarction, hemorrhage, hydrocephalus, extra-axial collection or mass lesion/mass effect. Vascular: No hyperdense vessel or unexpected calcification. Skull: Normal. Negative for fracture or focal lesion. Sinuses/Orbits: No acute finding. Other: None. CT CERVICAL SPINE FINDINGS Alignment: Reversal of normal cervical lordosis, most severe at C3, likely chronic. 3 mm of anterolisthesis of C2 upon C3, also favored to be chronic related to degenerative disease. Skull base and vertebrae: Status post ACDF at C5-C6. Complete bony fusion from C3-C7. No acute fracture. No primary bone lesion or focal pathologic process. Soft tissues and spinal canal: No prevertebral fluid or swelling. No visible canal hematoma. Disc levels: Severe multilevel degenerative disc disease with complete bony fusion from C3-C7. Severe multilevel facet arthropathy. Upper chest: Interstitial prominence in the visualized lung apices, similar to the prior study from 07/26/2018. 2 mm right upper lobe pulmonary nodule unchanged compared to the 07/26/2018, considered definitively benign. Other: None. IMPRESSION: 1. No evidence of significant acute traumatic injury to the skull, brain or cervical spine. 2. Chronic microvascular ischemic changes in the cerebral white matter, as above. 3. Severe multilevel degenerative disc disease and cervical spondylosis, as  above, with postoperative changes of ACDF at C5-C6. Electronically Signed   By: Trudie Reed M.D.   On: 03/27/2021 14:48   DG Knee Complete 4 Views Right  Result Date: 03/30/2021 CLINICAL DATA:  Right knee pain. EXAM: RIGHT KNEE - COMPLETE 4+ VIEW COMPARISON:  None. FINDINGS: No fracture or bone lesion. Knee joint is normally spaced and aligned. No significant degenerative/arthropathic changes. Moderate joint effusion. Scattered posterior arterial atherosclerotic calcifications. Soft tissues otherwise unremarkable. IMPRESSION: 1. No fracture or bone lesion. No significant degenerative/arthropathic changes. 2. Nonspecific moderate suprapatellar joint effusion. Electronically Signed   By: Amie Portland M.D.   On: 03/30/2021 18:30    Assessment/Plan 1. Dysphagia, unspecified type Reports spitting up/vomit unable to swallow her food get 's stuck mid chest. - Ambulatory referral to Gastroenterology  2. Chronic vomiting Continue on zofran  - ondansetron (ZOFRAN) 4 MG tablet; Take 1 tablet (4 mg total) by mouth every 8 (eight) hours as needed for nausea or vomiting.  Dispense: 20 tablet; Refill: 1  3. Gastroesophageal reflux disease, unspecified whether esophagitis present Recommended omeprazole but declined.  - ondansetron (ZOFRAN) 4 MG tablet; Take 1 tablet (4 mg total) by mouth every 8 (eight) hours as needed for nausea or vomiting.  Dispense: 20 tablet; Refill: 1  4. Moderate episode of recurrent major depressive disorder (HCC) Has stopped lexapro without weaning off states has been on it for many years.Had a long discussion  with her cautioning to avoid sudden stopping of antidepressant to prevent withdrawal symptoms.difficult to redirect.will start on sertraline as below.side effects discussed.will recommend Psychiatry evaluation if symptoms not controlled.   - sertraline (ZOLOFT) 25 MG tablet; Take 1 tablet (25 mg total) by mouth daily.  Dispense: 30 tablet; Refill: 1  5. Effusion of joint,  unspecified location Right knee pain worsening.X-ray indicated moderate suprapatellar joint effusion.refer to Orthopedic for evaluation.   6. Skin lesion of scalp No palpable lesion noted on scalp but insist lesion getting worst from parasite infestation wants dermatology evaluation.  - Ambulatory referral to Dermatology   Family/ staff Communication: Reviewed plan of care with patient  Labs/tests ordered: None   Next Appointment: Has appointment in place   Caesar Bookman, NP

## 2021-04-02 ENCOUNTER — Other Ambulatory Visit: Payer: Self-pay | Admitting: Family

## 2021-04-02 DIAGNOSIS — F331 Major depressive disorder, recurrent, moderate: Secondary | ICD-10-CM

## 2021-04-06 ENCOUNTER — Encounter: Payer: Self-pay | Admitting: Gastroenterology

## 2021-04-06 ENCOUNTER — Ambulatory Visit: Payer: Medicare Other | Admitting: Orthopedic Surgery

## 2021-04-15 ENCOUNTER — Other Ambulatory Visit: Payer: Self-pay | Admitting: *Deleted

## 2021-04-15 DIAGNOSIS — K219 Gastro-esophageal reflux disease without esophagitis: Secondary | ICD-10-CM

## 2021-04-15 DIAGNOSIS — R1110 Vomiting, unspecified: Secondary | ICD-10-CM

## 2021-04-15 MED ORDER — ONDANSETRON HCL 4 MG PO TABS: 4.0000 mg | ORAL_TABLET | Freq: Three times a day (TID) | ORAL | 1 refills | Status: DC | PRN

## 2021-04-15 NOTE — Telephone Encounter (Signed)
Patient called and stated that her kids threw away her bottle of Zofran. They though the bottle was empty and threw it away.   Patient is needing medication and requested refill to be sent to [facility]. Requesting it before lunch time because she had an appointment with a lawyer.   Pended Rx and sent to [facility] for approval.

## 2021-04-16 ENCOUNTER — Encounter: Payer: Self-pay | Admitting: Orthopedic Surgery

## 2021-04-16 ENCOUNTER — Ambulatory Visit (INDEPENDENT_AMBULATORY_CARE_PROVIDER_SITE_OTHER): Payer: Medicare Other | Admitting: Orthopedic Surgery

## 2021-04-16 ENCOUNTER — Ambulatory Visit: Payer: Medicare Other | Admitting: Adult Health

## 2021-04-16 ENCOUNTER — Other Ambulatory Visit: Payer: Self-pay

## 2021-04-16 VITALS — BP 136/90 | HR 84 | Temp 97.9°F

## 2021-04-16 DIAGNOSIS — R1110 Vomiting, unspecified: Secondary | ICD-10-CM

## 2021-04-16 DIAGNOSIS — E039 Hypothyroidism, unspecified: Secondary | ICD-10-CM

## 2021-04-16 DIAGNOSIS — R509 Fever, unspecified: Secondary | ICD-10-CM | POA: Diagnosis not present

## 2021-04-16 DIAGNOSIS — F419 Anxiety disorder, unspecified: Secondary | ICD-10-CM

## 2021-04-16 DIAGNOSIS — R110 Nausea: Secondary | ICD-10-CM | POA: Diagnosis not present

## 2021-04-16 DIAGNOSIS — R1310 Dysphagia, unspecified: Secondary | ICD-10-CM

## 2021-04-16 MED ORDER — METOCLOPRAMIDE HCL 5 MG PO TABS: 5.0000 mg | ORAL_TABLET | Freq: Two times a day (BID) | ORAL | 1 refills | Status: DC

## 2021-04-16 NOTE — Patient Instructions (Addendum)
Please take Reglan 30 minutes to 1 hour prior to eating.   If you cannot keep fluids or foods down within 24 hours please report to ED for further evaluation.   Continue GI consult 05/05/2021 at 10:30

## 2021-04-16 NOTE — Progress Notes (Signed)
Careteam: Patient Care Team: Ngetich, Donalee Citrin, NP as PCP - General (Family Medicine)  Seen by: Hazle Nordmann, AGNP-C  PLACE OF SERVICE:  [facility]  Advanced Directive information    Allergies  Allergen Reactions   Amoxicillin-Pot Clavulanate Nausea And Vomiting and Other (See Comments)   Clarithromycin Other (See Comments)    DOES NOT REMEMBER REACTION DOES NOT REMEMBER REACTION    Fentanyl Nausea And Vomiting and Other (See Comments)    FENTANYL PATCH FENTANYL PATCH FENTANYL PATCH    Levofloxacin Swelling    Face,eye swelling Face,eye swelling    Lorazepam Other (See Comments)    DO NOT PUT ANY ATIVAN ON PATIENT'S IV POST SURGERY! DO NOT PUT ANY ATIVAN ON PATIENT'S IV POST SURGERY!    Doxycycline Nausea And Vomiting   Penicillins Hives   Sulfa Antibiotics Other (See Comments)    Per pt: unknown    Chief Complaint  Patient presents with   Acute Visit    Weakness, vomiting and fever off and on (more on than off), chills and hot flashes. Not eating x 48 hours and sips ginger ale. "Wicked headache"     HPI: Patient is a 70 y.o. female seen today for acute visit due to weakness, vomiting and hot flashes.   Family member present during encounter.   She began vomiting about a week ago. Reports there are parasites in her vomit. She states she has been diagnosed with parasites in 2015. She brought documentation from [employer] today. She reports veterinary service had to do test since [facility] could not. Microscopic stool analysis states it was negative for protozoa and positive for tapeworms, eggs and larva. She has only been able to keep down ginger ale today. She tried to eat yogurt and vomited contents a short time after.She provided pictures of her vomit and stool. She believes white specks in pictures represent worms. Zofran does not help prevent nausea. LBM 09/28. In addition, she is having trouble swallowing. She is scheduled to see GI 10/18. Besides  abdominal issues, she reports intermittent fevers within the last few days. Describes them as hot flashes, she cannot recall recent temperatures at home. Other cold symptoms include chills and headaches. Denies sore throat, loss of taste/smell or nasal congestion. She has not performed a covid test at home. She has not been in contact with any sick persons.   08/18 she fell at home. She hit her head and bruised elbows. She did not report to ED. CT head ordered by provider, no evidence of acute injury, chronic microvascular ischemic changes in cerebral white matter noted. She ambulates with wheelchair at this time. No recent falls.   She had also stopped taking her Lexapro awhile back. Zoloft started 09/14. She appears anxious today. She denies increased anxiety or panic attacks.   She has had initial covid vaccinations, no booster.   Review of Systems:  Review of Systems  Constitutional:  Negative for chills, fever and malaise/fatigue.  HENT:  Negative for congestion and sore throat.        Trouble swallowing  Respiratory:  Negative for cough, shortness of breath and wheezing.   Cardiovascular:  Negative for chest pain and leg swelling.  Gastrointestinal:  Positive for abdominal pain, constipation, nausea and vomiting. Negative for blood in stool and heartburn.  Genitourinary:  Negative for dysuria.  Musculoskeletal:  Positive for back pain and falls.  Neurological:  Positive for weakness. Negative for dizziness and tingling.  Psychiatric/Behavioral:  Negative for depression.  The patient is nervous/anxious.    Past Medical History:  Diagnosis Date   ADHD    Anemia    Cellulitis 07/2018   left lower extremity   Chronic kidney disease    surgery to fix   Chronic pain    Depression    major depression   DVT (deep venous thrombosis) (HCC)    Ekbom's delusional parasitosis (HCC)    Fibromyalgia    History of Zenker's diverticulum removal    Lyme disease    Migraine with aura     Multiple falls    Thyroid disease    hypothyroid   Past Surgical History:  Procedure Laterality Date   APPENDECTOMY  1974   Avascular necrosis of the hip     multiple operations starting in 2005.  bilateral hips   Back fusion  1999   CERVICAL FUSION  3235,5732   EYE SURGERY     eye muscle   KIDNEY SURGERY  1971   kidney reconstruction   LAMINECTOMY  1985   TOTAL HIP ARTHROPLASTY     Social History:   reports that she has never smoked. She has never used smokeless tobacco. She reports that she does not currently use alcohol. She reports that she does not use drugs.  Family History  Problem Relation Age of Onset   Cancer Sister    Hemochromatosis Sister    Cancer Mother        cancr of unknown origin   Colon cancer Neg Hx    Stomach cancer Neg Hx    Esophageal cancer Neg Hx     Medications: Patient's Medications  New Prescriptions   No medications on file  Previous Medications   BETAMETHASONE VALERATE OINTMENT (VALISONE) 0.1 %    Apply 1 application topically 2 (two) times daily. Use for 2 weeks for a flare.  Apply twice a week at bedtime for maintenance dosing.   BUPROPION HCL ER, XL, 450 MG TB24    TAKE ONE TABLET EACH DAY   CYCLOBENZAPRINE (FLEXERIL) 5 MG TABLET    Take 1 tablet (5 mg total) by mouth daily as needed for muscle spasms. Take 1 tablet daily and as needed for muscle spasms   CYCLOBENZAPRINE (FLEXERIL) 5 MG TABLET    Take 5 mg by mouth in the morning, at noon, and at bedtime.   DICLOFENAC SODIUM CR 100 MG 24 HR TABLET    Take 100 mg by mouth 2 (two) times daily.   METHOCARBAMOL (ROBAXIN) 500 MG TABLET    Take 1 tablet (500 mg total) by mouth as needed for muscle spasms. 1-2 tablets PRN   NP THYROID 60 MG TABLET    TAKE ONE TABLET EACH DAY BEFORE BREAKFAST   ONDANSETRON (ZOFRAN) 4 MG TABLET    Take 1 tablet (4 mg total) by mouth every 8 (eight) hours as needed for nausea or vomiting.   RITALIN 5 MG TABLET    Take 1 tablet (5 mg total) by mouth daily as needed.    RITALIN LA 20 MG 24 HR CAPSULE    TAKE ONE CAPSULE EACH DAY   SERTRALINE (ZOLOFT) 25 MG TABLET    Take 1 tablet (25 mg total) by mouth daily.   TOPIRAMATE (TOPAMAX) 100 MG TABLET    Take 100 mg by mouth as needed.  Modified Medications   No medications on file  Discontinued Medications   No medications on file    Physical Exam:  Vitals:   04/16/21 1334  BP: 136/90  Pulse: 84  Temp: 97.9 F (36.6 C)  TempSrc: Temporal  SpO2: 96%   There is no height or weight on file to calculate BMI. Wt Readings from Last 3 Encounters:  04/01/21 129 lb 6.4 oz (58.7 kg)  03/05/21 130 lb 3.2 oz (59.1 kg)  01/01/21 127 lb 3.2 oz (57.7 kg)    Physical Exam Vitals reviewed.  Constitutional:      General: She is not in acute distress. HENT:     Head: Normocephalic.  Eyes:     General:        Right eye: No discharge.        Left eye: No discharge.  Neck:     Thyroid: No thyroid mass or thyromegaly.     Vascular: No carotid bruit.  Cardiovascular:     Rate and Rhythm: Normal rate and regular rhythm.     Pulses: Normal pulses.     Heart sounds: Normal heart sounds. No murmur heard. Pulmonary:     Effort: Pulmonary effort is normal. No respiratory distress.     Breath sounds: Normal breath sounds. No wheezing.  Abdominal:     General: Bowel sounds are normal. There is no distension.     Palpations: Abdomen is soft. There is no mass.     Tenderness: There is abdominal tenderness. There is no guarding or rebound.     Hernia: No hernia is present.     Comments: LUQ, epigastric tenderness on exam  Musculoskeletal:     Cervical back: Normal range of motion.     Right lower leg: No edema.     Left lower leg: No edema.  Lymphadenopathy:     Cervical: No cervical adenopathy.  Skin:    General: Skin is warm and dry.     Capillary Refill: Capillary refill takes less than 2 seconds.  Neurological:     General: No focal deficit present.     Mental Status: She is alert and oriented to  person, place, and time.     Motor: Weakness present.     Gait: Gait abnormal.     Comments: wheelchair  Psychiatric:        Mood and Affect: Mood normal.        Behavior: Behavior normal.    Labs reviewed: Basic Metabolic Panel: Recent Labs    09/12/20 1716 11/17/20 1209 12/24/20 1019  NA 139 139 139  K 3.6 4.2 4.5  CL 106 106 104  CO2 23 20 27   GLUCOSE 88 76 104*  BUN 20 17 18   CREATININE 1.01* 0.86 0.86  CALCIUM 9.0 9.8 9.7  TSH  --  1.51  --    Liver Function Tests: Recent Labs    11/17/20 1209 12/24/20 1019  AST 22 17  ALT 17 13  BILITOT 0.6 0.5  PROT 7.1 7.0   No results for input(s): LIPASE, AMYLASE in the last 8760 hours. No results for input(s): AMMONIA in the last 8760 hours. CBC: Recent Labs    11/17/20 1209 12/19/20 1337 12/24/20 1019  WBC 5.0 5.7 5.3  NEUTROABS 2,825  --  3,636  HGB 14.2 13.4 14.3  HCT 44.2 41.0 44.7  MCV 88.9 89.5 89.0  PLT 361 278 299   Lipid Panel: Recent Labs    11/17/20 1209  CHOL 164  HDL 78  LDLCALC 72  TRIG 49  CHOLHDL 2.1   TSH: Recent Labs    11/17/20 1209  TSH 1.51   A1C: No results found for: HGBA1C  Assessment/Plan 1. Fever, intermittent - temp 97.9 in office - SARS-COV-2 RNA,(COVID-19) QUAL NAAT - CBC with Differential/Platelet  2. Chronic vomiting - ongoing, unable to eat at this time - reports tapeworms in vomit, picture provided vague - will start reglan today  - advised to take reglan for 30-60 min prior to eating - advised to report to ED if she is unable to eat or drink for 24 hours after starting new medication - recommend bland foods and oral hydration with water - CMP - CBC with Differential/Platelet  3. Acquired hypothyroidism - TSH 1.51 11/2020 - cont NP thyroid - TSH  4. Nausea - ongoing, suspect due to empty stomach - metoCLOPramide (REGLAN) 5 MG tablet; Take 1 tablet (5 mg total) by mouth in the morning and at bedtime.  Dispense: 60 tablet; Refill: 1  5.  Dysphagia, unspecified type - ongoing - upper EGD with dilation 2015- esophageal stricture and gastric ulcers noted - past diagnosis of  Zenkers diverticulum- barium swallow indicated esophageal dysmotility disorder - recommend sitting upright when eating - recommend small bites and eating slowly - GI consult 10/18  6. Anxiety - appears anxious today - started zoloft 09/14  Total time: 35 minutes. Greater than 50 % of total time spent doing patient education on symptom management and medication management.    Next appt: none Hailei Besser Scherry Ran  [facility] [phone]

## 2021-04-17 LAB — CBC WITH DIFFERENTIAL/PLATELET
Absolute Monocytes: 455 cells/uL (ref 200–950)
Basophils Absolute: 52 cells/uL (ref 0–200)
Basophils Relative: 0.8 %
Eosinophils Absolute: 163 cells/uL (ref 15–500)
Eosinophils Relative: 2.5 %
HCT: 43.2 % (ref 35.0–45.0)
Hemoglobin: 14.4 g/dL (ref 11.7–15.5)
Lymphs Abs: 1151 cells/uL (ref 850–3900)
MCH: 29.7 pg (ref 27.0–33.0)
MCHC: 33.3 g/dL (ref 32.0–36.0)
MCV: 89.1 fL (ref 80.0–100.0)
MPV: 10.2 fL (ref 7.5–12.5)
Monocytes Relative: 7 %
Neutro Abs: 4680 cells/uL (ref 1500–7800)
Neutrophils Relative %: 72 %
Platelets: 308 10*3/uL (ref 140–400)
RBC: 4.85 10*6/uL (ref 3.80–5.10)
RDW: 13.1 % (ref 11.0–15.0)
Total Lymphocyte: 17.7 %
WBC: 6.5 10*3/uL (ref 3.8–10.8)

## 2021-04-17 LAB — COMPREHENSIVE METABOLIC PANEL
AG Ratio: 1.8 (calc) (ref 1.0–2.5)
ALT: 13 U/L (ref 6–29)
AST: 15 U/L (ref 10–35)
Albumin: 4.4 g/dL (ref 3.6–5.1)
Alkaline phosphatase (APISO): 85 U/L (ref 37–153)
BUN: 15 mg/dL (ref 7–25)
CO2: 27 mmol/L (ref 20–32)
Calcium: 9.6 mg/dL (ref 8.6–10.4)
Chloride: 104 mmol/L (ref 98–110)
Creat: 0.77 mg/dL (ref 0.60–1.00)
Globulin: 2.4 g/dL (calc) (ref 1.9–3.7)
Glucose, Bld: 92 mg/dL (ref 65–139)
Potassium: 4.4 mmol/L (ref 3.5–5.3)
Sodium: 139 mmol/L (ref 135–146)
Total Bilirubin: 0.5 mg/dL (ref 0.2–1.2)
Total Protein: 6.8 g/dL (ref 6.1–8.1)

## 2021-04-17 LAB — SARS-COV-2 RNA,(COVID-19) QUALITATIVE NAAT: SARS CoV2 RNA: NOT DETECTED

## 2021-04-17 LAB — TSH: TSH: 1.51 mIU/L (ref 0.40–4.50)

## 2021-04-21 ENCOUNTER — Other Ambulatory Visit: Payer: Self-pay

## 2021-04-21 ENCOUNTER — Ambulatory Visit (INDEPENDENT_AMBULATORY_CARE_PROVIDER_SITE_OTHER): Payer: Medicare Other | Admitting: Family

## 2021-04-21 ENCOUNTER — Encounter: Payer: Self-pay | Admitting: Family

## 2021-04-21 VITALS — BP 132/78 | HR 81 | Temp 97.3°F | Ht 60.0 in | Wt 128.0 lb

## 2021-04-21 DIAGNOSIS — G8929 Other chronic pain: Secondary | ICD-10-CM

## 2021-04-21 DIAGNOSIS — M545 Low back pain, unspecified: Secondary | ICD-10-CM | POA: Diagnosis not present

## 2021-04-21 DIAGNOSIS — B89 Unspecified parasitic disease: Secondary | ICD-10-CM | POA: Diagnosis not present

## 2021-04-21 DIAGNOSIS — R110 Nausea: Secondary | ICD-10-CM | POA: Diagnosis not present

## 2021-04-21 NOTE — Progress Notes (Signed)
Provider: Richarda Blade FNP-C  Kasarah Sitts, Donalee Citrin, NP  Patient Care Team: Suhailah Kwan, Donalee Citrin, NP as PCP - General (Family Medicine)  Extended Emergency Contact Information Primary Emergency Contact: Juanda Chance [place] of [place] Mobile Phone: [phone] Relation: Son Secondary Emergency Contact: [name],[name],[name] Home Phone: [phone] Relation: Friend  Code Status:  Full code  Goals of care: Advanced Directive information Advanced Directives 04/21/2021  Does Patient Have a Medical Advance Directive? No  Does patient want to make changes to medical advance directive? -  Would patient like information on creating a medical advance directive? No - Patient declined     Chief Complaint  Patient presents with   Follow-up    5 day follow-up on vomiting, nausea, and anxiety.     HPI:  Pt is a 70 y.o. female seen today for an acute visit for 5 days follow up nausea,vomiting and anxiety.she was seen here by Dionicia Abler States feeling much better.continues to require Zofran for nausea.states did not take Reglan as advised by [place],NP. She denies any fever,chills or abdominal pain. She is more concerns of her ongoing issues with Parasite "all over the body"  and chronic hip/pelvic pain.states unable to follow up with surgery until to be done by Her Orthopedic at [facility] Dr.[name] Noel Gerold states need treatment for parasite infection first.states unable to participate on her illegal issues for a company who defraud her for her house. 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 She request referral to [facility] infectious disease for evaluation of parasite.Brought addition pictures of her stool that she thinks has parasites.she was previous seen by previous ID was diagnosed with Ekbom's delusional parasitosis.she believes she was infected by her previous Husband. Continue to follow up with pain management specialist    Past Medical History:  Diagnosis Date   ADHD    Anemia    Cellulitis 07/2018   left lower extremity   Chronic kidney disease    surgery to fix   Chronic pain    Depression    major depression   DVT (deep venous thrombosis) (HCC)    Ekbom's delusional parasitosis (HCC)    Fibromyalgia    History of Zenker's diverticulum removal    Lyme disease    Migraine with aura    Multiple falls    Thyroid disease    hypothyroid   Past Surgical History:  Procedure Laterality Date   APPENDECTOMY  1974   Avascular necrosis of the hip     multiple operations starting in 2005.  bilateral hips   Back fusion  1999   CERVICAL FUSION  5027,7412   EYE SURGERY     eye muscle   KIDNEY SURGERY  1971   kidney reconstruction   LAMINECTOMY  1985   TOTAL HIP ARTHROPLASTY      Allergies  Allergen Reactions   Amoxicillin-Pot Clavulanate Nausea And Vomiting and Other (See Comments)   Clarithromycin Other (See Comments)    DOES NOT REMEMBER REACTION DOES NOT REMEMBER REACTION  Fentanyl Nausea And Vomiting and Other (See Comments)    FENTANYL PATCH FENTANYL PATCH FENTANYL PATCH    Levofloxacin Swelling    Face,eye swelling Face,eye swelling    Lorazepam Other (See Comments)    DO NOT PUT ANY ATIVAN ON PATIENT'S IV POST SURGERY! DO NOT PUT ANY ATIVAN ON PATIENT'S IV POST SURGERY!    Doxycycline Nausea And Vomiting   Penicillins Hives   Sulfa Antibiotics Other (See Comments)    Per pt: unknown    Outpatient Encounter Medications as of 04/21/2021   Medication Sig   betamethasone valerate ointment (VALISONE) 0.1 % Apply 1 application topically 2 (two) times daily. Use for 2 weeks for a flare.  Apply twice a week at bedtime for maintenance dosing.   buPROPion HCl ER, XL, 450 MG TB24 TAKE ONE TABLET EACH DAY   cyclobenzaprine (FLEXERIL) 5 MG tablet Take 1 tablet (5 mg total) by mouth daily as needed for muscle spasms. Take 1 tablet daily and as needed for muscle spasms   cyclobenzaprine (FLEXERIL) 5 MG tablet Take 5 mg by mouth in the morning, at noon, and at bedtime.   Diclofenac Sodium CR 100 MG 24 hr tablet Take 100 mg by mouth 2 (two) times daily.   methocarbamol (ROBAXIN) 500 MG tablet Take 1 tablet (500 mg total) by mouth as needed for muscle spasms. 1-2 tablets PRN   metoCLOPramide (REGLAN) 5 MG tablet Take 1 tablet (5 mg total) by mouth in the morning and at bedtime.   NP THYROID 60 MG tablet TAKE ONE TABLET EACH DAY BEFORE BREAKFAST   ondansetron (ZOFRAN) 4 MG tablet Take 1 tablet (4 mg total) by mouth every 8 (eight) hours as needed for nausea or vomiting.   RITALIN 5 MG tablet Take 1 tablet (5 mg total) by mouth daily as needed.   RITALIN LA 20 MG 24 hr capsule TAKE ONE CAPSULE EACH DAY   sertraline (ZOLOFT) 25 MG tablet Take 1 tablet (25 mg total) by mouth daily.   topiramate (TOPAMAX) 100 MG tablet Take 100 mg by mouth as needed.   No facility-administered encounter medications on file as of 04/21/2021.    Review of Systems  Constitutional:  Negative for appetite change, chills, fatigue, fever and unexpected weight change.  HENT:  Negative for congestion, ear discharge, ear pain, hearing loss, nosebleeds, postnasal drip, rhinorrhea, sinus pressure, sinus pain, sneezing and sore throat.   Eyes:  Negative for pain, discharge, redness, itching and visual disturbance.  Respiratory:  Negative for cough, chest tightness, shortness of breath and wheezing.   Cardiovascular:  Negative for chest pain, palpitations and leg swelling.   Gastrointestinal:  Negative for abdominal distention, abdominal pain, blood in stool, constipation and diarrhea.       Chronic nausea.vomiting has resolved   Musculoskeletal:  Positive for arthralgias and gait problem. Negative for back pain, joint swelling, myalgias, neck pain and neck stiffness.  Skin:  Negative for color change, pallor, rash and wound.  Neurological:  Negative for dizziness, syncope, speech difficulty, weakness, light-headedness and headaches.       Chronic leg numbness   Hematological:  Does not bruise/bleed easily.  Psychiatric/Behavioral:  Negative for agitation, behavioral problems, confusion, hallucinations, sleep disturbance and suicidal ideas. The patient is not nervous/anxious.    Immunization History  Administered Date(s) Administered   Influenza, High Dose Seasonal PF 06/09/2018, 09/07/2019   PFIZER(Purple Top)SARS-COV-2 Vaccination 11/19/2019, 02/12/2020   Pneumococcal Conjugate-13 08/22/2015   Pneumococcal Polysaccharide-23 11/10/2020   Pertinent  Health Maintenance  Due  Topic Date Due   MAMMOGRAM  09/08/2013   DEXA SCAN  Never done   INFLUENZA VACCINE  02/16/2021   COLONOSCOPY (Pts 45-40yrs Insurance coverage will need to be confirmed)  12/01/2021   Fall Risk  03/11/2021 03/05/2021 03/04/2021 01/01/2021 12/24/2020  Falls in the past year? 1 1 0 0 1  Comment - - - - pain in R hip from fall last week.  Number falls in past yr: 1 1 0 0 1  Injury with Fall? 1 1 0 0 1  Risk for fall due to : History of fall(s) History of fall(s) No Fall Risks No Fall Risks -  Follow up Falls evaluation completed Falls evaluation completed Falls evaluation completed - -   Functional Status Survey:    Vitals:   04/21/21 1322  BP: 132/78  Pulse: 81  Temp: (!) 97.3 F (36.3 C)  TempSrc: Temporal  SpO2: 97%  Weight: 128 lb (58.1 kg)  Height: 5' (1.524 m)   Body mass index is 25 kg/m. Physical Exam Vitals reviewed.  Constitutional:      General: She is not in acute  distress.    Appearance: Normal appearance. She is overweight. She is not ill-appearing or diaphoretic.  HENT:     Head: Normocephalic.     Right Ear: Tympanic membrane, ear canal and external ear normal. There is no impacted cerumen.     Left Ear: Tympanic membrane, ear canal and external ear normal. There is no impacted cerumen.     Nose: Nose normal. No congestion or rhinorrhea.     Mouth/Throat:     Mouth: Mucous membranes are moist.     Pharynx: Oropharynx is clear. No oropharyngeal exudate or posterior oropharyngeal erythema.  Eyes:     General: No scleral icterus.       Right eye: No discharge.        Left eye: No discharge.     Extraocular Movements: Extraocular movements intact.     Conjunctiva/sclera: Conjunctivae normal.     Pupils: Pupils are equal, round, and reactive to light.  Neck:     Vascular: No carotid bruit.  Cardiovascular:     Rate and Rhythm: Normal rate and regular rhythm.     Pulses: Normal pulses.     Heart sounds: Normal heart sounds. No murmur heard.   No friction rub. No gallop.  Pulmonary:     Effort: Pulmonary effort is normal. No respiratory distress.     Breath sounds: Normal breath sounds. No wheezing, rhonchi or rales.  Chest:     Chest wall: No tenderness.  Abdominal:     General: Bowel sounds are normal. There is no distension.     Palpations: Abdomen is soft. There is no mass.     Tenderness: There is no abdominal tenderness. There is no right CVA tenderness, left CVA tenderness, guarding or rebound.  Musculoskeletal:        General: No swelling or tenderness.     Cervical back: Normal range of motion. No rigidity or tenderness.     Right lower leg: No edema.     Left lower leg: No edema.     Comments: Unsteady gait  Lymphadenopathy:     Cervical: No cervical adenopathy.  Skin:    General: Skin is warm and dry.     Coloration: Skin is not pale.     Findings: No bruising, erythema, lesion or rash.  Neurological:     Mental Status:  She is alert.  Mental status is at baseline.     Motor: No weakness.     Gait: Gait abnormal.  Psychiatric:        Mood and Affect: Mood normal.        Speech: Speech normal.        Behavior: Behavior normal.    Labs reviewed: Recent Labs    11/17/20 1209 12/24/20 1019 04/16/21 1415  NA 139 139 139  K 4.2 4.5 4.4  CL 106 104 104  CO2 20 27 27   GLUCOSE 76 104* 92  BUN 17 18 15   CREATININE 0.86 0.86 0.77  CALCIUM 9.8 9.7 9.6   Recent Labs    11/17/20 1209 12/24/20 1019 04/16/21 1415  AST 22 17 15   ALT 17 13 13   BILITOT 0.6 0.5 0.5  PROT 7.1 7.0 6.8   Recent Labs    11/17/20 1209 12/19/20 1337 12/24/20 1019 04/16/21 1415  WBC 5.0 5.7 5.3 6.5  NEUTROABS 2,825  --  3,636 4,680  HGB 14.2 13.4 14.3 14.4  HCT 44.2 41.0 44.7 43.2  MCV 88.9 89.5 89.0 89.1  PLT 361 278 299 308   Lab Results  Component Value Date   TSH 1.51 04/16/2021   No results found for: HGBA1C Lab Results  Component Value Date   CHOL 164 11/17/2020   HDL 78 11/17/2020   LDLCALC 72 11/17/2020   TRIG 49 11/17/2020   CHOLHDL 2.1 11/17/2020    Significant Diagnostic Results in last 30 days:  CT HEAD WO CONTRAST (01/17/2021)  Result Date: 03/27/2021 CLINICAL DATA:  70 year old female with history of trauma from a fall. Head and neck pain. EXAM: CT HEAD WITHOUT CONTRAST CT CERVICAL SPINE WITHOUT CONTRAST TECHNIQUE: Multidetector CT imaging of the head and cervical spine was performed following the standard protocol without intravenous contrast. Multiplanar CT image reconstructions of the cervical spine were also generated. COMPARISON:  No priors. FINDINGS: CT HEAD FINDINGS Brain: Patchy and confluent areas of decreased attenuation are noted throughout the deep and periventricular white matter of the cerebral hemispheres bilaterally, compatible with chronic microvascular ischemic disease. No evidence of acute infarction, hemorrhage, hydrocephalus, extra-axial collection or mass lesion/mass effect. Vascular:  No hyperdense vessel or unexpected calcification. Skull: Normal. Negative for fracture or focal lesion. Sinuses/Orbits: No acute finding. Other: None. CT CERVICAL SPINE FINDINGS Alignment: Reversal of normal cervical lordosis, most severe at C3, likely chronic. 3 mm of anterolisthesis of C2 upon C3, also favored to be chronic related to degenerative disease. Skull base and vertebrae: Status post ACDF at C5-C6. Complete bony fusion from C3-C7. No acute fracture. No primary bone lesion or focal pathologic process. Soft tissues and spinal canal: No prevertebral fluid or swelling. No visible canal hematoma. Disc levels: Severe multilevel degenerative disc disease with complete bony fusion from C3-C7. Severe multilevel facet arthropathy. Upper chest: Interstitial prominence in the visualized lung apices, similar to the prior study from 07/26/2018. 2 mm right upper lobe pulmonary nodule unchanged compared to the 07/26/2018, considered definitively benign. Other: None. IMPRESSION: 1. No evidence of significant acute traumatic injury to the skull, brain or cervical spine. 2. Chronic microvascular ischemic changes in the cerebral white matter, as above. 3. Severe multilevel degenerative disc disease and cervical spondylosis, as above, with postoperative changes of ACDF at C5-C6. Electronically Signed   By: 05/27/2021 M.D.   On: 03/27/2021 14:48   CT CERVICAL SPINE WO CONTRAST  Result Date: 03/27/2021 CLINICAL DATA:  70 year old female with history of trauma from a fall. Head and  neck pain. EXAM: CT HEAD WITHOUT CONTRAST CT CERVICAL SPINE WITHOUT CONTRAST TECHNIQUE: Multidetector CT imaging of the head and cervical spine was performed following the standard protocol without intravenous contrast. Multiplanar CT image reconstructions of the cervical spine were also generated. COMPARISON:  No priors. FINDINGS: CT HEAD FINDINGS Brain: Patchy and confluent areas of decreased attenuation are noted throughout the deep and  periventricular white matter of the cerebral hemispheres bilaterally, compatible with chronic microvascular ischemic disease. No evidence of acute infarction, hemorrhage, hydrocephalus, extra-axial collection or mass lesion/mass effect. Vascular: No hyperdense vessel or unexpected calcification. Skull: Normal. Negative for fracture or focal lesion. Sinuses/Orbits: No acute finding. Other: None. CT CERVICAL SPINE FINDINGS Alignment: Reversal of normal cervical lordosis, most severe at C3, likely chronic. 3 mm of anterolisthesis of C2 upon C3, also favored to be chronic related to degenerative disease. Skull base and vertebrae: Status post ACDF at C5-C6. Complete bony fusion from C3-C7. No acute fracture. No primary bone lesion or focal pathologic process. Soft tissues and spinal canal: No prevertebral fluid or swelling. No visible canal hematoma. Disc levels: Severe multilevel degenerative disc disease with complete bony fusion from C3-C7. Severe multilevel facet arthropathy. Upper chest: Interstitial prominence in the visualized lung apices, similar to the prior study from 07/26/2018. 2 mm right upper lobe pulmonary nodule unchanged compared to the 07/26/2018, considered definitively benign. Other: None. IMPRESSION: 1. No evidence of significant acute traumatic injury to the skull, brain or cervical spine. 2. Chronic microvascular ischemic changes in the cerebral white matter, as above. 3. Severe multilevel degenerative disc disease and cervical spondylosis, as above, with postoperative changes of ACDF at C5-C6. Electronically Signed   By: Trudie Reed M.D.   On: 03/27/2021 14:48   DG Knee Complete 4 Views Right  Result Date: 03/30/2021 CLINICAL DATA:  Right knee pain. EXAM: RIGHT KNEE - COMPLETE 4+ VIEW COMPARISON:  None. FINDINGS: No fracture or bone lesion. Knee joint is normally spaced and aligned. No significant degenerative/arthropathic changes. Moderate joint effusion. Scattered posterior arterial  atherosclerotic calcifications. Soft tissues otherwise unremarkable. IMPRESSION: 1. No fracture or bone lesion. No significant degenerative/arthropathic changes. 2. Nonspecific moderate suprapatellar joint effusion. Electronically Signed   By: Amie Portland M.D.   On: 03/30/2021 18:30    Assessment/Plan 1. Parasitosis Delusional per previous infectious disease.request [facility] for evaluation. - Ambulatory referral to Infectious Disease  2. Nausea Continue on Zofran as needed for N/V  Has upcoming appointment with GI on 05/05/2021  3. Chronic right-sided low back pain without sciatica Chronic  Continue to follow up with Pain management clinic   Family/ staff Communication: Reviewed plan of care with patient verbalized understanding  Labs/tests ordered: None   Next Appointment: As needed if symptoms worsen or fail to improve    Caesar Bookman, NP

## 2021-04-29 ENCOUNTER — Encounter (HOSPITAL_COMMUNITY): Payer: Self-pay | Admitting: Emergency Medicine

## 2021-04-29 ENCOUNTER — Other Ambulatory Visit: Payer: Self-pay

## 2021-04-29 ENCOUNTER — Emergency Department (HOSPITAL_COMMUNITY): Payer: Medicare Other

## 2021-04-29 ENCOUNTER — Emergency Department (HOSPITAL_COMMUNITY)
Admission: EM | Admit: 2021-04-29 | Discharge: 2021-04-29 | Disposition: A | Discharge status: Home / Self Care | Payer: Medicare Other | Attending: Emergency Medicine | Admitting: Emergency Medicine

## 2021-04-29 DIAGNOSIS — Z79899 Other long term (current) drug therapy: Secondary | ICD-10-CM | POA: Diagnosis not present

## 2021-04-29 DIAGNOSIS — R519 Headache, unspecified: Secondary | ICD-10-CM | POA: Diagnosis not present

## 2021-04-29 DIAGNOSIS — E039 Hypothyroidism, unspecified: Secondary | ICD-10-CM | POA: Insufficient documentation

## 2021-04-29 DIAGNOSIS — R112 Nausea with vomiting, unspecified: Secondary | ICD-10-CM

## 2021-04-29 DIAGNOSIS — R5381 Other malaise: Secondary | ICD-10-CM | POA: Diagnosis not present

## 2021-04-29 DIAGNOSIS — Z96649 Presence of unspecified artificial hip joint: Secondary | ICD-10-CM | POA: Insufficient documentation

## 2021-04-29 DIAGNOSIS — Z20822 Contact with and (suspected) exposure to covid-19: Secondary | ICD-10-CM | POA: Diagnosis not present

## 2021-04-29 DIAGNOSIS — N189 Chronic kidney disease, unspecified: Secondary | ICD-10-CM | POA: Insufficient documentation

## 2021-04-29 DIAGNOSIS — Y900 Blood alcohol level of less than 20 mg/100 ml: Secondary | ICD-10-CM | POA: Diagnosis not present

## 2021-04-29 LAB — COMPREHENSIVE METABOLIC PANEL
ALT: 19 U/L (ref 0–44)
AST: 26 U/L (ref 15–41)
Albumin: 4.3 g/dL (ref 3.5–5.0)
Alkaline Phosphatase: 85 U/L (ref 38–126)
Anion gap: 9 (ref 5–15)
BUN: 16 mg/dL (ref 8–23)
CO2: 27 mmol/L (ref 22–32)
Calcium: 9.9 mg/dL (ref 8.9–10.3)
Chloride: 108 mmol/L (ref 98–111)
Creatinine, Ser: 0.7 mg/dL (ref 0.44–1.00)
GFR, Estimated: >60 mL/min (ref >60)
Glucose, Bld: 98 mg/dL (ref 70–99)
Potassium: 4.3 mmol/L (ref 3.5–5.1)
Sodium: 144 mmol/L (ref 135–145)
Total Bilirubin: 0.7 mg/dL (ref 0.3–1.2)
Total Protein: 7.7 g/dL (ref 6.5–8.1)

## 2021-04-29 LAB — CBC WITH DIFFERENTIAL/PLATELET
Abs Immature Granulocytes: 0.01 10*3/uL (ref 0.00–0.07)
Basophils Absolute: 0 10*3/uL (ref 0.0–0.1)
Basophils Relative: 1 %
Eosinophils Absolute: 0 10*3/uL (ref 0.0–0.5)
Eosinophils Relative: 1 %
HCT: 44.8 % (ref 36.0–46.0)
Hemoglobin: 15 g/dL (ref 12.0–15.0)
Immature Granulocytes: 0 %
Lymphocytes Relative: 19 %
Lymphs Abs: 0.9 10*3/uL (ref 0.7–4.0)
MCH: 29.8 pg (ref 26.0–34.0)
MCHC: 33.5 g/dL (ref 30.0–36.0)
MCV: 89.1 fL (ref 80.0–100.0)
Monocytes Absolute: 0.4 10*3/uL (ref 0.1–1.0)
Monocytes Relative: 7 %
Neutro Abs: 3.5 10*3/uL (ref 1.7–7.7)
Neutrophils Relative %: 72 %
Platelets: 289 10*3/uL (ref 150–400)
RBC: 5.03 MIL/uL (ref 3.87–5.11)
RDW: 13.6 % (ref 11.5–15.5)
WBC: 4.9 10*3/uL (ref 4.0–10.5)
nRBC: 0 % (ref 0.0–0.2)

## 2021-04-29 LAB — URINALYSIS, ROUTINE W REFLEX MICROSCOPIC
Bacteria, UA: NONE SEEN
Bilirubin Urine: NEGATIVE
Glucose, UA: NEGATIVE mg/dL
Ketones, ur: 20 mg/dL — AB
Leukocytes,Ua: NEGATIVE
Nitrite: NEGATIVE
Protein, ur: NEGATIVE mg/dL
Specific Gravity, Urine: 1.014 (ref 1.005–1.030)
pH: 6 (ref 5.0–8.0)

## 2021-04-29 LAB — RESP PANEL BY RT-PCR (FLU A&B, COVID) ARPGX2
Influenza A by PCR: NEGATIVE
Influenza B by PCR: NEGATIVE
SARS Coronavirus 2 by RT PCR: NEGATIVE

## 2021-04-29 LAB — ETHANOL: Alcohol, Ethyl (B): <10 mg/dL (ref <10)

## 2021-04-29 LAB — LIPASE, BLOOD: Lipase: 26 U/L (ref 11–51)

## 2021-04-29 MED ORDER — PROMETHAZINE HCL 25 MG RE SUPP
25.0000 mg | Freq: Four times a day (QID) | RECTAL | Status: DC | PRN
  Administered 2021-04-29: 25 mg via RECTAL
  Filled 2021-04-29 (×4): qty 1

## 2021-04-29 MED ORDER — SODIUM CHLORIDE 0.9 % IV BOLUS
1000.0000 mL | Freq: Once | INTRAVENOUS | Status: AC
  Administered 2021-04-29: 1000 mL via INTRAVENOUS

## 2021-04-29 MED ORDER — PROMETHAZINE HCL 25 MG RE SUPP: 25.0000 mg | Freq: Four times a day (QID) | RECTAL | 0 refills | Status: DC | PRN

## 2021-04-29 MED ORDER — ONDANSETRON HCL 4 MG/2ML IJ SOLN
4.0000 mg | Freq: Once | INTRAMUSCULAR | Status: AC
  Administered 2021-04-29: 4 mg via INTRAVENOUS
  Filled 2021-04-29: qty 2

## 2021-04-29 NOTE — Discharge Instructions (Signed)
The testing today does not show any serious problems.  We are giving a prescription for Phenergan suppositories to use if needed.  Follow-up with your primary care doctor and orthopedic doctor as needed for problems and concerns.

## 2021-04-29 NOTE — ED Triage Notes (Signed)
The patient presents from home with complaints of nausea and vomiting since Sunday, She also complains of a headache. Due to nausea and vomiting, the patient has been unable to take her medications. The patient has also had multiple falls. EMS administered 4 mg of Zofran IV.    EMS vitals: 164/98 BP 74 HR 18 RR 95% SPO2 room air

## 2021-04-29 NOTE — ED Provider Notes (Signed)
[facility]-EMERGENCY DEPT Provider Note   CSN: [id number] Arrival date & time: 04/29/21  1143     History Chief Complaint  Patient presents with   Nausea   Emesis   Headache    [name] is a 70 y.o. female.  HPI Patient states she is here for evaluation of multiple falls which occurred over the last 6 weeks.  She states her last fall was 3 weeks ago.  She reports having "knots in my forehead," after the falls she denies loss of consciousness.  She attributes her difficulty walking to the falling.  She intermittently uses cane, walker and wheelchair for ambulation assistance.  She has had chronic and recurrent pelvic problems which have required surgery and hip replacement.  She recently saw her PCP for evaluation of ongoing nausea and vomiting.  PCP has been treating her with Zofran.  PCP has referred her to a ID doctor for evaluation of parasitosis.  She has a prior diagnosis of delusional parasitosis.  Currently she states that she feels dehydrated and has a severe headache.  She denies fever, chills, cough or chest pain.  There are no other known active modifying factors.    Past Medical History:  Diagnosis Date   ADHD    Anemia    Cellulitis 07/2018   left lower extremity   Chronic kidney disease    surgery to fix   Chronic pain    Depression    major depression   DVT (deep venous thrombosis) (HCC)    Ekbom's delusional parasitosis (HCC)    Fibromyalgia    History of Zenker's diverticulum removal    Lyme disease    Migraine with aura    Multiple falls    Thyroid disease    hypothyroid    Patient Active Problem List   Diagnosis Date Noted   Fall 12/17/2020   Lower leg DVT (deep venous thromboembolism), acute, left (HCC) 07/28/2018   Cellulitis of left lower extremity    Leg edema, left 07/26/2018   Anxiety 08/12/2017   Chronic low back pain without sciatica 08/12/2017   Status post revision of total hip 08/12/2017   Multiple allergies  01/26/2017   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   Stricture and stenosis of esophagus 11/30/2013   Throat pain 11/30/2013   Diverticulosis of colon without hemorrhage 10/29/2013   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  bilateral hips   Back fusion  1999   CERVICAL FUSION  4098,1191   EYE SURGERY     eye muscle   KIDNEY SURGERY  1971   kidney reconstruction   LAMINECTOMY  1985   TOTAL HIP ARTHROPLASTY       OB History   No obstetric history on file.     Family History  Problem Relation Age of Onset   Cancer Sister    Hemochromatosis Sister    Cancer Mother        cancr of unknown origin  Colon cancer Neg Hx    Stomach cancer Neg Hx    Esophageal cancer Neg Hx     Social History   Tobacco Use   Smoking status: Never   Smokeless tobacco: Never  Vaping Use   Vaping Use: Never used  Substance Use Topics   Alcohol use: Not Currently    Comment: rarely   Drug use: No    Home Medications Prior to Admission medications   Medication Sig Start Date End Date Taking? Authorizing Provider  promethazine (PHENERGAN) 25 MG suppository Place 1 suppository (25 mg total) rectally every 6 (six) hours as needed for nausea or vomiting. 04/29/21  Yes Mancel Bale, MD  betamethasone valerate ointment (VALISONE) 0.1 % Apply 1 application topically 2 (two) times daily. Use for 2 weeks for a flare.  Apply  twice a week at bedtime for maintenance dosing. 01/16/21   Ardell Isaacs, Forrestine Him, MD  buPROPion HCl ER, XL, 450 MG TB24 TAKE ONE TABLET EACH DAY 04/02/21   Ngetich, Dinah C, NP  cyclobenzaprine (FLEXERIL) 5 MG tablet Take 1 tablet (5 mg total) by mouth daily as needed for muscle spasms. Take 1 tablet daily and as needed for muscle spasms 11/11/20   Ngetich, Dinah C, NP  cyclobenzaprine (FLEXERIL) 5 MG tablet Take 5 mg by mouth in the morning, at noon, and at bedtime.    [provider]  Diclofenac Sodium CR 100 MG 24 hr tablet Take 100 mg by mouth 2 (two) times daily.    [provider]  methocarbamol (ROBAXIN) 500 MG tablet Take 1 tablet (500 mg total) by mouth as needed for muscle spasms. 1-2 tablets PRN 11/10/20   Ngetich, Dinah C, NP  metoCLOPramide (REGLAN) 5 MG tablet Take 1 tablet (5 mg total) by mouth in the morning and at bedtime. 04/16/21   Octavia Heir, NP  NP THYROID 60 MG tablet TAKE ONE TABLET EACH DAY BEFORE BREAKFAST 03/25/21   Ngetich, Dinah C, NP  ondansetron (ZOFRAN) 4 MG tablet Take 1 tablet (4 mg total) by mouth every 8 (eight) hours as needed for nausea or vomiting. 04/15/21   Ngetich, Dinah C, NP  RITALIN 5 MG tablet Take 1 tablet (5 mg total) by mouth daily as needed. 11/24/20   Ngetich, Dinah C, NP  RITALIN LA 20 MG 24 hr capsule TAKE ONE CAPSULE EACH DAY 03/19/21   Sharon Seller, NP  sertraline (ZOLOFT) 25 MG tablet Take 1 tablet (25 mg total) by mouth daily. 04/01/21   Ngetich, Dinah C, NP  topiramate (TOPAMAX) 100 MG tablet Take 100 mg by mouth as needed.    [provider]    Allergies    Amoxicillin-pot clavulanate, Clarithromycin, Fentanyl, Levofloxacin, Lorazepam, Doxycycline, Penicillins, and Sulfa antibiotics  Review of Systems   Review of Systems  All other systems reviewed and are negative.  Physical Exam Updated Vital Signs BP 135/85   Pulse 70   Temp 98.7 F (37.1 C) (Oral)   Resp 16   LMP 07/19/2000 (Approximate)   SpO2 97%    Physical Exam Vitals and nursing note reviewed.  Constitutional:      General: She is not in acute distress.    Appearance: She is well-developed. She is not ill-appearing, toxic-appearing or diaphoretic.  HENT:     Head: Normocephalic and atraumatic.     Right Ear: External ear normal.     Left Ear: External ear normal.     Nose: No congestion or rhinorrhea.  Eyes:  Conjunctiva/sclera: Conjunctivae normal.     Pupils: Pupils are equal, round, and reactive to light.  Neck:     Trachea: Phonation normal.  Cardiovascular:     Rate and Rhythm: Normal rate and regular rhythm.     Heart sounds: Normal heart sounds.  Pulmonary:     Effort: Pulmonary effort is normal.     Breath sounds: Normal breath sounds.  Abdominal:     General: There is no distension.     Palpations: Abdomen is soft.     Tenderness: There is no abdominal tenderness.  Musculoskeletal:        General: No swelling or tenderness.     Cervical back: Normal range of motion and neck supple.  Skin:    General: Skin is warm and dry.  Neurological:     Mental Status: She is alert and oriented to person, place, and time.     Cranial Nerves: No cranial nerve deficit.     Sensory: No sensory deficit.     Motor: No abnormal muscle tone.     Coordination: Coordination normal.  Psychiatric:        Mood and Affect: Mood normal.        Behavior: Behavior normal.        Thought Content: Thought content normal.        Judgment: Judgment normal.    ED Results / Procedures / Treatments   Labs (all labs ordered are listed, but only abnormal results are displayed) Labs Reviewed  RESP PANEL BY RT-PCR (FLU A&B, COVID) [id number]  COMPREHENSIVE METABOLIC PANEL  CBC WITH DIFFERENTIAL/PLATELET  LIPASE, BLOOD  ETHANOL  URINALYSIS, ROUTINE W REFLEX MICROSCOPIC    EKG None  Radiology DG Pelvis 1-2 Views  Result Date: 04/29/2021 CLINICAL DATA:  Multiple falls, pain EXAM: PELVIS - 1-2 VIEW COMPARISON:  05/14/2020,  12/03/2020 FINDINGS: 2 frontal views of the pelvis demonstrate bilateral hip arthroplasties. 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 The right hip arthroplasty is well aligned. There are no acute displaced fractures. Postsurgical changes are seen at L4-5 and L5-S1. IMPRESSION: 1. 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 2. No acute fracture. Electronically Signed   By: Sharlet Salina M.D.   On: 04/29/2021 15:35   CT Head Wo Contrast  Result Date: 04/29/2021 CLINICAL DATA:  Poly trauma. EXAM: CT HEAD WITHOUT CONTRAST CT CERVICAL SPINE WITHOUT CONTRAST TECHNIQUE: Multidetector CT imaging of the head and cervical spine was performed following the standard protocol without intravenous contrast. Multiplanar CT image reconstructions of the cervical spine were also generated. COMPARISON:  CT head and cervical spine 03/27/2021 FINDINGS: CT HEAD FINDINGS Brain: No evidence of acute infarction, hemorrhage, hydrocephalus, extra-axial collection or mass lesion/mass effect. Mild white matter hypodensity bilaterally, chronic and unchanged. Vascular: Negative for hyperdense vessel Skull: Negative Sinuses/Orbits: Negative Other: None CT CERVICAL SPINE FINDINGS Alignment: 3 mm anterolisthesis C2-3 unchanged. Mild retrolisthesis C3-4 and C4-5 unchanged. Skull base and vertebrae: Negative for fracture Soft tissues and spinal canal: No soft tissue mass or edema. Disc levels: Advanced degenerative changes C1-2 and C2-3. There is mild spinal stenosis at C2 related at anterolisthesis of posterior arch of C1 relative to C2. This is unchanged. ACDF with hardware at C5-6. Solid interbody fusion C3 through C7. Advanced degenerative change in the upper thoracic spine. Upper chest: Lung apices clear bilaterally. Other: None IMPRESSION: No acute intracranial abnormality Advanced cervical spondylosis.  Negative for fracture.  Electronically  Signed   By: Marlan Palau M.D.   On: 04/29/2021 14:51   CT Cervical Spine Wo Contrast  Result Date: 04/29/2021 CLINICAL DATA:  Poly trauma. EXAM: CT HEAD WITHOUT CONTRAST CT CERVICAL SPINE WITHOUT CONTRAST TECHNIQUE: Multidetector CT imaging of the head and cervical spine was performed following the standard protocol without intravenous contrast. Multiplanar CT image reconstructions of the cervical spine were also generated. COMPARISON:  CT head and cervical spine 03/27/2021 FINDINGS: CT HEAD FINDINGS Brain: No evidence of acute infarction, hemorrhage, hydrocephalus, extra-axial collection or mass lesion/mass effect. Mild white matter hypodensity bilaterally, chronic and unchanged. Vascular: Negative for hyperdense vessel Skull: Negative Sinuses/Orbits: Negative Other: None CT CERVICAL SPINE FINDINGS Alignment: 3 mm anterolisthesis C2-3 unchanged. Mild retrolisthesis C3-4 and C4-5 unchanged. Skull base and vertebrae: Negative for fracture Soft tissues and spinal canal: No soft tissue mass or edema. Disc levels: Advanced degenerative changes C1-2 and C2-3. There is mild spinal stenosis at C2 related at anterolisthesis of posterior arch of C1 relative to C2. This is unchanged. ACDF with hardware at C5-6. Solid interbody fusion C3 through C7. Advanced degenerative change in the upper thoracic spine. Upper chest: Lung apices clear bilaterally. Other: None IMPRESSION: No acute intracranial abnormality Advanced cervical spondylosis.  Negative for fracture. Electronically Signed   By: Marlan Palau M.D.   On: 04/29/2021 14:51    Procedures Procedures   Medications Ordered in ED Medications  promethazine (PHENERGAN) suppository 25 mg (has no administration in time range)  sodium chloride 0.9 % bolus 1,000 mL (1,000 mLs Intravenous New Bag/Given 04/29/21 1352)  ondansetron (ZOFRAN) injection 4 mg (4 mg Intravenous Given 04/29/21 1351)    ED Course  I have reviewed the triage vital signs  and the nursing notes.  Pertinent labs & imaging results that were available during my care of the patient were reviewed by me and considered in my medical decision making (see chart for details).  Clinical Course as of 04/29/21 1640  Wed Apr 29, 2021  1632 At this time the patient shows me a piece of paper reporting to say that she cannot make decisions until her chronic hip dislocation was repaired.  She also shows me another piece of paper that indicates that she has a parasitic disease.  She now demands to be treated for a "migraine headache."  She is currently awaiting a Phenergan suppository to be given.  I instructed the nurse to discharge the patient as soon as her suppository is given. [EW]    Clinical Course User Index [EW] Mancel Bale, MD   MDM Rules/Calculators/A&P                            Patient Vitals for the past 24 hrs:  BP Temp Temp src Pulse Resp SpO2  04/29/21 1430 -- -- -- 70 -- 97 %  04/29/21 1400 135/85 -- -- 71 16 97 %  04/29/21 1330 140/90 -- -- 75 -- 95 %  04/29/21 1315 129/88 -- -- 72 16 95 %  04/29/21 1300 136/80 -- -- 69 -- 96 %  04/29/21 1245 (!) 143/88 -- -- 74 -- 96 %  04/29/21 1230 137/79 -- -- 66 -- 95 %  04/29/21 1215 140/88 -- -- 68 -- 94 %  04/29/21 1200 (!) 142/83 -- -- 70 -- 96 %  04/29/21 1150 (!) 148/92 98.7 F (37.1 C) Oral 70 16 100 %    3:45 PM Reevaluation with update and discussion. After initial  assessment and treatment, an updated evaluation reveals no change in clinical status.  She requested a suppository, prior to discharge.  She then stated that she wanted me to look at some records from her orthopedist in [place].  Mancel Bale   Medical Decision Making:  This patient is presenting for evaluation of malaise and frequent falls, which does require a range of treatment options, and is a complaint that involves a moderate risk of morbidity and mortality. The differential diagnoses include intracranial injury, acute metabolic or  infectious disorder. I decided to review old records, and in summary elderly female with history of delusional disease, presenting with multiple complaints ongoing nature over several weeks time.  She is a reluctant historian initially did not tell me that she had had a head scan, 3 weeks ago, then did not want the type of scan I ordered..  I did not require additional historical information from anyone.  Clinical Laboratory Tests Ordered, included CBC, Metabolic panel, and lipase, alcohol level, viral panel . Review indicates normal. Radiologic Tests Ordered, included CT head and CT cervical spine, x-ray pelvis.  I independently Visualized: Radiograph images, which show no acute abnormalities.  Persistent left hip chronic dislocation    Critical Interventions-clinical evaluation, laboratory testing, IV fluids, medications, observation reassessment  After These Interventions, the Patient was reevaluated and was found with nonspecific complaints, recently evaluated by her PCP, twice within the last 2 weeks.  ED evaluation is reassuring today.  No active vomiting in the ED.  No evidence for acute unstable metabolic disorder.  Hemodynamically stable.  There is no indication for hospitalization at this time.  4:40 PM-patient is demanding to stay and have her "migraine headache treated.  She was initially reporting headache from falls, 3 times over the last 6 weeks, and did not complain of headache.  She is stating that she cannot make a legal decision until her chronic hip disorder, side is repaired.  She has a piece of paper purported to show a parasitic disease.  Patient does not have signs of acute intestinal parasitic disease.  She does not have signs of not having capacity to decide.  She does not require further treatment in the ED or hospitalization.  CRITICAL CARE-no Performed by: Mancel Bale  Nursing Notes Reviewed/ Care Coordinated Applicable Imaging Reviewed Interpretation of Laboratory  Data incorporated into ED treatment  The patient appears reasonably screened and/or stabilized for discharge and I doubt any other medical condition or other [facility] requiring further screening, evaluation, or treatment in the ED at this time prior to discharge.  Plan: Home Medications-continue current; Home Treatments-rest, full; return here if the recommended treatment, does not improve the symptoms; Recommended follow up-PCP, PR     Final Clinical Impression(s) / ED Diagnoses Final diagnoses:  Nausea and vomiting, unspecified vomiting type  Malaise    Rx / DC Orders ED Discharge Orders          Ordered    promethazine (PHENERGAN) 25 MG suppository  Every 6 hours PRN        04/29/21 1552             Mancel Bale, MD 04/29/21 1642

## 2021-05-04 ENCOUNTER — Other Ambulatory Visit: Payer: Self-pay | Admitting: Nurse Practitioner

## 2021-05-04 NOTE — Telephone Encounter (Signed)
Pharmacy requested refill Epic LR: 03/19/2021 Contract on File.  Pended Rx and sent to [facility] for approval.

## 2021-05-05 ENCOUNTER — Ambulatory Visit (INDEPENDENT_AMBULATORY_CARE_PROVIDER_SITE_OTHER): Payer: Medicare Other | Admitting: Gastroenterology

## 2021-05-05 ENCOUNTER — Encounter: Payer: Self-pay | Admitting: Gastroenterology

## 2021-05-05 VITALS — BP 126/72 | HR 90 | Ht 60.0 in | Wt 128.0 lb

## 2021-05-05 DIAGNOSIS — Z1211 Encounter for screening for malignant neoplasm of colon: Secondary | ICD-10-CM | POA: Diagnosis not present

## 2021-05-05 DIAGNOSIS — R1310 Dysphagia, unspecified: Secondary | ICD-10-CM | POA: Diagnosis not present

## 2021-05-05 DIAGNOSIS — R112 Nausea with vomiting, unspecified: Secondary | ICD-10-CM

## 2021-05-05 DIAGNOSIS — F22 Delusional disorders: Secondary | ICD-10-CM | POA: Diagnosis not present

## 2021-05-05 NOTE — Progress Notes (Signed)
HPI : [name] is a pleasant 70 year old female with Ekbom's delusional parasitosis, history of failed L hip arthroplasty and depression who is referred to [place] by [facility] for further evaluation of persistent vomiting.  The patient initially tells me that she has been vomiting since 2012, which is when she was diagnosed with parasites which she contracted from her ex-husband.  She then goes off on a tangent regarding her hip surgeries.  During the encounter, the patient frequently bounced back and forth between topics of her perceived parasitosis and her hip surgical history.  It was difficult to keep the discussion on her symptoms of vomiting and to get a coherent history. Patient states that she has been unable to eat for the past month and has been vomiting daily.  Sometimes she vomits food that she tried to eat, other times its vomiting up bilious liquids.  Review of the electronic medical record indicates that the patient has not lost any weight.  She states that she sees parasitic worms in her vomit.  She states that her stools are very hard and difficult to pass, but she also sees parasitic worms swimming in the toilet water after bowel movements.  No blood in the stool. She also reports sensation of food getting stuck in her chest when swallowing.  She states she has to chew her food up very thoroughly, but even foods like pudding feel like they stick in her throat.  The patient also makes frequent references to black discoloration and "markings" on her skin over the left shoulder, chest and arm which she says were caused by parasites. She presented to the emergency room last week with symptoms of nausea and vomiting and was found to have normal CBC, CMP, lipase and vitals and she was discharged home.  Patient has a history of benign esophageal stricture and cricopharyngeal bar as well as a history of Zenker's diverticulum.  She underwent an EGD with dilation with improvement in her  dysphagia in 2015. A barium swallow around that time showed nonspecific peristaltic abnormalities of the esophagus, as well as a prominent cricopharyngeal impression and a 1.9 cm Schatzki ring. She underwent a colonoscopy in 2013 which was notable for diverticulosis, otherwise unremarkable.   Past Medical History:  Diagnosis Date   ADHD    Anemia    Cellulitis 07/2018   left lower extremity   Chronic kidney disease    surgery to fix   Chronic pain    Depression    major depression   DVT (deep venous thrombosis) (HCC)    Ekbom's delusional parasitosis (HCC)    Fibromyalgia    History of Zenker's diverticulum removal    Lyme disease    Migraine with aura    Multiple falls    Thyroid disease    hypothyroid     Past Surgical History:  Procedure Laterality Date   APPENDECTOMY  1974   Avascular necrosis of the hip     multiple operations starting in 2005.  bilateral hips   Back fusion  1999   CERVICAL FUSION  8938,1017   EYE SURGERY     eye muscle   KIDNEY SURGERY  1971   kidney reconstruction   LAMINECTOMY  1985   TOTAL HIP ARTHROPLASTY     Family History  Problem Relation Age of Onset   Cancer Sister    Hemochromatosis Sister    Cancer Mother        cancr of unknown origin   Colon  cancer Neg Hx    Stomach cancer Neg Hx    Esophageal cancer Neg Hx    Social History   Tobacco Use   Smoking status: Never   Smokeless tobacco: Never  Vaping Use   Vaping Use: Never used  Substance Use Topics   Alcohol use: Not Currently    Comment: rarely   Drug use: No   Current Outpatient Medications  Medication Sig Dispense Refill   betamethasone valerate ointment (VALISONE) 0.1 % Apply 1 application topically 2 (two) times daily. Use for 2 weeks for a flare.  Apply twice a week at bedtime for maintenance dosing. 45 g 1   buPROPion HCl ER, XL, 450 MG TB24 TAKE ONE TABLET EACH DAY 30 tablet 3   cyclobenzaprine (FLEXERIL) 5 MG tablet Take 1 tablet (5 mg total) by mouth daily  as needed for muscle spasms. Take 1 tablet daily and as needed for muscle spasms 30 tablet 5   cyclobenzaprine (FLEXERIL) 5 MG tablet Take 5 mg by mouth in the morning, at noon, and at bedtime.     Diclofenac Sodium CR 100 MG 24 hr tablet Take 100 mg by mouth 2 (two) times daily.     methocarbamol (ROBAXIN) 500 MG tablet Take 1 tablet (500 mg total) by mouth as needed for muscle spasms. 1-2 tablets PRN 30 tablet 3   metoCLOPramide (REGLAN) 5 MG tablet Take 1 tablet (5 mg total) by mouth in the morning and at bedtime. 60 tablet 1   NP THYROID 60 MG tablet TAKE ONE TABLET EACH DAY BEFORE BREAKFAST 30 tablet 3   ondansetron (ZOFRAN) 4 MG tablet Take 1 tablet (4 mg total) by mouth every 8 (eight) hours as needed for nausea or vomiting. 20 tablet 1   promethazine (PHENERGAN) 25 MG suppository Place 1 suppository (25 mg total) rectally every 6 (six) hours as needed for nausea or vomiting. 12 each 0   RITALIN 5 MG tablet Take 1 tablet (5 mg total) by mouth daily as needed. 30 tablet 0   RITALIN LA 20 MG 24 hr capsule TAKE ONE CAPSULE EACH DAY 30 capsule 0   topiramate (TOPAMAX) 100 MG tablet Take 100 mg by mouth as needed.     No current facility-administered medications for this visit.   Allergies  Allergen Reactions   Amoxicillin-Pot Clavulanate Nausea And Vomiting and Other (See Comments)   Clarithromycin Other (See Comments)    DOES NOT REMEMBER REACTION DOES NOT REMEMBER REACTION    Fentanyl Nausea And Vomiting and Other (See Comments)    FENTANYL PATCH FENTANYL PATCH FENTANYL PATCH    Levofloxacin Swelling    Face,eye swelling Face,eye swelling    Lorazepam Other (See Comments)    DO NOT PUT ANY ATIVAN ON PATIENT'S IV POST SURGERY! DO NOT PUT ANY ATIVAN ON PATIENT'S IV POST SURGERY!    Doxycycline Nausea And Vomiting   Penicillins Hives   Sulfa Antibiotics Other (See Comments)    Per pt: unknown     Review of Systems: All systems reviewed and negative except where noted in  HPI.    DG Pelvis 1-2 Views  Result Date: 04/29/2021 CLINICAL DATA:  Multiple falls, pain EXAM: PELVIS - 1-2 VIEW COMPARISON:  05/14/2020, 12/03/2020 FINDINGS: 2 frontal views of the pelvis demonstrate bilateral hip arthroplasties. 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 The right hip arthroplasty is well aligned. There are no acute displaced fractures. Postsurgical changes are seen at L4-5 and L5-S1.  IMPRESSION: 1. 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 2. No acute fracture. Electronically Signed   By: Sharlet Salina M.D.   On: 04/29/2021 15:35   CT Head Wo Contrast  Result Date: 04/29/2021 CLINICAL DATA:  Poly trauma. EXAM: CT HEAD WITHOUT CONTRAST CT CERVICAL SPINE WITHOUT CONTRAST TECHNIQUE: Multidetector CT imaging of the head and cervical spine was performed following the standard protocol without intravenous contrast. Multiplanar CT image reconstructions of the cervical spine were also generated. COMPARISON:  CT head and cervical spine 03/27/2021 FINDINGS: CT HEAD FINDINGS Brain: No evidence of acute infarction, hemorrhage, hydrocephalus, extra-axial collection or mass lesion/mass effect. Mild white matter hypodensity bilaterally, chronic and unchanged. Vascular: Negative for hyperdense vessel Skull: Negative Sinuses/Orbits: Negative Other: None CT CERVICAL SPINE FINDINGS Alignment: 3 mm anterolisthesis C2-3 unchanged. Mild retrolisthesis C3-4 and C4-5 unchanged. Skull base and vertebrae: Negative for fracture Soft tissues and spinal canal: No soft tissue mass or edema. Disc levels: Advanced degenerative changes C1-2 and C2-3. There is mild spinal stenosis at C2 related at anterolisthesis of posterior arch of C1 relative to C2. This is unchanged. ACDF with hardware at C5-6. Solid interbody fusion C3 through C7. Advanced degenerative change in the upper thoracic spine. Upper chest: Lung  apices clear bilaterally. Other: None IMPRESSION: No acute intracranial abnormality Advanced cervical spondylosis.  Negative for fracture. Electronically Signed   By: Marlan Palau M.D.   On: 04/29/2021 14:51   CT Cervical Spine Wo Contrast  Result Date: 04/29/2021 CLINICAL DATA:  Poly trauma. EXAM: CT HEAD WITHOUT CONTRAST CT CERVICAL SPINE WITHOUT CONTRAST TECHNIQUE: Multidetector CT imaging of the head and cervical spine was performed following the standard protocol without intravenous contrast. Multiplanar CT image reconstructions of the cervical spine were also generated. COMPARISON:  CT head and cervical spine 03/27/2021 FINDINGS: CT HEAD FINDINGS Brain: No evidence of acute infarction, hemorrhage, hydrocephalus, extra-axial collection or mass lesion/mass effect. Mild white matter hypodensity bilaterally, chronic and unchanged. Vascular: Negative for hyperdense vessel Skull: Negative Sinuses/Orbits: Negative Other: None CT CERVICAL SPINE FINDINGS Alignment: 3 mm anterolisthesis C2-3 unchanged. Mild retrolisthesis C3-4 and C4-5 unchanged. Skull base and vertebrae: Negative for fracture Soft tissues and spinal canal: No soft tissue mass or edema. Disc levels: Advanced degenerative changes C1-2 and C2-3. There is mild spinal stenosis at C2 related at anterolisthesis of posterior arch of C1 relative to C2. This is unchanged. ACDF with hardware at C5-6. Solid interbody fusion C3 through C7. Advanced degenerative change in the upper thoracic spine. Upper chest: Lung apices clear bilaterally. Other: None IMPRESSION: No acute intracranial abnormality Advanced cervical spondylosis.  Negative for fracture. Electronically Signed   By: Marlan Palau M.D.   On: 04/29/2021 14:51    Physical Exam: BP 126/72   Pulse 90   Ht 5' (1.524 m)   Wt 128 lb (58.1 kg)   LMP 07/19/2000 (Approximate)   SpO2 92%   BMI 25.00 kg/m  Constitutional: Pleasant,well-developed, Caucasian female in no acute distress.  The patient  does not appear ill or weak.  Accompanied by son HEENT: Normocephalic and atraumatic. Conjunctivae are normal. No scleral icterus. Neck supple.  Cardiovascular: Normal rate, regular rhythm.  Systolic murmur at right upper sternal border Pulmonary/chest: Effort normal and breath sounds normal. No wheezing, rales or rhonchi. Abdominal: Soft, nondistended, diffuse, nonreproducible tenderness to light palpation throughout the abdomen bowel sounds active throughout. There are no masses palpable. No hepatomegaly. Extremities: no edema Neurological: Alert and oriented to person place and  time. Skin: Skin is warm and dry. No rashes noted.  Patient showed me the area on her left shoulder and chest where she saw the black discoloration and markings from the parasites; this appeared completely normal Psychiatric: Normal mood and affect.  Tangential thoughts and conversations centered around parasites and her hip surgery  CBC    Component Value Date/Time   WBC 4.9 04/29/2021 1400   RBC 5.03 04/29/2021 1400   HGB 15.0 04/29/2021 1400   HCT 44.8 04/29/2021 1400   PLT 289 04/29/2021 1400   MCV 89.1 04/29/2021 1400   MCV 84.9 10/01/2011 1243   MCH 29.8 04/29/2021 1400   MCHC 33.5 04/29/2021 1400   RDW 13.6 04/29/2021 1400   LYMPHSABS 0.9 04/29/2021 1400   MONOABS 0.4 04/29/2021 1400   EOSABS 0.0 04/29/2021 1400   BASOSABS 0.0 04/29/2021 1400    CMP     Component Value Date/Time   NA 144 04/29/2021 1400   K 4.3 04/29/2021 1400   CL 108 04/29/2021 1400   CO2 27 04/29/2021 1400   GLUCOSE 98 04/29/2021 1400   BUN 16 04/29/2021 1400   CREATININE 0.70 04/29/2021 1400   CREATININE 0.77 04/16/2021 1415   CALCIUM 9.9 04/29/2021 1400   PROT 7.7 04/29/2021 1400   ALBUMIN 4.3 04/29/2021 1400   AST 26 04/29/2021 1400   ALT 19 04/29/2021 1400   ALKPHOS 85 04/29/2021 1400   BILITOT 0.7 04/29/2021 1400   GFRNONAA >60 04/29/2021 1400   GFRNONAA 68 12/24/2020 1019   GFRAA 79 12/24/2020 1019      ASSESSMENT AND PLAN: 70 year old female with longstanding Ekbom's delusional parasitosis and a history of multiple chronic intermittent GI symptoms centered around her fixation on parasitic infection, with reports of constant nausea and vomiting and p.o. intolerance, but with normal labs, vitals and very reassuring physical exam.  Her weight has been stable despite reporting not being able to eat for the past month.  I suspect the patient may be exaggerating the severity of her symptoms, but she does have a history of peptic stricture, esophageal dysmotility and gastric erosions on previous imaging and endoscopy.  I would like to repeat her barium swallow to see if the peristaltic abnormalities persist, reassess for stricture and also to provide rapid reassurance to the patient (patient was requesting an EGD tomorrow).  She may potentially benefit from a repeat dilation as well.  It was a little difficult to figure out how much of her symptoms were related to dysphagia versus her nausea and vomiting.  The patient will also be due for a repeat screening colonoscopy within the next few months.  I recommended we go ahead and complete this study a little bit early while she is getting an upper endoscopy.  Nausea/vomiting - EGD  Dysphagia, history of peptic stricture - EGD with dilation  Colon cancer screening - Screening colonoscopy  The details, risks (including bleeding, perforation, infection, missed lesions, medication reactions and possible hospitalization or surgery if complications occur), benefits, and alternatives to EGD/colonoscopy with possible biopsy and possible polypectomy were discussed with the patient and she consents to proceed.   Nahuel Wilbert E. Tomasa Rand, MD [facility]   CC: Ngetich, Donalee Citrin, NP

## 2021-05-05 NOTE — Patient Instructions (Signed)
If you are age 72 or older, your body mass index should be between 23-30. Your Body mass index is 25 kg/m. If this is out of the aforementioned range listed, please consider follow up with your Primary Care Provider.  If you are age 59 or younger, your body mass index should be between 19-25. Your Body mass index is 25 kg/m. If this is out of the aformentioned range listed, please consider follow up with your Primary Care Provider.   Per your request we did not go over instructions today we will schedule a Pre-visit for when you are ready to be instructed.  You have been scheduled for a Barium Esophogram at [facility] (1st floor of the hospital) on 05/13/21 at 11am. Please arrive 15 minutes prior to your appointment for registration. Make certain not to have anything to eat or drink 3 hours prior to your test. If you need to reschedule for any reason, please contact radiology at [phone] to do so. __________________________________________________________________ A barium swallow is an examination that concentrates on views of the esophagus. This tends to be a double contrast exam (barium and two liquids which, when combined, create a gas to distend the wall of the oesophagus) or single contrast (non-ionic iodine based). The study is usually tailored to your symptoms so a good history is essential. Attention is paid during the study to the form, structure and configuration of the esophagus, looking for functional disorders (such as aspiration, dysphagia, achalasia, motility and reflux) EXAMINATION You may be asked to change into a gown, depending on the type of swallow being performed. A radiologist and radiographer will perform the procedure. The radiologist will advise you of the type of contrast selected for your procedure and direct you during the exam. You will be asked to stand, sit or lie in several different positions and to hold a small amount of fluid in your mouth before being  asked to swallow while the imaging is performed .In some instances you may be asked to swallow barium coated marshmallows to assess the motility of a solid food bolus. The exam can be recorded as a digital or video fluoroscopy procedure. POST PROCEDURE It will take 1-2 days for the barium to pass through your system. To facilitate this, it is important, unless otherwise directed, to increase your fluids for the next 24-48hrs and to resume your normal diet.  This test typically takes about 30 minutes to perform.   The [place] [facility] providers would like to encourage you to use [facility] to communicate with providers for non-urgent requests or questions.  Due to long hold times on the telephone, sending your provider a message by [facility] may be a faster and more efficient way to get a response.  Please allow 48 business hours for a response.  Please remember that this is for non-urgent requests.   It was a pleasure to see you today!  Thank you for trusting me with your gastrointestinal care!    Scott E. Tomasa Rand, MD

## 2021-05-11 ENCOUNTER — Other Ambulatory Visit: Payer: Self-pay | Admitting: Family

## 2021-05-11 NOTE — Telephone Encounter (Signed)
Pharmacy requested refill.  ?Pended Rx and sent to [facility] for approval.  ?

## 2021-05-12 ENCOUNTER — Ambulatory Visit: Payer: Medicare Other | Admitting: Family

## 2021-05-13 ENCOUNTER — Ambulatory Visit (HOSPITAL_COMMUNITY): Payer: Medicare Other

## 2021-05-13 ENCOUNTER — Other Ambulatory Visit: Payer: Self-pay | Admitting: Internal Medicine

## 2021-05-13 DIAGNOSIS — R109 Unspecified abdominal pain: Secondary | ICD-10-CM

## 2021-05-14 ENCOUNTER — Other Ambulatory Visit: Payer: Self-pay | Admitting: Gastroenterology

## 2021-05-14 ENCOUNTER — Other Ambulatory Visit: Payer: Self-pay

## 2021-05-14 ENCOUNTER — Ambulatory Visit (HOSPITAL_COMMUNITY)
Admission: RE | Admit: 2021-05-14 | Discharge: 2021-05-14 | Disposition: A | Discharge status: Home / Self Care | Payer: Medicare Other | Source: Ambulatory Visit | Attending: Gastroenterology | Admitting: Gastroenterology

## 2021-05-14 DIAGNOSIS — R112 Nausea with vomiting, unspecified: Secondary | ICD-10-CM | POA: Insufficient documentation

## 2021-05-14 DIAGNOSIS — R1310 Dysphagia, unspecified: Secondary | ICD-10-CM | POA: Diagnosis present

## 2021-05-14 DIAGNOSIS — Z1211 Encounter for screening for malignant neoplasm of colon: Secondary | ICD-10-CM | POA: Diagnosis present

## 2021-05-15 NOTE — Progress Notes (Signed)
Ms. Romaniello,  Your barium swallow did not reveal any serious abnormalities.  Contrast and the barium tablet were able to go freely from your esophagus into your stomach.  The radiologist noted some minor abnormalities in your esophageal peristalsis (the ordered, sequential contraction of the esophagus during swallowing).  We can potentially evaluate this further with an esophageal manometry test, but it is unlikely to reveal any abnormality that would explain your symptoms (and it is not a comfortable test to do; involves passing a catheter through your nose).  I recommend we hold off on that test for now and reconsider it after your EGD.

## 2021-05-19 ENCOUNTER — Other Ambulatory Visit: Payer: Self-pay | Admitting: Family

## 2021-05-19 DIAGNOSIS — G8929 Other chronic pain: Secondary | ICD-10-CM

## 2021-05-20 ENCOUNTER — Ambulatory Visit: Payer: Medicare Other | Admitting: Family

## 2021-05-20 NOTE — Telephone Encounter (Signed)
Patient has request refill on medication "Cyclobenzaprine 5mg ". Patient last refill was  11/11/2020. Medication has warnings. Medication pend and sent to PCP Ngetich, 11/13/2020, NP . Please Advise.

## 2021-05-21 ENCOUNTER — Ambulatory Visit (INDEPENDENT_AMBULATORY_CARE_PROVIDER_SITE_OTHER): Payer: Medicare Other | Admitting: Family

## 2021-05-21 ENCOUNTER — Other Ambulatory Visit: Payer: Self-pay

## 2021-05-21 ENCOUNTER — Encounter: Payer: Self-pay | Admitting: Family

## 2021-05-21 VITALS — BP 120/84 | HR 98 | Temp 97.5°F | Resp 16 | Ht 60.0 in | Wt 128.0 lb

## 2021-05-21 DIAGNOSIS — F909 Attention-deficit hyperactivity disorder, unspecified type: Secondary | ICD-10-CM

## 2021-05-21 DIAGNOSIS — R1110 Vomiting, unspecified: Secondary | ICD-10-CM

## 2021-05-21 DIAGNOSIS — M21611 Bunion of right foot: Secondary | ICD-10-CM

## 2021-05-21 DIAGNOSIS — M21612 Bunion of left foot: Secondary | ICD-10-CM

## 2021-05-21 DIAGNOSIS — G8929 Other chronic pain: Secondary | ICD-10-CM

## 2021-05-21 DIAGNOSIS — L84 Corns and callosities: Secondary | ICD-10-CM

## 2021-05-21 DIAGNOSIS — K219 Gastro-esophageal reflux disease without esophagitis: Secondary | ICD-10-CM

## 2021-05-21 DIAGNOSIS — M545 Low back pain, unspecified: Secondary | ICD-10-CM | POA: Diagnosis not present

## 2021-05-21 DIAGNOSIS — R2681 Unsteadiness on feet: Secondary | ICD-10-CM

## 2021-05-21 DIAGNOSIS — B89 Unspecified parasitic disease: Secondary | ICD-10-CM

## 2021-05-21 DIAGNOSIS — R296 Repeated falls: Secondary | ICD-10-CM

## 2021-05-21 MED ORDER — PROMETHAZINE HCL 25 MG RE SUPP: 25.0000 mg | Freq: Four times a day (QID) | RECTAL | 0 refills | Status: DC | PRN

## 2021-05-21 MED ORDER — RITALIN 5 MG PO TABS: 5.0000 mg | ORAL_TABLET | Freq: Every day | ORAL | 0 refills | Status: DC | PRN

## 2021-05-21 MED ORDER — METHOCARBAMOL 500 MG PO TABS: 500.0000 mg | ORAL_TABLET | ORAL | 3 refills | Status: DC | PRN

## 2021-05-21 MED ORDER — ONDANSETRON HCL 4 MG PO TABS: 4.0000 mg | ORAL_TABLET | Freq: Three times a day (TID) | ORAL | 1 refills | Status: DC | PRN

## 2021-05-21 NOTE — Progress Notes (Signed)
Provider: Marlowe Sax FNP-C  Allon Costlow, Nelda Bucks, NP  Patient Care Team: Ilay Capshaw, Nelda Bucks, NP as PCP - General (Family Medicine)  Extended Emergency Contact Information Primary Emergency Contact: [name] Mobile Phone: [phone] Relation: Son Secondary Emergency Contact: [name],[name],[name] Home Phone: [phone] Relation: Friend  Code Status:  Full Code  Goals of care: Advanced Directive information Advanced Directives 05/21/2021  Does Patient Have a Medical Advance Directive? No  Does patient want to make changes to medical advance directive? -  Would patient like information on creating a medical advance directive? No - Patient declined     Chief Complaint  Patient presents with   Acute Visit    Patient wants to discuss recent MRI and Foot issues.     HPI:  Pt is a 70 y.o. female seen today for an acute visit for discuss MRI recent and foot issues.states MRI was done at [facility] four days ago on Monday ordered by pain management due to chronic low back pain.made aware will need to discuss results with ordering provider.  States lower back pain has worsen unable to stand for a prolong period of time.Has been using a wheelchair and a walker at home. She was brought in on a wheelchair to visit today by the son.She transfers by her self from wheelchair to chair during visit.On Prior visit was able to walk without any assistive device.   Also states need another referral to [facility] infectious disease.states called ID and was told she need referral.According referral coordinator here at the office ID told patient's referral is being reviewed. In progress no need for another referral. Brought in her Apple computer shown me multiple pictures of her face,eyes and stool states affected by worms.Had short sleeves dress showed me areas on shoulders some sunken and prominent bony areas which she says resulted from worms  underneath her skin.   She complains of left foot great toe pain states noted some worm parasite that have infested on the toe.Also shows blue veins which she associates with infestation.does have callus on lateral great toe that is bothering her.Also has bilateral bunion thinks it was results of parasites.would like referral to foot doctor.  She denies any injuries to left foot though has had multiple falls in the past.    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  bilateral hips   Back fusion  [place]  [id number]   EYE SURGERY     eye muscle   KIDNEY SURGERY  1971   kidney reconstruction   LAMINECTOMY  1985   TOTAL HIP ARTHROPLASTY      Allergies  Allergen Reactions   Amoxicillin-Pot Clavulanate Nausea And Vomiting and Other (See Comments)   Clarithromycin Other (See Comments)  DOES NOT REMEMBER REACTION DOES NOT REMEMBER REACTION    Fentanyl Nausea And Vomiting and Other (See Comments)    FENTANYL PATCH FENTANYL PATCH FENTANYL PATCH    Levofloxacin Swelling    Face,eye swelling Face,eye swelling    Lorazepam Other (See Comments)    DO NOT PUT ANY ATIVAN ON PATIENT'S IV POST SURGERY! DO NOT PUT ANY ATIVAN ON PATIENT'S IV POST SURGERY!    Doxycycline Nausea And Vomiting   Penicillins Hives   Sulfa Antibiotics Other (See Comments)    Per pt: unknown    Outpatient Encounter Medications as of 05/21/2021  Medication Sig    betamethasone valerate ointment (VALISONE) 0.1 % Apply 1 application topically 2 (two) times daily. Use for 2 weeks for a flare.  Apply twice a week at bedtime for maintenance dosing.   buPROPion HCl ER, XL, 450 MG TB24 TAKE ONE TABLET EACH DAY   cyclobenzaprine (FLEXERIL) 5 MG tablet Take 5 mg by mouth in the morning, at noon, and at bedtime.   cyclobenzaprine (FLEXERIL) 5 MG tablet TAKE ONE TABLET EACH DAY AND AS NEEDED FOR MUSCLE SPASM   Diclofenac Sodium CR 100 MG 24 hr tablet TAKE ONE TABLET DAILY WITH FOOD   metoCLOPramide (REGLAN) 5 MG tablet Take 1 tablet (5 mg total) by mouth in the morning and at bedtime.   NP THYROID 60 MG tablet TAKE ONE TABLET EACH DAY BEFORE BREAKFAST   RITALIN LA 20 MG 24 hr capsule TAKE ONE CAPSULE EACH DAY   topiramate (TOPAMAX) 100 MG tablet Take 100 mg by mouth as needed.   [DISCONTINUED] methocarbamol (ROBAXIN) 500 MG tablet Take 1 tablet (500 mg total) by mouth as needed for muscle spasms. 1-2 tablets PRN   [DISCONTINUED] ondansetron (ZOFRAN) 4 MG tablet Take 1 tablet (4 mg total) by mouth every 8 (eight) hours as needed for nausea or vomiting.   [DISCONTINUED] promethazine (PHENERGAN) 25 MG suppository Place 1 suppository (25 mg total) rectally every 6 (six) hours as needed for nausea or vomiting.   [DISCONTINUED] RITALIN 5 MG tablet Take 1 tablet (5 mg total) by mouth daily as needed.   methocarbamol (ROBAXIN) 500 MG tablet Take 1 tablet (500 mg total) by mouth as needed for muscle spasms. 1-2 tablets PRN   ondansetron (ZOFRAN) 4 MG tablet Take 1 tablet (4 mg total) by mouth every 8 (eight) hours as needed for nausea or vomiting.   promethazine (PHENERGAN) 25 MG suppository Place 1 suppository (25 mg total) rectally every 6 (six) hours as needed for nausea or vomiting.   RITALIN 5 MG tablet Take 1 tablet (5 mg total) by mouth daily as needed.   No facility-administered encounter medications on file as of 05/21/2021.    Review of Systems  Constitutional:   Negative for appetite change, chills, fatigue, fever and unexpected weight change.  Respiratory:  Negative for apnea, cough, shortness of breath and wheezing.   Cardiovascular:  Negative for chest pain, palpitations and leg swelling.  Gastrointestinal:  Negative for abdominal distention, abdominal pain, blood in stool, constipation, diarrhea, nausea and vomiting.  Genitourinary:  Negative for difficulty urinating, dysuria, flank pain, frequency, hematuria and urgency.  Musculoskeletal:  Positive for arthralgias, back pain and gait problem. Negative for joint swelling, myalgias, neck pain and neck stiffness.  Skin:  Negative for color change, pallor and rash.  Neurological:  Negative for dizziness, speech difficulty, weakness, light-headedness, numbness and headaches.  Psychiatric/Behavioral:  Negative for agitation, confusion and sleep disturbance. The patient is not nervous/anxious.  Immunization History  Administered Date(s) Administered   Influenza, High Dose Seasonal PF 06/09/2018, 09/07/2019   PFIZER(Purple Top)SARS-COV-2 Vaccination 11/19/2019, 02/12/2020   Pneumococcal Conjugate-13 08/22/2015   Pneumococcal Polysaccharide-23 11/10/2020   Pertinent  Health Maintenance Due  Topic Date Due   MAMMOGRAM  09/08/2013   DEXA SCAN  Never done   INFLUENZA VACCINE  02/16/2021   COLONOSCOPY (Pts 45-98yrs Insurance coverage will need to be confirmed)  12/01/2021   Fall Risk 03/04/2021 03/05/2021 03/11/2021 04/29/2021 05/21/2021  Falls in the past year? 0 1 1 - 0  Was there an injury with Fall? 0 1 1 - 0  Fall Risk Category Calculator 0 3 3 - 0  Fall Risk Category Low High High - Low  Patient Fall Risk Level Low fall risk High fall risk High fall risk High fall risk Low fall risk  Patient at Risk for Falls Due to No Fall Risks History of fall(s) History of fall(s) - No Fall Risks  Fall risk Follow up Falls evaluation completed Falls evaluation completed Falls evaluation completed - Falls  evaluation completed   Functional Status Survey:    Vitals:   05/21/21 1526  BP: 120/84  Pulse: 98  Resp: 16  Temp: (!) 97.5 F (36.4 C)  SpO2: 94%  Weight: 128 lb (58.1 kg)  Height: 5' (1.524 m)   Body mass index is 25 kg/m. Physical Exam Vitals reviewed.  Constitutional:      General: She is not in acute distress.    Appearance: Normal appearance. She is normal weight. She is not ill-appearing or diaphoretic.  HENT:     Head: Normocephalic.  Eyes:     General: No scleral icterus.       Right eye: No discharge.        Left eye: No discharge.     Conjunctiva/sclera: Conjunctivae normal.     Pupils: Pupils are equal, round, and reactive to light.  Cardiovascular:     Rate and Rhythm: Normal rate and regular rhythm.     Pulses: Normal pulses.     Heart sounds: Normal heart sounds. No murmur heard.   No friction rub. No gallop.  Pulmonary:     Effort: Pulmonary effort is normal. No respiratory distress.     Breath sounds: Normal breath sounds. No wheezing, rhonchi or rales.  Chest:     Chest wall: No tenderness.  Abdominal:     General: Bowel sounds are normal. There is no distension.     Palpations: Abdomen is soft. There is no mass.     Tenderness: There is no abdominal tenderness. There is no right CVA tenderness, left CVA tenderness, guarding or rebound.  Musculoskeletal:        General: No swelling or tenderness.     Cervical back: Normal range of motion. No rigidity or tenderness.     Right lower leg: No edema.     Left lower leg: No edema.     Right foot: Normal range of motion and normal capillary refill. Bunion present. Normal pulse.     Left foot: Normal range of motion and normal capillary refill. Bunion present. Normal pulse.     Comments: Unsteady gait   Feet:     Right foot:     Skin integrity: No ulcer, skin breakdown, erythema or dry skin.     Left foot:     Skin integrity: Callus present. No ulcer, skin breakdown, erythema or dry skin.   Lymphadenopathy:     Cervical: No  cervical adenopathy.  Skin:    General: Skin is warm and dry.     Coloration: Skin is not pale.     Findings: No bruising, erythema, lesion or rash.  Neurological:     Mental Status: She is alert and oriented to person, place, and time.     Cranial Nerves: No cranial nerve deficit.     Sensory: No sensory deficit.     Motor: No weakness.     Coordination: Coordination normal.     Gait: Gait abnormal.  Psychiatric:        Mood and Affect: Mood normal.        Speech: Speech normal.        Behavior: Behavior normal.        Thought Content: Thought content is delusional.    Labs reviewed: Recent Labs    12/24/20 1019 04/16/21 1415 04/29/21 1400  NA 139 139 144  K 4.5 4.4 4.3  CL 104 104 108  CO2 27 27 27   GLUCOSE 104* 92 98  BUN 18 15 16   CREATININE 0.86 0.77 0.70  CALCIUM 9.7 9.6 9.9   Recent Labs    12/24/20 1019 04/16/21 1415 04/29/21 1400  AST 17 15 26   ALT 13 13 19   ALKPHOS  --   --  85  BILITOT 0.5 0.5 0.7  PROT 7.0 6.8 7.7  ALBUMIN  --   --  4.3   Recent Labs    12/24/20 1019 04/16/21 1415 04/29/21 1400  WBC 5.3 6.5 4.9  NEUTROABS 3,636 4,680 3.5  HGB 14.3 14.4 15.0  HCT 44.7 43.2 44.8  MCV 89.0 89.1 89.1  PLT 299 308 289   Lab Results  Component Value Date   TSH 1.51 04/16/2021   No results found for: HGBA1C Lab Results  Component Value Date   CHOL 164 11/17/2020   HDL 78 11/17/2020   LDLCALC 72 11/17/2020   TRIG 49 11/17/2020   CHOLHDL 2.1 11/17/2020    Significant Diagnostic Results in last 30 days:  DG Pelvis 1-2 Views  Result Date: 04/29/2021 CLINICAL DATA:  Multiple falls, pain EXAM: PELVIS - 1-2 VIEW COMPARISON:  05/14/2020, 12/03/2020 FINDINGS: 2 frontal views of the pelvis demonstrate bilateral hip arthroplasties. 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 The right hip arthroplasty is well aligned. There are no acute displaced fractures.  Postsurgical changes are seen at L4-5 and L5-S1. IMPRESSION: 1. 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 2. No acute fracture. Electronically Signed   By: Randa Ngo M.D.   On: 04/29/2021 15:35   CT Head Wo Contrast  Result Date: 04/29/2021 CLINICAL DATA:  Poly trauma. EXAM: CT HEAD WITHOUT CONTRAST CT CERVICAL SPINE WITHOUT CONTRAST TECHNIQUE: Multidetector CT imaging of the head and cervical spine was performed following the standard protocol without intravenous contrast. Multiplanar CT image reconstructions of the cervical spine were also generated. COMPARISON:  CT head and cervical spine 03/27/2021 FINDINGS: CT HEAD FINDINGS Brain: No evidence of acute infarction, hemorrhage, hydrocephalus, extra-axial collection or mass lesion/mass effect. Mild white matter hypodensity bilaterally, chronic and unchanged. Vascular: Negative for hyperdense vessel Skull: Negative Sinuses/Orbits: Negative Other: None CT CERVICAL SPINE FINDINGS Alignment: 3 mm anterolisthesis C2-3 unchanged. Mild retrolisthesis C3-4 and C4-5 unchanged. Skull base and vertebrae: Negative for fracture Soft tissues and spinal canal: No soft tissue mass or edema. Disc levels: Advanced degenerative changes C1-2 and C2-3. There is mild spinal stenosis at  C2 related at anterolisthesis of posterior arch of C1 relative to C2. This is unchanged. ACDF with hardware at C5-6. Solid interbody fusion C3 through C7. Advanced degenerative change in the upper thoracic spine. Upper chest: Lung apices clear bilaterally. Other: None IMPRESSION: No acute intracranial abnormality Advanced cervical spondylosis.  Negative for fracture. Electronically Signed   By: Franchot Gallo M.D.   On: 04/29/2021 14:51   CT Cervical Spine Wo Contrast  Result Date: 04/29/2021 CLINICAL DATA:  Poly trauma. EXAM: CT HEAD WITHOUT CONTRAST CT CERVICAL SPINE WITHOUT CONTRAST TECHNIQUE: Multidetector CT imaging of the head and  cervical spine was performed following the standard protocol without intravenous contrast. Multiplanar CT image reconstructions of the cervical spine were also generated. COMPARISON:  CT head and cervical spine 03/27/2021 FINDINGS: CT HEAD FINDINGS Brain: No evidence of acute infarction, hemorrhage, hydrocephalus, extra-axial collection or mass lesion/mass effect. Mild white matter hypodensity bilaterally, chronic and unchanged. Vascular: Negative for hyperdense vessel Skull: Negative Sinuses/Orbits: Negative Other: None CT CERVICAL SPINE FINDINGS Alignment: 3 mm anterolisthesis C2-3 unchanged. Mild retrolisthesis C3-4 and C4-5 unchanged. Skull base and vertebrae: Negative for fracture Soft tissues and spinal canal: No soft tissue mass or edema. Disc levels: Advanced degenerative changes C1-2 and C2-3. There is mild spinal stenosis at C2 related at anterolisthesis of posterior arch of C1 relative to C2. This is unchanged. ACDF with hardware at C5-6. Solid interbody fusion C3 through C7. Advanced degenerative change in the upper thoracic spine. Upper chest: Lung apices clear bilaterally. Other: None IMPRESSION: No acute intracranial abnormality Advanced cervical spondylosis.  Negative for fracture. Electronically Signed   By: Franchot Gallo M.D.   On: 04/29/2021 14:51   DG ESOPHAGUS W SINGLE CM (SOL OR THIN BA)  Result Date: 05/14/2021 CLINICAL DATA:  Dysphagia.  Nausea and vomiting. EXAM: ESOPHOGRAM/BARIUM SWALLOW TECHNIQUE: Single contrast examination was performed using thin barium or water soluble. FLUOROSCOPY TIME:  Fluoroscopy Time:  1 minute and 36 seconds. Radiation Exposure Index (if provided by the fluoroscopic device): 6.6 mGy Number of Acquired Spot Images: COMPARISON:  None. FINDINGS: Due to patient mobility issues, exam was performed with the fluoro table in a 45-60 degree head up position and the patient RAO relative to the fluoro table. No evidence for gross esophageal mass lesion. No  diverticulum or gross penetrating ulcer. Tiny hiatal hernia evident. Fluoro table was placed horizontal for assessment of esophageal motility. This reveals disruption of primary peristalsis in the middle third of the esophagus consistent with nonspecific esophageal motility disorder. Patient was given a 13 mm barium tablet with water. Fluoro table and 75-80 degree head up positioning. Tablet passes into the stomach. IMPRESSION: 1. Nonspecific esophageal motility disorder. 2. Tiny hiatal hernia. Electronically Signed   By: Misty Stanley M.D.   On: 05/14/2021 10:26    Assessment/Plan 1. Chronic vomiting Occasional  Continue on Reglan ,Zofran and phenergan supp - ondansetron (ZOFRAN) 4 MG tablet; Take 1 tablet (4 mg total) by mouth every 8 (eight) hours as needed for nausea or vomiting.  Dispense: 20 tablet; Refill: 1 - promethazine (PHENERGAN) 25 MG suppository; Place 1 suppository (25 mg total) rectally every 6 (six) hours as needed for nausea or vomiting.  Dispense: 12 each; Refill: 0  2. Gastroesophageal reflux disease, unspecified whether esophagitis present Continue on antiemetic  - continue to follow up GI seen last 05/05/2021 - ondansetron (ZOFRAN) 4 MG tablet; Take 1 tablet (4 mg total) by mouth every 8 (eight) hours as needed for nausea or vomiting.  Dispense:  20 tablet; Refill: 1  3. Chronic right-sided low back pain without sciatica Continue on Robaxin,Flexeril  and diclofenac  - continue to follow up with pain management specialist  - awaiting MRI results to discuss with pain management  - methocarbamol (ROBAXIN) 500 MG tablet; Take 1 tablet (500 mg total) by mouth as needed for muscle spasms. 1-2 tablets PRN  Dispense: 30 tablet; Refill: 3  4. Attention deficit hyperactivity disorder (ADHD), unspecified ADHD type Request refill  Controlled substance use contract in place  - RITALIN 5 MG tablet; Take 1 tablet (5 mg total) by mouth daily as needed.  Dispense: 30 tablet; Refill:  0   Family/ staff Communication: Reviewed plan of care with patient verbalized understanding   Labs/tests ordered: None   Next Appointment: As needed if symptoms worsen or fail to improve    Sandrea Hughs, NP

## 2021-05-25 ENCOUNTER — Ambulatory Visit: Payer: Medicare Other | Admitting: Podiatry

## 2021-06-16 ENCOUNTER — Other Ambulatory Visit: Payer: Self-pay | Admitting: Orthopedic Surgery

## 2021-06-16 ENCOUNTER — Telehealth: Payer: Self-pay

## 2021-06-16 ENCOUNTER — Other Ambulatory Visit: Payer: Self-pay | Admitting: Family

## 2021-06-16 DIAGNOSIS — F909 Attention-deficit hyperactivity disorder, unspecified type: Secondary | ICD-10-CM

## 2021-06-16 DIAGNOSIS — F331 Major depressive disorder, recurrent, moderate: Secondary | ICD-10-CM

## 2021-06-16 MED ORDER — RITALIN LA 20 MG PO CP24: ORAL_CAPSULE | ORAL | 0 refills | Status: DC

## 2021-06-16 MED ORDER — RITALIN 5 MG PO TABS: 5.0000 mg | ORAL_TABLET | Freq: Every day | ORAL | 0 refills | Status: DC | PRN

## 2021-06-16 MED ORDER — SERTRALINE HCL 25 MG PO TABS: 25.0000 mg | ORAL_TABLET | Freq: Every day | ORAL | 0 refills | Status: DC

## 2021-06-16 NOTE — Telephone Encounter (Signed)
Below is Monina's reply:  Medina-Vargas, Monina C, NP  You 1 hour ago (12:39 PM)   There is no documentation that she was on Zoloft nor Ritalin 10 mg PRN. We can only order what we have on the system.    Left message on voicemail for patient to return call when available

## 2021-06-16 NOTE — Telephone Encounter (Signed)
1.) Patient returned called and stated she will deal with the Ritalin 5 mg rx for now.  2.) Patient states there is documentation that she was on zoloft and the GI doctor removed it off her list in error back in October 2022 and she has no idea why and would like it reinstated now. Patient began to cry and said she desperately needs her zoloft.   3.) Patient is also asking for a refill on her ritalin 20 mg, last refilled on 05/04/21 and treatment agreement on file from August 2022

## 2021-06-16 NOTE — Telephone Encounter (Signed)
Incoming call received from patient stating she needs a refill on   1.) Ritlan 10 mg, as needed (not on current medication list) Patient states Dinah sent in 5 mg in error and that needs to be correct (patient states she never picked up the 5 mg).   2.) Patient is also requesting a refill on Zoloft, which is not on her active medication list. I told patient that zoloft was removed on 05/05/2021 at the GI office. Patient states she has no idea why they removed it and she needs it now.  Patient is aware Carilyn Goodpasture is out of office and Monina who is covering her message will review and reply.  Monina you will need to check the [place] database regarding Ritalin dosing (controlled substance)

## 2021-06-16 NOTE — Addendum Note (Signed)
Addended by: Kenard Gower C on: 06/16/2021 05:36 PM   Modules accepted: Orders

## 2021-06-16 NOTE — Addendum Note (Signed)
Addended by: Maurice Small on: 06/16/2021 03:50 PM   Modules accepted: Orders

## 2021-06-16 NOTE — Telephone Encounter (Signed)
Patient has request refill on medication "Ratalin". Patient last refill on medication was 05/04/2021. Patient has Non Opioid Contract on file dated 03/17/2021. Patient medication pend and sent to Hazle Nordmann, NP due to PCP Ngetich, Donalee Citrin, NP being out of office. Please Advise.

## 2021-06-17 ENCOUNTER — Emergency Department (HOSPITAL_COMMUNITY): Payer: Medicare Other

## 2021-06-17 ENCOUNTER — Other Ambulatory Visit: Payer: Self-pay

## 2021-06-17 ENCOUNTER — Emergency Department (HOSPITAL_COMMUNITY)
Admission: EM | Admit: 2021-06-17 | Discharge: 2021-06-18 | Disposition: A | Discharge status: Home / Self Care | Payer: Medicare Other | Attending: Emergency Medicine | Admitting: Emergency Medicine

## 2021-06-17 ENCOUNTER — Encounter (HOSPITAL_COMMUNITY): Payer: Self-pay | Admitting: Emergency Medicine

## 2021-06-17 DIAGNOSIS — Y900 Blood alcohol level of less than 20 mg/100 ml: Secondary | ICD-10-CM | POA: Diagnosis not present

## 2021-06-17 DIAGNOSIS — R531 Weakness: Secondary | ICD-10-CM | POA: Diagnosis present

## 2021-06-17 DIAGNOSIS — Z79899 Other long term (current) drug therapy: Secondary | ICD-10-CM | POA: Insufficient documentation

## 2021-06-17 DIAGNOSIS — N189 Chronic kidney disease, unspecified: Secondary | ICD-10-CM | POA: Diagnosis not present

## 2021-06-17 DIAGNOSIS — Z789 Other specified health status: Secondary | ICD-10-CM

## 2021-06-17 DIAGNOSIS — M25551 Pain in right hip: Secondary | ICD-10-CM | POA: Insufficient documentation

## 2021-06-17 DIAGNOSIS — E039 Hypothyroidism, unspecified: Secondary | ICD-10-CM | POA: Diagnosis not present

## 2021-06-17 DIAGNOSIS — Z20822 Contact with and (suspected) exposure to covid-19: Secondary | ICD-10-CM | POA: Diagnosis not present

## 2021-06-17 DIAGNOSIS — R2990 Unspecified symptoms and signs involving the nervous system: Secondary | ICD-10-CM | POA: Diagnosis not present

## 2021-06-17 DIAGNOSIS — M25559 Pain in unspecified hip: Secondary | ICD-10-CM

## 2021-06-17 LAB — COMPREHENSIVE METABOLIC PANEL
ALT: 21 U/L (ref 0–44)
AST: 23 U/L (ref 15–41)
Albumin: 3.9 g/dL (ref 3.5–5.0)
Alkaline Phosphatase: 96 U/L (ref 38–126)
Anion gap: 8 (ref 5–15)
BUN: 13 mg/dL (ref 8–23)
CO2: 26 mmol/L (ref 22–32)
Calcium: 9.5 mg/dL (ref 8.9–10.3)
Chloride: 105 mmol/L (ref 98–111)
Creatinine, Ser: 0.93 mg/dL (ref 0.44–1.00)
GFR, Estimated: >60 mL/min (ref >60)
Glucose, Bld: 88 mg/dL (ref 70–99)
Potassium: 4.1 mmol/L (ref 3.5–5.1)
Sodium: 139 mmol/L (ref 135–145)
Total Bilirubin: 0.4 mg/dL (ref 0.3–1.2)
Total Protein: 7.1 g/dL (ref 6.5–8.1)

## 2021-06-17 LAB — I-STAT CHEM 8, ED
BUN: 15 mg/dL (ref 8–23)
Calcium, Ion: 1.16 mmol/L (ref 1.15–1.40)
Chloride: 104 mmol/L (ref 98–111)
Creatinine, Ser: 0.9 mg/dL (ref 0.44–1.00)
Glucose, Bld: 86 mg/dL (ref 70–99)
HCT: 43 % (ref 36.0–46.0)
Hemoglobin: 14.6 g/dL (ref 12.0–15.0)
Potassium: 4.1 mmol/L (ref 3.5–5.1)
Sodium: 143 mmol/L (ref 135–145)
TCO2: 26 mmol/L (ref 22–32)

## 2021-06-17 LAB — DIFFERENTIAL
Abs Immature Granulocytes: 0.04 10*3/uL (ref 0.00–0.07)
Basophils Absolute: 0.1 10*3/uL (ref 0.0–0.1)
Basophils Relative: 1 %
Eosinophils Absolute: 0.1 10*3/uL (ref 0.0–0.5)
Eosinophils Relative: 1 %
Immature Granulocytes: 0 %
Lymphocytes Relative: 14 %
Lymphs Abs: 1.3 10*3/uL (ref 0.7–4.0)
Monocytes Absolute: 0.7 10*3/uL (ref 0.1–1.0)
Monocytes Relative: 8 %
Neutro Abs: 7.1 10*3/uL (ref 1.7–7.7)
Neutrophils Relative %: 76 %

## 2021-06-17 LAB — CBC
HCT: 43.3 % (ref 36.0–46.0)
Hemoglobin: 14.1 g/dL (ref 12.0–15.0)
MCH: 29.3 pg (ref 26.0–34.0)
MCHC: 32.6 g/dL (ref 30.0–36.0)
MCV: 90 fL (ref 80.0–100.0)
Platelets: 317 10*3/uL (ref 150–400)
RBC: 4.81 MIL/uL (ref 3.87–5.11)
RDW: 13.4 % (ref 11.5–15.5)
WBC: 9.3 10*3/uL (ref 4.0–10.5)
nRBC: 0 % (ref 0.0–0.2)

## 2021-06-17 LAB — RESP PANEL BY RT-PCR (FLU A&B, COVID) ARPGX2
Influenza A by PCR: NEGATIVE
Influenza B by PCR: NEGATIVE
SARS Coronavirus 2 by RT PCR: NEGATIVE

## 2021-06-17 LAB — CBG MONITORING, ED
Glucose-Capillary: 91 mg/dL (ref 70–99)
Glucose-Capillary: 94 mg/dL (ref 70–99)

## 2021-06-17 LAB — PROTIME-INR
INR: 1 (ref 0.8–1.2)
Prothrombin Time: 13.5 seconds (ref 11.4–15.2)

## 2021-06-17 LAB — ETHANOL: Alcohol, Ethyl (B): <10 mg/dL (ref <10)

## 2021-06-17 LAB — APTT: aPTT: 27 seconds (ref 24–36)

## 2021-06-17 MED ORDER — HYDROMORPHONE HCL 1 MG/ML IJ SOLN
1.0000 mg | Freq: Once | INTRAMUSCULAR | Status: AC
  Administered 2021-06-17: 1 mg via INTRAVENOUS
  Filled 2021-06-17: qty 1

## 2021-06-17 MED ORDER — ASPIRIN EC 325 MG PO TBEC: 325.0000 mg | DELAYED_RELEASE_TABLET | Freq: Once | ORAL | Status: DC

## 2021-06-17 MED ORDER — IOHEXOL 350 MG/ML SOLN
100.0000 mL | Freq: Once | INTRAVENOUS | Status: AC | PRN
  Administered 2021-06-17: 100 mL via INTRAVENOUS

## 2021-06-17 MED ORDER — ASPIRIN EC 81 MG PO TBEC: 81.0000 mg | DELAYED_RELEASE_TABLET | Freq: Every day | ORAL | Status: DC

## 2021-06-17 MED ORDER — DIAZEPAM 5 MG/ML IJ SOLN
5.0000 mg | Freq: Once | INTRAMUSCULAR | Status: AC
  Administered 2021-06-17: 5 mg via INTRAVENOUS
  Filled 2021-06-17: qty 2

## 2021-06-17 NOTE — Code Documentation (Addendum)
Pt is a 70 yr old woman with PMH of ADHD,Chronic pain, DVT,Migraine. She has been c/o  Left sided weakness and loss of sensation since yesterday at 1600. She arrived [facility] at 1317, and code stroke was activated at 1356. Pt sent for stat CTH. This study was neg for acute hemorrhage per Dr Otelia Limes. CTA and P ordered. Some delay in obtaining IV access for perfusion scan. CTA and P obtained. Pt returned to rescus room. Pt NIHSS 6 (see flowsheet for additional times and details). Pt not eligible for TNK as OOW. Pt not eligible for NIR as LVO negative. Pt will need q 2 hr VS and NIHSS. Handoff with ED RN complete.

## 2021-06-17 NOTE — ED Notes (Signed)
The edp  talked to the pt  she is still on for the mri  but the pt is c/o a dry mouth   the edp asked me to allow the pt to swish some water in her mouth  and spit it out.  I took a medicine cup with 2.5 ml  water that she swished in her mouth and spit it back out in a cup

## 2021-06-17 NOTE — Discharge Instructions (Addendum)
Thank you for seeing [place] today.  You were evaluated by Neurology, had CT of your head and your neck with and without contrast, MRI brain that do not show signs of acute stroke or other acute pathology. A CT of your pelvis showed unchanged chronic dislocation and abnormality of the left hip. For this, you will need continued follow up with Orthopedics.  Recommend continued follow up with your PCP, orthopedics and an ID physician as desired for your other concerns. It was a pleasure caring for you!

## 2021-06-17 NOTE — ED Notes (Signed)
Charge RN notified EDP calling code stroke on patient.

## 2021-06-17 NOTE — ED Notes (Signed)
The pt stopped drinking failed the swallow screen

## 2021-06-17 NOTE — ED Notes (Signed)
The pt reports that she would like to go home and return for the mri tomorrow,  she wants to talk to the edp

## 2021-06-17 NOTE — ED Notes (Signed)
The pt is planing on going home  after the mri  she has called her family

## 2021-06-17 NOTE — ED Notes (Signed)
The pt returned from mri  asking for graham crackers  the edp told her she could have graham crackers and apple juice

## 2021-06-17 NOTE — ED Provider Notes (Signed)
Emergency Medicine Provider Triage Evaluation Note  [name] , a 70 y.o. female  was evaluated in triage.  Pt complains of concern for stroke and fall at home.   Reports yesterday at 2pm she started having visual changes, ataxia, left sided weakness and aphasia. States that her vision has somewhat improved but her sxs are not completely resolved.   She also reports a fall yesterday and has bilat hip pain.  Review of Systems  Positive: Vision changes, ataxia, left sided weakness, aphasia, hip pain Negative: fever  Physical Exam  BP 130/70 (BP Location: Left Arm)   Pulse 91   Temp 98.9 F (37.2 C) (Oral)   Resp 20   Ht 5' (1.524 m)   Wt 58.1 kg   LMP 07/19/2000 (Approximate)   SpO2 96%   BMI 25.00 kg/m  Gen:   Awake, no distress   Resp:  Normal effort  Other:  Mild expressive aphasia, no obvious visual deficits, LUE pronator drift, decreased sensation to the left side of the face, lue and lle. No facial droop.   Medical Decision Making  Medically screening exam initiated at 1:33 PM.  Appropriate orders placed.  Janeen Watson was informed that the remainder of the evaluation will be completed by another provider, this initial triage assessment does not replace that evaluation, and the importance of remaining in the ED until their evaluation is complete.  Weakness + aphasia, VAN+ and pt within the 24 hour window for LVO. Code stroke intiated   Rayne Du 06/17/21 1336    Blane Ohara, MD 06/18/21 1120

## 2021-06-17 NOTE — ED Notes (Signed)
[facility] called to activate code stroke per Dr. Vernice Jefferson request, spoke with Gala Romney.

## 2021-06-17 NOTE — ED Notes (Signed)
The pt is unhappy that  she did not pass here swallow screen  she has asked everybody that has come near her to get her some water.  I have explained to her many times why she has to be checked  with a slp before she can have oral med or fluids

## 2021-06-17 NOTE — ED Triage Notes (Signed)
Patient reports multiple falls over the past few months. States she was on the phone with her orthopedic doctor yesterday and was having left sided weakness, vision changes, aphasia and left head pain approx 2pm.

## 2021-06-17 NOTE — ED Notes (Signed)
To mri 

## 2021-06-17 NOTE — ED Notes (Signed)
The pt  c/o a headache and hip pain.  The pt reports that she has had difficulty swallowing for 2 months no swalow performed

## 2021-06-17 NOTE — Consult Note (Signed)
NEURO HOSPITALIST CONSULT NOTE   Requestig physician: Dr. Lockie Mola  Reason for Consult: Acute onset of aphasia  History obtained from:  Patient, EMS and Chart     HPI:                                                                                                                                          [name] is an 70 y.o. female who presented to the ED with complaints of possible stroke with left sided weakness after a fall at home. She states that she started having visual symptoms at 2 PM yesterday with ataxia, left sided weakness and aphasia. Later, she changed the time of onset to 4 PM. She endorses multiple falls over the past 3 weeks due to incoordination, but the one yesterday was different as she felt as though her left side had given way on her. Of note, the time course of the falls described to Neurology was 3 weeks, whereas she stated "several months" to another provider. At the time of her initial evaluation by the ED team, she stated that her vision had somewhat improved but her other symptoms not completely resolved. With Neurology, she endorsed having experienced all of the above except for ataxia and vision changes. She seemed to shift focus to her right hip pain at the time of the neurology evaluation, stating that she thought she had fractured her right hip. She also has left hip pain associated with failure of a prior hip replacement there. With Neurology, she also endorses some new left sided patchy sensory numbness involving her LLE below the knee, and the left side of her frontal scalp.   Her PMHx as reviewed below is notable for anemia, DVT, Ekborn's delusional parasitosis, Lyme disease, migraine with aura and depression.   Past Medical History:  Diagnosis Date   ADHD    Anemia    Cellulitis 07/2018   left lower extremity   Chronic kidney disease    surgery to fix   Chronic pain    Depression    major depression   DVT (deep venous  thrombosis) (HCC)    Ekbom's delusional parasitosis (HCC)    Fibromyalgia    History of Zenker's diverticulum removal    Lyme disease    Migraine with aura    Multiple falls    Thyroid disease    hypothyroid    Past Surgical History:  Procedure Laterality Date   APPENDECTOMY  1974   Avascular necrosis of the hip     multiple operations starting in 2005.  bilateral hips   Back fusion  1999   CERVICAL FUSION  [id number]   EYE SURGERY     eye muscle   KIDNEY SURGERY  1971   kidney reconstruction   LAMINECTOMY  1985   TOTAL HIP ARTHROPLASTY      Family History  Problem Relation Age of Onset   Cancer Sister    Hemochromatosis Sister    Cancer Mother        cancr of unknown origin   Colon cancer Neg Hx    Stomach cancer Neg Hx    Esophageal cancer Neg Hx              Social History:  reports that she has never smoked. She has never used smokeless tobacco. She reports that she does not currently use alcohol. She reports that she does not use drugs.  Allergies  Allergen Reactions   Amoxicillin-Pot Clavulanate Nausea And Vomiting and Other (See Comments)   Clarithromycin Other (See Comments)    DOES NOT REMEMBER REACTION DOES NOT REMEMBER REACTION    Fentanyl Nausea And Vomiting and Other (See Comments)    FENTANYL PATCH FENTANYL PATCH FENTANYL PATCH    Levofloxacin Swelling    Face,eye swelling Face,eye swelling    Lorazepam Other (See Comments)    DO NOT PUT ANY ATIVAN ON PATIENT'S IV POST SURGERY! DO NOT PUT ANY ATIVAN ON PATIENT'S IV POST SURGERY!    Doxycycline Nausea And Vomiting   Penicillins Hives   Sulfa Antibiotics Other (See Comments)    Per pt: unknown    MEDICATIONS:                                                                                                                      No current facility-administered medications on file prior to encounter.   Current Outpatient Medications on File Prior to Encounter  Medication Sig Dispense Refill    betamethasone valerate ointment (VALISONE) 0.1 % Apply 1 application topically 2 (two) times daily. Use for 2 weeks for a flare.  Apply twice a week at bedtime for maintenance dosing. 45 g 1   buPROPion HCl ER, XL, 450 MG TB24 TAKE ONE TABLET EACH DAY 30 tablet 3   cyclobenzaprine (FLEXERIL) 5 MG tablet Take 5 mg by mouth in the morning, at noon, and at bedtime.     cyclobenzaprine (FLEXERIL) 5 MG tablet TAKE ONE TABLET EACH DAY AND AS NEEDED FOR MUSCLE SPASM 30 tablet 5   Diclofenac Sodium CR 100 MG 24 hr tablet TAKE ONE TABLET DAILY WITH FOOD 30 tablet 3   methocarbamol (ROBAXIN) 500 MG tablet Take 1 tablet (500 mg total) by mouth as needed for muscle spasms. 1-2 tablets PRN 30 tablet 3   metoCLOPramide (REGLAN) 5 MG tablet Take 1 tablet (5 mg total) by mouth in the morning and at bedtime. 60 tablet 1   NP THYROID 60 MG tablet TAKE ONE TABLET EACH DAY BEFORE BREAKFAST 30 tablet 3   ondansetron (ZOFRAN) 4 MG tablet Take 1 tablet (4 mg total) by mouth every 8 (eight) hours as needed for nausea or vomiting. 20 tablet 1   promethazine (PHENERGAN) 25 MG suppository Place 1 suppository (25 mg total) rectally every 6 (  six) hours as needed for nausea or vomiting. 12 each 0   RITALIN 5 MG tablet Take 1 tablet (5 mg total) by mouth daily as needed. 30 tablet 0   RITALIN LA 20 MG 24 hr capsule TAKE ONE CAPSULE EACH DAY 30 capsule 0   sertraline (ZOLOFT) 25 MG tablet Take 1 tablet (25 mg total) by mouth daily. 30 tablet 0   topiramate (TOPAMAX) 100 MG tablet Take 100 mg by mouth as needed.       ROS:                                                                                                                                       As per HPI.   Blood pressure 135/72, pulse 85, temperature 98.5 F (36.9 C), resp. rate 17, height 5' (1.524 m), weight 58.1 kg, last menstrual period 07/19/2000, SpO2 96 %.   General Examination:                                                                                                        Physical Exam  HEENT-  [place]/AT    Lungs- Respirations unlabored Extremities- No edema  Neurological Examination Mental Status: Awake and alert. Initially with hesitant quality to her speech but becomes more fluent as evaluation progresses. No errors of grammar or syntax. Naming intact. Used wrong hand and did not point upwards when asked "touch your left ear with your right index finger and point to the ceiling". Mild dysarthria in the context of decreased hydration of oral mucosa. Appears anxious. Comprehension intact for complex questions and commands.  Cranial Nerves: II: Temporal visual fields intact with no extinction to DSS. PERRL.   III,IV, VI: EOMI. No nystagmus. No ptosis.  V: Temp sensation decreased on the right VII: No facial droop.  VIII: Hearing intact to voice IX,X: No hypophonia or hoarseness XI: Head is midline XII: Midline tongue extension Motor: RUE 5/5 RLE 5/5 within the limitations of right hip pain limiting her ability to elevate at hip.  LUE: 4+/5 proximally and distally LLE: Limited movement at hip in the context of prior hip instrumentation documented as with dislocation and upwards migration of the proximal end of the left femur prosthesis. LLE is externally rotated with resistance noted on gentle attempt to internally rotate passively. Can extend at knee with 4/5 strength. ADF/APF 4/5.  Sensory: Patchy FT and temp sensation loss to LLE below the knee Deep Tendon Reflexes: 2+ bilateral brachioradialis. 2+  bilateral patellae.  Cerebellar: No ataxia with FNF bilaterally  Gait: Unable to assess   Lab Results: Basic Metabolic Panel: Recent Labs  Lab 06/17/21 1400 06/17/21 1405  NA 139 143  K 4.1 4.1  CL 105 104  CO2 26  --   GLUCOSE 88 86  BUN 13 15  CREATININE 0.93 0.90  CALCIUM 9.5  --     CBC: Recent Labs  Lab 06/17/21 1400 06/17/21 1405  WBC 9.3  --   NEUTROABS 7.1  --   HGB 14.1 14.6  HCT 43.3 43.0  MCV 90.0  --    PLT 317  --     Cardiac Enzymes: No results for input(s): CKTOTAL, CKMB, CKMBINDEX, TROPONINI in the last 168 hours.  Lipid Panel: No results for input(s): CHOL, TRIG, HDL, CHOLHDL, VLDL, LDLCALC in the last 168 hours.  Imaging: CT PELVIS WO CONTRAST  Result Date: 06/17/2021 CLINICAL DATA:  Pain, falls EXAM: CT PELVIS WITHOUT CONTRAST TECHNIQUE: Multidetector CT imaging of the pelvis was performed following the standard protocol without intravenous contrast. COMPARISON:  CT 06/28/2019. FINDINGS: Urinary Tract: There is some contrast material within the right lower pole the right kidney. Otherwise unremarkable. Bowel:  Unremarkable visualized pelvic bowel loops. Vascular/Lymphatic: Aortoiliac atherosclerotic calcifications. No lymphadenopathy. Reproductive:  Obscured by streak artifact. 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 Similar marked destructive changes of the left acetabulum. Extensive associated streak artifact and metallic or cement debris. Unchanged right total hip arthroplasty. No evidence of new complication. Prior L4-L5 and L5-S1 interbody fusion. No evidence of hardware complication. Bony fusion at L3-L4. There is severe bilateral facet arthropathy in the lower lumbar spine. Chronic injury or degenerative change at the anterior right sacrum superiorly. 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 No new complication. Normal alignment of the right hip arthroplasty without evidence of right-sided complication. No evidence of acute pelvic fracture. Electronically Signed   By: Caprice Renshaw M.D.   On: 06/17/2021 15:19   DG Chest Portable 1 View  Result Date: 06/17/2021 CLINICAL DATA:  Fall EXAM: PORTABLE CHEST 1 VIEW COMPARISON:  Chest radiograph 04/22/2015, CTA  chest 07/26/2018 FINDINGS: The cardiomediastinal silhouette is within normal limits There is no focal consolidation or pulmonary edema. There is no pleural effusion or pneumothorax. There is no acute osseous abnormality. No displaced rib fracture is seen. IMPRESSION: No radiographic evidence of acute cardiopulmonary process. Electronically Signed   By: Lesia Hausen M.D.   On: 06/17/2021 15:25   CT HEAD CODE STROKE WO CONTRAST  Result Date: 06/17/2021 CLINICAL DATA:  Code stroke.  Neuro deficit, acute, stroke suspected EXAM: CT HEAD WITHOUT CONTRAST TECHNIQUE: Contiguous axial images were obtained from the base of the skull through the vertex without intravenous contrast. COMPARISON:  None. FINDINGS: Brain: No acute intracranial hemorrhage, mass effect, or edema. Gray-white differentiation is preserved. Patchy low-density in the supratentorial white matter is nonspecific but may reflect minor chronic microvascular ischemic changes. Ventricles and sulci are normal in size and configuration. Vascular: No hyperdense vessel. Skull: Unremarkable. Sinuses/Orbits: Left frontal, anterior ethmoid, and partially imaged maxillary sinus mucosal thickening and opacification. Orbits are unremarkable. Other: Mastoid air cells are clear. ASPECTS [facility] Stroke Program Early CT Score) - Ganglionic level infarction (caudate, lentiform nuclei, internal capsule, insula, M1-M3 cortex): 7 - Supraganglionic infarction (M4-M6 cortex): 3 Total score (0-10 with 10 being normal): 10  IMPRESSION: There is no acute intracranial hemorrhage or evidence of acute infarction. ASPECT score is 10. These results were communicated to Dr. Otelia Limes at 2:06 pm on 06/17/2021 by text page via the [facility] messaging system. Electronically Signed   By: Guadlupe Spanish M.D.   On: 06/17/2021 14:10   CT ANGIO HEAD NECK W WO CM W PERF (CODE STROKE)  Result Date: 06/17/2021 CLINICAL DATA:  Acute neuro deficit.  Falls.  Left-sided weakness. EXAM: CT ANGIOGRAPHY  HEAD AND NECK CT PERFUSION BRAIN TECHNIQUE: Multidetector CT imaging of the head and neck was performed using the standard protocol during bolus administration of intravenous contrast. Multiplanar CT image reconstructions and MIPs were obtained to evaluate the vascular anatomy. Carotid stenosis measurements (when applicable) are obtained utilizing NASCET criteria, using the distal internal carotid diameter as the denominator. Multiphase CT imaging of the brain was performed following IV bolus contrast injection. Subsequent parametric perfusion maps were calculated using RAPID software. CONTRAST:  OMNIPAQUE IOHEXOL 350 MG/ML SOLN COMPARISON:  CT head 06/17/2021 FINDINGS: CTA NECK FINDINGS Aortic arch: Normal aortic arch. Left carotid origin from the innominate artery. Right carotid system: Widely patent right carotid system without atherosclerotic disease or dissection Left carotid system: Widely patent left carotid system without atherosclerotic disease or dissection Vertebral arteries: Both vertebral arteries patent skull base without stenosis. Skeleton: 4 mm anterolisthesis C2-3. Marked degenerative changes C1-2 with bony overgrowth and mild spinal stenosis. Solid bony fusion C3 through C7. Anterior plate fusion Z6-1. No acute skeletal finding. Other neck: Negative for mass or adenopathy Upper chest: Lung apices clear bilaterally. Review of the MIP images confirms the above findings CTA HEAD FINDINGS Anterior circulation: Cavernous carotid widely patent bilaterally. Anterior and middle cerebral arteries widely patent bilaterally. No stenosis or large vessel occlusion identified. Posterior circulation: Both vertebral arteries patent to the basilar. PICA patent bilaterally. Basilar widely patent. Posterior cerebral arteries widely patent. No large vessel occlusion Venous sinuses: Normal venous enhancement Anatomic variants: None Review of the MIP images confirms the above findings CT Brain Perfusion Findings:  ASPECTS: 10 CBF (<30%) Volume: 0mL Perfusion (Tmax>6.0s) volume: 0mL Mismatch Volume: 0mL Infarction Location:None IMPRESSION: 1. CT perfusion negative for acute infarct 2. Negative for intracranial large vessel occlusion 3. No significant atherosclerotic disease in the head or neck. 4. These results were called by telephone at the time of interpretation on 06/17/2021 at 3:02 pm to provider Lylith Bebeau [facility] , who verbally acknowledged these results. Electronically Signed   By: Marlan Palau M.D.   On: 06/17/2021 15:10   CT US GUIDE VASC ACCESS RT NO REPORT  Result Date: 06/17/2021 There is no Radiologist interpretation  for this exam.    Assessment: 70 year old female presenting with new onset of LLE weakness.  1. Exam reveals mild LUE weakness and LLE weakness compromised by pain and failed hip prosthesis. Also with patchy left sided sensory loss. Initially speech was with a stuttering quality, which resolved after completion of CT scans. NIHSS of 6.  2. CT head: There is no acute intracranial hemorrhage or evidence of acute infarction. ASPECT score is 10.  3. CTA of head and neck: Negative for intracranial large vessel occlusion. No significant atherosclerotic disease in the head or neck. 4. CTP: Negative for acute infarct 5. Out of the TNK time window.   Recommendations: 1. MRI brain to assess for possible right cerebral hemisphere stroke. 2. ASA 325 mg x 1 now and then 81 mg po qd.  3. If MRI brain is positive for  stroke while still symptomatic, obtain full stroke work up.   Electronically signed: Dr. Caryl Pina  06/17/2021, 4:47 PM

## 2021-06-17 NOTE — ED Provider Notes (Signed)
[facility] EMERGENCY DEPARTMENT Provider Note   CSN: [id number] Arrival date & time: 06/17/21  1133  An emergency department physician performed an initial assessment on this suspected stroke patient at 1356.  History Chief Complaint  Patient presents with   Weakness    [name] is a 70 y.o. female.  Speech difficulty, vision issues and left sided weakness since 4 pm yesterday. CODE STROKE UPON ARRIVAL TO TRIAGE.  The history is provided by the patient.  Neurologic Problem This is a new problem. The current episode started 12 to 24 hours ago. The problem occurs constantly. The problem has been gradually improving. Pertinent negatives include no chest pain, no abdominal pain, no headaches and no shortness of breath. Nothing aggravates the symptoms. Nothing relieves the symptoms. She has tried nothing for the symptoms. The treatment provided no relief.      Past Medical History:  Diagnosis Date   ADHD    Anemia    Cellulitis 07/2018   left lower extremity   Chronic kidney disease    surgery to fix   Chronic pain    Depression    major depression   DVT (deep venous thrombosis) (HCC)    Ekbom's delusional parasitosis (HCC)    Fibromyalgia    History of Zenker's diverticulum removal    Lyme disease    Migraine with aura    Multiple falls    Thyroid disease    hypothyroid    Patient Active Problem List   Diagnosis Date Noted   Fall 12/17/2020   Lower leg DVT (deep venous thromboembolism), acute, left (HCC) 07/28/2018   Cellulitis of left lower extremity    Leg edema, left 07/26/2018   Anxiety 08/12/2017   Chronic low back pain without sciatica 08/12/2017   Status post revision of total hip 08/12/2017   Multiple allergies 01/26/2017   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   Stricture and stenosis of esophagus 11/30/2013   Throat pain 11/30/2013   Diverticulosis of colon without hemorrhage 10/29/2013   Ekbom's delusional  parasitosis (HCC) 11/26/2011   Allergic rhinitis 03/26/2011   Attention deficit hyperactivity disorder (ADHD) 03/26/2011   Acquired hypothyroidism 03/26/2011   Depression 03/26/2011   Fibromyalgia 03/26/2011   History of deep venous thrombosis 03/26/2011   History of migraine 03/26/2011   Other mechanical complication of prosthetic joint implant 03/26/2011   Gastroesophageal reflux disease 03/26/2011   Zenker's diverticulum 03/26/2011    Past Surgical History:  Procedure Laterality Date   APPENDECTOMY  1974   Avascular necrosis of the hip     multiple operations starting in 2005.  bilateral hips   Back fusion  1999   CERVICAL FUSION  [id number]   EYE SURGERY     eye muscle   KIDNEY SURGERY  1971   kidney reconstruction   LAMINECTOMY  1985   TOTAL HIP ARTHROPLASTY       OB History   No obstetric history on file.     Family History  Problem Relation Age of Onset   Cancer Sister    Hemochromatosis Sister    Cancer Mother        cancr of unknown origin   Colon cancer Neg Hx    Stomach cancer Neg Hx    Esophageal cancer Neg Hx     Social History   Tobacco Use   Smoking status: Never   Smokeless tobacco: Never  Vaping Use   Vaping Use: Never used  Substance Use Topics   Alcohol  use: Not Currently    Comment: rarely   Drug use: No    Home Medications Prior to Admission medications   Medication Sig Start Date End Date Taking? Authorizing Provider  betamethasone valerate ointment (VALISONE) 0.1 % Apply 1 application topically 2 (two) times daily. Use for 2 weeks for a flare.  Apply twice a week at bedtime for maintenance dosing. 01/16/21   Yisroel Ramming, Everardo All, MD  buPROPion HCl ER, XL, 450 MG TB24 TAKE ONE TABLET EACH DAY 04/02/21   Ngetich, Dinah C, NP  cyclobenzaprine (FLEXERIL) 5 MG tablet Take 5 mg by mouth in the morning, at noon, and at bedtime.    [provider]  cyclobenzaprine (FLEXERIL) 5 MG tablet TAKE ONE TABLET EACH DAY AND AS NEEDED FOR  MUSCLE SPASM 05/20/21   Ngetich, Dinah C, NP  Diclofenac Sodium CR 100 MG 24 hr tablet TAKE ONE TABLET DAILY WITH FOOD 05/11/21   Ngetich, Dinah C, NP  methocarbamol (ROBAXIN) 500 MG tablet Take 1 tablet (500 mg total) by mouth as needed for muscle spasms. 1-2 tablets PRN 05/21/21   Ngetich, Dinah C, NP  metoCLOPramide (REGLAN) 5 MG tablet Take 1 tablet (5 mg total) by mouth in the morning and at bedtime. 04/16/21   Yvonna Alanis, NP  NP THYROID 60 MG tablet TAKE ONE TABLET EACH DAY BEFORE BREAKFAST 03/25/21   Ngetich, Dinah C, NP  ondansetron (ZOFRAN) 4 MG tablet Take 1 tablet (4 mg total) by mouth every 8 (eight) hours as needed for nausea or vomiting. 05/21/21   Ngetich, Dinah C, NP  promethazine (PHENERGAN) 25 MG suppository Place 1 suppository (25 mg total) rectally every 6 (six) hours as needed for nausea or vomiting. 05/21/21   Ngetich, Dinah C, NP  RITALIN 5 MG tablet Take 1 tablet (5 mg total) by mouth daily as needed. 06/16/21   Medina-Vargas, Monina C, NP  RITALIN LA 20 MG 24 hr capsule TAKE ONE CAPSULE EACH DAY 06/16/21   Medina-Vargas, Monina C, NP  sertraline (ZOLOFT) 25 MG tablet Take 1 tablet (25 mg total) by mouth daily. 06/16/21   Medina-Vargas, Monina C, NP  topiramate (TOPAMAX) 100 MG tablet Take 100 mg by mouth as needed.    [provider]    Allergies    Amoxicillin-pot clavulanate, Clarithromycin, Fentanyl, Levofloxacin, Lorazepam, Doxycycline, Penicillins, and Sulfa antibiotics  Review of Systems   Review of Systems  Constitutional:  Negative for chills and fever.  HENT:  Negative for ear pain and sore throat.   Eyes:  Positive for visual disturbance. Negative for pain.  Respiratory:  Negative for cough and shortness of breath.   Cardiovascular:  Negative for chest pain and palpitations.  Gastrointestinal:  Negative for abdominal pain and vomiting.  Genitourinary:  Negative for dysuria and hematuria.  Musculoskeletal:  Positive for gait problem. Negative for  arthralgias and back pain.  Skin:  Negative for color change and rash.  Neurological:  Positive for speech difficulty, weakness and numbness. Negative for seizures, syncope and headaches.  All other systems reviewed and are negative.  Physical Exam Updated Vital Signs BP (!) 149/67   Pulse 83   Temp 98.9 F (37.2 C) (Oral)   Resp 19   Ht 5' (1.524 m)   Wt 58.1 kg   LMP 07/19/2000 (Approximate)   SpO2 94%   BMI 25.00 kg/m   Physical Exam Vitals and nursing note reviewed.  Constitutional:      General: She is not in acute distress.  Appearance: She is well-developed. She is not ill-appearing.  HENT:     Head: Normocephalic and atraumatic.     Right Ear: Tympanic membrane normal.     Left Ear: Tympanic membrane normal.     Nose: Nose normal.     Mouth/Throat:     Mouth: Mucous membranes are moist.  Eyes:     Extraocular Movements: Extraocular movements intact.     Conjunctiva/sclera: Conjunctivae normal.     Pupils: Pupils are equal, round, and reactive to light.  Cardiovascular:     Rate and Rhythm: Normal rate and regular rhythm.     Heart sounds: No murmur heard. Pulmonary:     Effort: Pulmonary effort is normal. No respiratory distress.     Breath sounds: Normal breath sounds.  Abdominal:     Palpations: Abdomen is soft.     Tenderness: There is no abdominal tenderness.  Musculoskeletal:        General: Tenderness present. No swelling.     Cervical back: Neck supple.     Comments: Tenderness to right hip  Skin:    General: Skin is warm and dry.     Capillary Refill: Capillary refill takes less than 2 seconds.  Neurological:     General: No focal deficit present.     Mental Status: She is alert.     Comments: Appears weak in the left lower extremity compared to the right upper extremity strength intact, decreased sensation on the left side, normal visual fields, stuttering speech but no obvious aphasia, may be mild just on the left  Psychiatric:        Mood  and Affect: Mood normal.    ED Results / Procedures / Treatments   Labs (all labs ordered are listed, but only abnormal results are displayed) Labs Reviewed  RESP PANEL BY RT-PCR (FLU A&B, COVID) ARPGX2  ETHANOL  PROTIME-INR  APTT  CBC  DIFFERENTIAL  COMPREHENSIVE METABOLIC PANEL  RAPID URINE DRUG SCREEN, HOSP PERFORMED  URINALYSIS, ROUTINE W REFLEX MICROSCOPIC  CBG MONITORING, ED  I-STAT CHEM 8, ED  CBG MONITORING, ED    EKG EKG Interpretation  Date/Time:  Wednesday June 17 2021 13:26:05 EST Ventricular Rate:  90 PR Interval:  190 QRS Duration: 78 QT Interval:  338 QTC Calculation: 413 R Axis:   96 Text Interpretation: Normal sinus rhythm Rightward axis Borderline ECG Confirmed by Ronnald Nian, Conroy Goracke ([id number]) on 06/17/2021 1:48:47 PM  Radiology CT HEAD CODE STROKE WO CONTRAST  Result Date: 06/17/2021 CLINICAL DATA:  Code stroke.  Neuro deficit, acute, stroke suspected EXAM: CT HEAD WITHOUT CONTRAST TECHNIQUE: Contiguous axial images were obtained from the base of the skull through the vertex without intravenous contrast. COMPARISON:  None. FINDINGS: Brain: No acute intracranial hemorrhage, mass effect, or edema. Gray-white differentiation is preserved. Patchy low-density in the supratentorial white matter is nonspecific but may reflect minor chronic microvascular ischemic changes. Ventricles and sulci are normal in size and configuration. Vascular: No hyperdense vessel. Skull: Unremarkable. Sinuses/Orbits: Left frontal, anterior ethmoid, and partially imaged maxillary sinus mucosal thickening and opacification. Orbits are unremarkable. Other: Mastoid air cells are clear. ASPECTS ([place] Stroke Program Early CT Score) - Ganglionic level infarction (caudate, lentiform nuclei, internal capsule, insula, M1-M3 cortex): 7 - Supraganglionic infarction (M4-M6 cortex): 3 Total score (0-10 with 10 being normal): 10 IMPRESSION: There is no acute intracranial hemorrhage or evidence of acute  infarction. ASPECT score is 10. These results were communicated to Dr. Cheral Marker at 2:06 pm on 06/17/2021 by text page via  the [employer] system. Electronically Signed   By: Macy Mis M.D.   On: 06/17/2021 14:10   CT US GUIDE VASC ACCESS RT NO REPORT  Result Date: 06/17/2021 There is no Radiologist interpretation  for this exam.   Procedures Procedures   Medications Ordered in ED Medications  iohexol (OMNIPAQUE) 350 MG/ML injection 100 mL (100 mLs Intravenous Contrast Given 06/17/21 1433)    ED Course  I have reviewed the triage vital signs and the nursing notes.  Pertinent labs & imaging results that were available during my care of the patient were reviewed by me and considered in my medical decision making (see chart for details).    MDM Rules/Calculators/A&P                           Burns Spain here with strokelike symptoms.  Multiple falls recently.  History of TIA, hip injuries in the past.  Code stroke upon arrival.  She has been having difficulty with speech since 4 PM yesterday.  Some vision changes during that time as well as left-sided weakness and numbness.  No signs of LVO on CT imaging.  Neurology was at the bedside.  Somewhat complicated story but will get an MRI to rule out stroke.  CT scan of the pelvis to evaluate for any type of hip injury given multiple falls.  She is has significant hip surgeries in the past.  She is having pain mostly in the right hip.  Lab work thus far is unremarkable.  CT scan of the pelvis and MRI of the brain are pending at time of handoff to oncoming ED staff.  Neurology is following.  MRI to rule out stroke.  She seems deconditioned and having chronic falls and has chronic hip probleMS.  Not a candidate for tPA.  No large vessel occlusion.  This chart was dictated using voice recognition software.  Despite best efforts to proofread,  errors can occur which can change the documentation meaning.   Final Clinical Impression(s) / ED  Diagnoses Final diagnoses:  Weakness  Stroke-like symptoms  Hip pain    Rx / DC Orders ED Discharge Orders     None        Lennice Sites, DO 06/17/21 1504

## 2021-06-17 NOTE — ED Provider Notes (Signed)
  Physical Exam  BP (!) 138/107   Pulse 79   Temp 98.6 F (37 C)   Resp (!) 22   Ht 5' (1.524 m)   Wt 58.1 kg   LMP 07/19/2000 (Approximate)   SpO2 94%   BMI 25.00 kg/m   Physical Exam  ED Course/Procedures     Procedures  MDM  Received care of patient from Dr. Lockie Mola. Please see his note for prior history and care. Briefly, this is a 70yo female who presented with difficulty speaking beginning yesterday around 4PM and lasting until today.  Neurology evaluated as Code Stroke, initial imaging negative.  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 Recommend continued follow up with her orthopedic physician.  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    Labs today do not show acute findings.    MR brain obtained shows no evidence of CVA.  Given duration of symptoms, presentation not consistent with seizure or TIA. Recommend outpatient follow up. Patient discharged in stable condition with understanding of reasons to return.       Alvira Monday, MD 06/18/21 [phone]

## 2021-06-17 NOTE — ED Notes (Signed)
The pt told me that she did not have problems with tight spaces however when she went to mri she told them that she did.  I was called and told she was on her way back  instead of calling and sking for med for her . Mri not donw  I has just given her dilaudid 1 mg iv just before she went over  for a headache ????

## 2021-06-18 ENCOUNTER — Telehealth: Payer: Self-pay | Admitting: Gastroenterology

## 2021-06-22 ENCOUNTER — Telehealth: Payer: Self-pay | Admitting: Gastroenterology

## 2021-06-22 ENCOUNTER — Encounter: Payer: Medicare Other | Admitting: Gastroenterology

## 2021-06-22 ENCOUNTER — Ambulatory Visit: Payer: Medicare Other | Admitting: Family

## 2021-06-22 NOTE — Telephone Encounter (Signed)
Called patient at 1:15 pm to see if she would be coming for her procedure, she stated she had a mini stroke and would be coming.

## 2021-06-23 ENCOUNTER — Encounter: Payer: Self-pay | Admitting: Family

## 2021-06-23 ENCOUNTER — Other Ambulatory Visit: Payer: Self-pay

## 2021-06-23 ENCOUNTER — Ambulatory Visit (INDEPENDENT_AMBULATORY_CARE_PROVIDER_SITE_OTHER): Payer: Medicare Other | Admitting: Family

## 2021-06-23 VITALS — BP 138/88 | HR 87 | Temp 97.5°F | Resp 16 | Ht 60.0 in | Wt 127.8 lb

## 2021-06-23 DIAGNOSIS — J0100 Acute maxillary sinusitis, unspecified: Secondary | ICD-10-CM

## 2021-06-23 DIAGNOSIS — F331 Major depressive disorder, recurrent, moderate: Secondary | ICD-10-CM

## 2021-06-23 MED ORDER — BUPROPION HCL ER (XL) 150 MG PO TB24: ORAL_TABLET | ORAL | 0 refills | Status: DC

## 2021-06-23 MED ORDER — AZITHROMYCIN 250 MG PO TABS: ORAL_TABLET | ORAL | 0 refills | Status: DC

## 2021-06-23 NOTE — Progress Notes (Signed)
Provider: Marlowe Sax FNP-C  Tracey Stewart, Nelda Bucks, NP  Patient Care Team: Ewin Rehberg, Nelda Bucks, NP as PCP - General (Family Medicine)  Extended Emergency Contact Information Primary Emergency Contact: [name] Mobile Phone: [phone] Relation: Son Secondary Emergency Contact: [name],[name],[name] Home Phone: [phone] Relation: Friend  Code Status:  Full Code  Goals of care: Advanced Directive information Advanced Directives 06/23/2021  Does Patient Have a Medical Advance Directive? No  Does patient want to make changes to medical advance directive? -  Would patient like information on creating a medical advance directive? No - Patient declined     Chief Complaint  Patient presents with   Acute Visit    Patient complains of being sick.    HPI:  Pt is a 70 y.o. female seen today for an acute visit for evaluation of left sinus infection for 2-3 weeks.States had Z-pak dose that she took.Had fever but has resolved. Request Z-pak refill states still has some sinus pressure and pain over left face.she denies any fever,chills,runny nose,cough or sore throat.  Has not been taking her Wellbutrin but would like to restart it during the holiday season.   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  bilateral hips   Back fusion  1999   CERVICAL FUSION  [id number]   EYE SURGERY     eye muscle   KIDNEY SURGERY  1971    kidney reconstruction   LAMINECTOMY  1985   TOTAL HIP ARTHROPLASTY      Allergies  Allergen Reactions   Amoxicillin-Pot Clavulanate Nausea And Vomiting and Other (See Comments)   Clarithromycin Other (See Comments)    DOES NOT REMEMBER REACTION DOES NOT REMEMBER REACTION    Fentanyl Nausea And Vomiting and Other (See Comments)    FENTANYL PATCH FENTANYL PATCH FENTANYL PATCH    Levofloxacin Swelling    Face,eye swelling Face,eye swelling    Lorazepam Other (See Comments)    DO NOT PUT ANY ATIVAN ON PATIENT'S IV POST SURGERY! DO NOT PUT ANY ATIVAN ON PATIENT'S IV POST SURGERY!    Doxycycline Nausea And Vomiting   Penicillins Hives   Sulfa Antibiotics Other (See Comments)    Per pt: unknown    Outpatient Encounter Medications as of 06/23/2021  Medication Sig   betamethasone valerate ointment (VALISONE) 0.1 % Apply 1 application topically 2 (two) times daily. Use for 2 weeks for a flare.  Apply twice a week at bedtime for maintenance dosing.   buPROPion HCl ER, XL, 450 MG TB24 TAKE ONE TABLET EACH DAY   cyclobenzaprine (FLEXERIL) 5 MG tablet Take 5 mg by mouth in the morning, at noon, and at bedtime.   cyclobenzaprine (FLEXERIL) 5 MG tablet TAKE ONE TABLET EACH DAY AND AS NEEDED FOR MUSCLE SPASM   Diclofenac Sodium  CR 100 MG 24 hr tablet TAKE ONE TABLET DAILY WITH FOOD   HYDROcodone-acetaminophen (NORCO) 7.5-325 MG tablet Take 1 tablet by mouth 2 (two) times daily as needed.   methocarbamol (ROBAXIN) 500 MG tablet Take 1 tablet (500 mg total) by mouth as needed for muscle spasms. 1-2 tablets PRN   metoCLOPramide (REGLAN) 5 MG tablet Take 1 tablet (5 mg total) by mouth in the morning and at bedtime.   NP THYROID 60 MG tablet TAKE ONE TABLET EACH DAY BEFORE BREAKFAST   ondansetron (ZOFRAN) 4 MG tablet Take 1 tablet (4 mg total) by mouth every 8 (eight) hours as needed for nausea or vomiting.   promethazine (PHENERGAN) 25 MG suppository Place 1 suppository (25 mg total) rectally  every 6 (six) hours as needed for nausea or vomiting.   RITALIN 5 MG tablet Take 1 tablet (5 mg total) by mouth daily as needed.   RITALIN LA 20 MG 24 hr capsule TAKE ONE CAPSULE EACH DAY   sertraline (ZOLOFT) 25 MG tablet Take 1 tablet (25 mg total) by mouth daily.   topiramate (TOPAMAX) 100 MG tablet Take 100 mg by mouth as needed.   No facility-administered encounter medications on file as of 06/23/2021.    Review of Systems  Constitutional:  Negative for appetite change, chills, fatigue, fever and unexpected weight change.  HENT:  Positive for sinus pressure and sinus pain. Negative for congestion, dental problem, ear discharge, ear pain, facial swelling, hearing loss, nosebleeds, postnasal drip, rhinorrhea, sneezing, sore throat, tinnitus and trouble swallowing.   Eyes:  Negative for pain, discharge, redness, itching and visual disturbance.  Respiratory:  Negative for cough, chest tightness, shortness of breath and wheezing.   Cardiovascular:  Negative for chest pain, palpitations and leg swelling.  Genitourinary:  Negative for difficulty urinating, dysuria, flank pain, frequency and urgency.  Skin:  Negative for color change, pallor and rash.  Neurological:  Negative for dizziness, syncope, speech difficulty, weakness, light-headedness, numbness and headaches.   Immunization History  Administered Date(s) Administered   Influenza, High Dose Seasonal PF 06/09/2018, 09/07/2019   PFIZER(Purple Top)SARS-COV-2 Vaccination 11/19/2019, 02/12/2020   Pneumococcal Conjugate-13 08/22/2015   Pneumococcal Polysaccharide-23 11/10/2020   Pertinent  Health Maintenance Due  Topic Date Due   MAMMOGRAM  09/08/2013   INFLUENZA VACCINE  02/16/2021   COLONOSCOPY (Pts 45-73yrs Insurance coverage will need to be confirmed)  12/01/2021   DEXA SCAN  Completed   Fall Risk 03/11/2021 04/29/2021 05/21/2021 06/17/2021 06/23/2021  Falls in the past year? 1 - 0 - 0  Was there an injury with Fall? 1 - 0 - 0   Fall Risk Category Calculator 3 - 0 - 0  Fall Risk Category High - Low - Low  Patient Fall Risk Level High fall risk High fall risk Low fall risk High fall risk Low fall risk  Patient at Risk for Falls Due to History of fall(s) - No Fall Risks - No Fall Risks  Fall risk Follow up Falls evaluation completed - Falls evaluation completed - Falls evaluation completed   Functional Status Survey:    Vitals:   06/23/21 1610  BP: 138/88  Pulse: 87  Resp: 16  Temp: (!) 97.5 F (36.4 C)  SpO2: 97%  Weight: 127 lb 12.8 oz (58 kg)  Height: 5' (1.524 m)   Body mass index is 24.96 kg/m. Physical Exam Vitals reviewed.  Constitutional:      General: She is not in acute distress.    Appearance: Normal appearance. She  is normal weight. She is not ill-appearing or diaphoretic.  HENT:     Head: Normocephalic.     Right Ear: Tympanic membrane, ear canal and external ear normal. There is no impacted cerumen.     Left Ear: Tympanic membrane, ear canal and external ear normal. There is no impacted cerumen.     Nose: No congestion or rhinorrhea.     Right Sinus: Maxillary sinus tenderness present.     Left Sinus: Maxillary sinus tenderness present.     Mouth/Throat:     Mouth: Mucous membranes are moist.     Pharynx: Oropharynx is clear. No oropharyngeal exudate or posterior oropharyngeal erythema.  Eyes:     General: No scleral icterus.       Right eye: No discharge.        Left eye: No discharge.     Extraocular Movements: Extraocular movements intact.     Conjunctiva/sclera: Conjunctivae normal.     Pupils: Pupils are equal, round, and reactive to light.  Neck:     Vascular: No carotid bruit.  Cardiovascular:     Rate and Rhythm: Normal rate and regular rhythm.     Pulses: Normal pulses.     Heart sounds: Normal heart sounds. No murmur heard.   No friction rub. No gallop.  Pulmonary:     Effort: Pulmonary effort is normal. No respiratory distress.     Breath sounds: Normal breath  sounds. No wheezing, rhonchi or rales.  Chest:     Chest wall: No tenderness.  Abdominal:     General: Bowel sounds are normal. There is no distension.     Palpations: Abdomen is soft. There is no mass.     Tenderness: There is no abdominal tenderness. There is no right CVA tenderness, left CVA tenderness, guarding or rebound.  Musculoskeletal:     Cervical back: Normal range of motion. No rigidity or tenderness.  Lymphadenopathy:     Cervical: No cervical adenopathy.  Neurological:     Mental Status: She is alert and oriented to person, place, and time.     Motor: No weakness.     Gait: Gait abnormal.  Psychiatric:        Speech: Speech normal.    Labs reviewed: Recent Labs    04/16/21 1415 04/29/21 1400 06/17/21 1400 06/17/21 1405  NA 139 144 139 143  K 4.4 4.3 4.1 4.1  CL 104 108 105 104  CO2 27 27 26   --   GLUCOSE 92 98 88 86  BUN 15 16 13 15   CREATININE 0.77 0.70 0.93 0.90  CALCIUM 9.6 9.9 9.5  --    Recent Labs    04/16/21 1415 04/29/21 1400 06/17/21 1400  AST 15 26 23   ALT 13 19 21   ALKPHOS  --  85 96  BILITOT 0.5 0.7 0.4  PROT 6.8 7.7 7.1  ALBUMIN  --  4.3 3.9   Recent Labs    04/16/21 1415 04/29/21 1400 06/17/21 1400 06/17/21 1405  WBC 6.5 4.9 9.3  --   NEUTROABS 4,680 3.5 7.1  --   HGB 14.4 15.0 14.1 14.6  HCT 43.2 44.8 43.3 43.0  MCV 89.1 89.1 90.0  --   PLT 308 289 317  --    Lab Results  Component Value Date   TSH 1.51 04/16/2021   No results found for: HGBA1C Lab Results  Component Value Date   CHOL 164 11/17/2020   HDL 78 11/17/2020   LDLCALC 72 11/17/2020  TRIG 49 11/17/2020   CHOLHDL 2.1 11/17/2020    Significant Diagnostic Results in last 30 days:  CT PELVIS WO CONTRAST  Result Date: 06/17/2021 CLINICAL DATA:  Pain, falls EXAM: CT PELVIS WITHOUT CONTRAST TECHNIQUE: Multidetector CT imaging of the pelvis was performed following the standard protocol without intravenous contrast. COMPARISON:  CT 06/28/2019. FINDINGS:  Urinary Tract: There is some contrast material within the right lower pole the right kidney. Otherwise unremarkable. Bowel:  Unremarkable visualized pelvic bowel loops. Vascular/Lymphatic: Aortoiliac atherosclerotic calcifications. No lymphadenopathy. Reproductive:  Obscured by streak artifact. 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 Similar marked destructive changes of the left acetabulum. Extensive associated streak artifact and metallic or cement debris. Unchanged right total hip arthroplasty. No evidence of new complication. Prior L4-L5 and L5-S1 interbody fusion. No evidence of hardware complication. Bony fusion at L3-L4. There is severe bilateral facet arthropathy in the lower lumbar spine. Chronic injury or degenerative change at the anterior right sacrum superiorly. 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 No new complication. Normal alignment of the right hip arthroplasty without evidence of right-sided complication. No evidence of acute pelvic fracture. Electronically Signed   By: Caprice Renshaw M.D.   On: 06/17/2021 15:19   MR BRAIN WO CONTRAST  Result Date: 06/17/2021 CLINICAL DATA:  Acute neurologic deficit left-sided weakness with vision changes EXAM: MRI HEAD WITHOUT CONTRAST TECHNIQUE: Multiplanar, multiecho pulse sequences of the brain and surrounding structures were obtained without intravenous contrast. COMPARISON:  None. FINDINGS: Brain: No acute infarct, mass effect or extra-axial collection. No acute or chronic hemorrhage. There is multifocal hyperintense T2-weighted signal within the white matter. Generalized volume loss without a clear lobar predilection. The midline structures are normal. Vascular: Major flow voids are preserved. Skull and upper cervical spine:  Advanced degenerative changes of C1-2. Sinuses/Orbits:Left maxillary and frontal sinus mucosal disease. Normal orbits. IMPRESSION: 1. No acute intracranial abnormality. 2. Findings of chronic microvascular ischemia and generalized volume loss. Electronically Signed   By: Deatra Robinson M.D.   On: 06/17/2021 23:03   DG Chest Portable 1 View  Result Date: 06/17/2021 CLINICAL DATA:  Fall EXAM: PORTABLE CHEST 1 VIEW COMPARISON:  Chest radiograph 04/22/2015, CTA chest 07/26/2018 FINDINGS: The cardiomediastinal silhouette is within normal limits There is no focal consolidation or pulmonary edema. There is no pleural effusion or pneumothorax. There is no acute osseous abnormality. No displaced rib fracture is seen. IMPRESSION: No radiographic evidence of acute cardiopulmonary process. Electronically Signed   By: Lesia Hausen M.D.   On: 06/17/2021 15:25   CT HEAD CODE STROKE WO CONTRAST  Result Date: 06/17/2021 CLINICAL DATA:  Code stroke.  Neuro deficit, acute, stroke suspected EXAM: CT HEAD WITHOUT CONTRAST TECHNIQUE: Contiguous axial images were obtained from the base of the skull through the vertex without intravenous contrast. COMPARISON:  None. FINDINGS: Brain: No acute intracranial hemorrhage, mass effect, or edema. Gray-white differentiation is preserved. Patchy low-density in the supratentorial white matter is nonspecific but may reflect minor chronic microvascular ischemic changes. Ventricles and sulci are normal in size and configuration. Vascular: No hyperdense vessel. Skull: Unremarkable. Sinuses/Orbits: Left frontal, anterior ethmoid, and partially imaged maxillary sinus mucosal thickening and opacification. Orbits are unremarkable. Other: Mastoid air cells are clear. ASPECTS [facility] Stroke Program Early CT Score) - Ganglionic level infarction (caudate, lentiform nuclei, internal capsule, insula, M1-M3 cortex): 7 - Supraganglionic infarction (M4-M6  cortex): 3 Total score (0-10 with 10 being normal):  10 IMPRESSION: There is no acute intracranial hemorrhage or evidence of acute infarction. ASPECT score is 10. These results were communicated to Dr. Cheral Marker at 2:06 pm on 06/17/2021 by text page via the [facility] messaging system. Electronically Signed   By: Macy Mis M.D.   On: 06/17/2021 14:10   CT ANGIO HEAD NECK W WO CM W PERF (CODE STROKE)  Result Date: 06/17/2021 CLINICAL DATA:  Acute neuro deficit.  Falls.  Left-sided weakness. EXAM: CT ANGIOGRAPHY HEAD AND NECK CT PERFUSION BRAIN TECHNIQUE: Multidetector CT imaging of the head and neck was performed using the standard protocol during bolus administration of intravenous contrast. Multiplanar CT image reconstructions and MIPs were obtained to evaluate the vascular anatomy. Carotid stenosis measurements (when applicable) are obtained utilizing NASCET criteria, using the distal internal carotid diameter as the denominator. Multiphase CT imaging of the brain was performed following IV bolus contrast injection. Subsequent parametric perfusion maps were calculated using RAPID software. CONTRAST:  164mL OMNIPAQUE IOHEXOL 350 MG/ML SOLN COMPARISON:  CT head 06/17/2021 FINDINGS: CTA NECK FINDINGS Aortic arch: Normal aortic arch. Left carotid origin from the innominate artery. Right carotid system: Widely patent right carotid system without atherosclerotic disease or dissection Left carotid system: Widely patent left carotid system without atherosclerotic disease or dissection Vertebral arteries: Both vertebral arteries patent skull base without stenosis. Skeleton: 4 mm anterolisthesis C2-3. Marked degenerative changes C1-2 with bony overgrowth and mild spinal stenosis. Solid bony fusion C3 through C7. Anterior plate fusion [id number]. No acute skeletal finding. Other neck: Negative for mass or adenopathy Upper chest: Lung apices clear bilaterally. Review of the MIP images confirms the above findings CTA HEAD FINDINGS Anterior circulation: Cavernous carotid widely  patent bilaterally. Anterior and middle cerebral arteries widely patent bilaterally. No stenosis or large vessel occlusion identified. Posterior circulation: Both vertebral arteries patent to the basilar. PICA patent bilaterally. Basilar widely patent. Posterior cerebral arteries widely patent. No large vessel occlusion Venous sinuses: Normal venous enhancement Anatomic variants: None Review of the MIP images confirms the above findings CT Brain Perfusion Findings: ASPECTS: 10 CBF (<30%) Volume: 47mL Perfusion (Tmax>6.0s) volume: 74mL Mismatch Volume: 58mL Infarction Location:None IMPRESSION: 1. CT perfusion negative for acute infarct 2. Negative for intracranial large vessel occlusion 3. No significant atherosclerotic disease in the head or neck. 4. These results were called by telephone at the time of interpretation on 06/17/2021 at 3:02 pm to provider ERIC [facility] , who verbally acknowledged these results. Electronically Signed   By: Franchot Gallo M.D.   On: 06/17/2021 15:10   CT US GUIDE VASC ACCESS RT NO REPORT  Result Date: 06/17/2021 There is no Radiologist interpretation  for this exam.   Assessment/Plan  1. Acute non-recurrent maxillary sinusitis Afebrile  Bilateral maxillary tenderness worst on left maxillary. - azithromycin (ZITHROMAX) 250 MG tablet; Take 2 tablets (500 mg ) by mouth x 1 dose then one tablet ( 250 mg ) by mouth daily x 4 days  Dispense: 6 tablet; Refill: 0  2. Moderate episode of recurrent major depressive disorder ([place]) Restart on bupropion as below  Monitor for mood changes.  - buPROPion (WELLBUTRIN XL) 150 MG 24 hr tablet; Take 1 tablet (150 mg total) by mouth daily for 7 days, THEN 2 tablets (300 mg total) daily for 7 days, THEN 3 tablets (450 mg total) daily.  Dispense: 111 tablet; Refill: 0  Family/ staff Communication: Reviewed plan of care with patient verbalize understanding   Labs/tests ordered:  None   Next Appointment: As needed if symptoms worsen or fail  to improve    Sandrea Hughs, NP

## 2021-06-23 NOTE — Telephone Encounter (Signed)
Called to explain we update mediation list for our office, meaning GI related.  No answer left a voicemail to call back if she has further questions.

## 2021-07-01 ENCOUNTER — Ambulatory Visit (INDEPENDENT_AMBULATORY_CARE_PROVIDER_SITE_OTHER): Payer: Medicare Other | Admitting: Family

## 2021-07-01 ENCOUNTER — Encounter: Payer: Self-pay | Admitting: Family

## 2021-07-01 ENCOUNTER — Other Ambulatory Visit: Payer: Self-pay

## 2021-07-01 VITALS — BP 126/86 | HR 93 | Temp 97.7°F | Resp 16 | Ht 60.0 in

## 2021-07-01 DIAGNOSIS — F331 Major depressive disorder, recurrent, moderate: Secondary | ICD-10-CM

## 2021-07-01 DIAGNOSIS — J0100 Acute maxillary sinusitis, unspecified: Secondary | ICD-10-CM

## 2021-07-01 DIAGNOSIS — G43109 Migraine with aura, not intractable, without status migrainosus: Secondary | ICD-10-CM

## 2021-07-01 MED ORDER — ESCITALOPRAM OXALATE 20 MG PO TABS: 20.0000 mg | ORAL_TABLET | Freq: Every day | ORAL | 0 refills | Status: DC

## 2021-07-01 MED ORDER — CLINDAMYCIN HCL 150 MG PO CAPS: 150.0000 mg | ORAL_CAPSULE | Freq: Three times a day (TID) | ORAL | 0 refills | Status: AC

## 2021-07-01 NOTE — Progress Notes (Signed)
Provider: Marlowe Sax FNP-C  Anais Koenen, Nelda Bucks, NP  Patient Care Team: Malayzia Laforte, Nelda Bucks, NP as PCP - General (Family Medicine)  Extended Emergency Contact Information Primary Emergency Contact: [name] Mobile Phone: [phone] Relation: Son Secondary Emergency Contact: [name],[name],[name] Home Phone: [phone] Relation: Friend  Code Status:  Full Code  Goals of care: Advanced Directive information Advanced Directives 07/01/2021  Does Patient Have a Medical Advance Directive? No  Does patient want to make changes to medical advance directive? -  Would patient like information on creating a medical advance directive? No - Patient declined     Chief Complaint  Patient presents with   Follow-up    Patient states she wants to see PCP.    HPI:  Pt is a 70 y.o. female seen today for an acute visit for evaluation of depression.states has been taking Wellbutrin but states would like to get back on Lexapro.does not think Wellbutrin is working anymore. Had requested to restart Wellbutrin one month ago on last visit but states forgot that she was still taking Wellbutrin XL 450 mg Tablet. Not taking sertraline.Denies any SI thoughts.  Also complains of sinus congestion,pressure and drainage.completed antibiotic with some relief but symptoms recently recurred.denies any fever or chills.  She would like to see Neurologist for chronic migraine headache which seems to be worsening.States worms cloud be worsening her headaches. Still awaiting to be seen by Infectious disease at [facility] referred on previous visit for a second opinion.she was by ID here in [place] thought to have delusional parasitosis.  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  bilateral hips   Back fusion  1999   CERVICAL FUSION  [id number]   EYE SURGERY     eye muscle   KIDNEY SURGERY  1971   kidney reconstruction   LAMINECTOMY  1985   TOTAL HIP ARTHROPLASTY      Allergies  Allergen Reactions   Amoxicillin-Pot Clavulanate Nausea And Vomiting and Other (See Comments)   Clarithromycin Other (See Comments)    DOES NOT REMEMBER REACTION DOES NOT REMEMBER REACTION    Fentanyl Nausea And Vomiting and Other (See Comments)    FENTANYL PATCH FENTANYL PATCH FENTANYL PATCH    Levofloxacin Swelling    Face,eye swelling Face,eye swelling    Lorazepam Other (See Comments)    DO NOT PUT ANY ATIVAN ON PATIENT'S IV POST SURGERY! DO NOT PUT ANY ATIVAN ON PATIENT'S IV POST SURGERY!    Doxycycline Nausea And Vomiting   Penicillins Hives   Sulfa Antibiotics Other (See Comments)    Per pt: unknown    Outpatient Encounter Medications as of 07/01/2021  Medication Sig   azithromycin (ZITHROMAX) 250 MG tablet Take 2 tablets (500 mg ) by mouth x 1 dose then one  tablet ( 250 mg ) by mouth daily x 4 days   betamethasone valerate ointment (VALISONE) 0.1 % Apply 1 application topically 2 (two) times daily. Use for 2 weeks for a flare.  Apply twice a week at bedtime for maintenance dosing.   buPROPion (WELLBUTRIN XL) 150 MG 24 hr tablet Take 1 tablet (150 mg total) by mouth daily for 7 days, THEN 2 tablets (300 mg total) daily for 7 days, THEN 3 tablets (450 mg total) daily.   cyclobenzaprine (FLEXERIL) 5 MG tablet Take 5 mg by mouth in the morning, at noon, and at bedtime.   cyclobenzaprine (FLEXERIL) 5 MG tablet TAKE ONE TABLET EACH  DAY AND AS NEEDED FOR MUSCLE SPASM   Diclofenac Sodium CR 100 MG 24 hr tablet TAKE ONE TABLET DAILY WITH FOOD   HYDROcodone-acetaminophen (NORCO) 7.5-325 MG tablet Take 1 tablet by mouth 2 (two) times daily as needed.   methocarbamol (ROBAXIN) 500 MG tablet Take 1 tablet (500 mg total) by mouth as needed for muscle spasms. 1-2 tablets PRN   metoCLOPramide (REGLAN) 5 MG tablet Take 1 tablet (5 mg total) by mouth in the morning and at bedtime.   NP THYROID 60 MG tablet TAKE ONE TABLET EACH DAY BEFORE BREAKFAST   ondansetron (ZOFRAN) 4 MG tablet Take 1 tablet (4 mg total) by mouth every 8 (eight) hours as needed for nausea or vomiting.   promethazine (PHENERGAN) 25 MG suppository Place 1 suppository (25 mg total) rectally every 6 (six) hours as needed for nausea or vomiting.   RITALIN 5 MG tablet Take 1 tablet (5 mg total) by mouth daily as needed.   RITALIN LA 20 MG 24 hr capsule TAKE ONE CAPSULE EACH DAY   sertraline (ZOLOFT) 25 MG tablet Take 1 tablet (25 mg total) by mouth daily.   topiramate (TOPAMAX) 100 MG tablet Take 100 mg by mouth as needed.   No facility-administered encounter medications on file as of 07/01/2021.    Review of Systems  Constitutional:  Negative for appetite change, chills, fatigue, fever and unexpected weight change.  HENT:  Positive for congestion, sinus pressure and sinus pain. Negative for ear discharge, ear pain, facial swelling, hearing loss, nosebleeds, postnasal drip, rhinorrhea, sneezing, sore throat, tinnitus and trouble swallowing.   Eyes:  Negative for pain, discharge, redness, itching and visual disturbance.  Respiratory:  Negative for cough, chest tightness, shortness of breath and wheezing.   Cardiovascular:  Negative for chest pain, palpitations and leg swelling.  Musculoskeletal:  Positive for arthralgias, back pain and gait problem. Negative for joint swelling, myalgias, neck pain and neck stiffness.  Skin:  Negative for color change, pallor, rash and  wound.  Neurological:  Negative for dizziness, syncope, speech difficulty, weakness, light-headedness and numbness.       Migraine headache   Psychiatric/Behavioral:  Negative for agitation, behavioral problems, confusion, hallucinations, self-injury, sleep disturbance and suicidal ideas. The patient is not nervous/anxious.        Depressed    Immunization History  Administered Date(s) Administered   Influenza, High Dose Seasonal PF 06/09/2018, 09/07/2019   PFIZER(Purple Top)SARS-COV-2 Vaccination 11/19/2019, 02/12/2020   Pneumococcal Conjugate-13 08/22/2015   Pneumococcal Polysaccharide-23 11/10/2020   Pertinent  Health Maintenance Due  Topic Date Due   MAMMOGRAM  09/08/2013   INFLUENZA VACCINE  02/16/2021   COLONOSCOPY (Pts 45-32yrs Insurance coverage will need to be confirmed)  12/01/2021   DEXA SCAN  Completed   Fall Risk 04/29/2021 05/21/2021 06/17/2021 06/23/2021 07/01/2021  Falls in the past  year? - 0 - 0 0  Was there an injury with Fall? - 0 - 0 0  Fall Risk Category Calculator - 0 - 0 0  Fall Risk Category - Low - Low Low  Patient Fall Risk Level High fall risk Low fall risk High fall risk Low fall risk Low fall risk  Patient at Risk for Falls Due to - No Fall Risks - No Fall Risks No Fall Risks  Fall risk Follow up - Falls evaluation completed - Falls evaluation completed Falls evaluation completed   Functional Status Survey:    Vitals:   07/01/21 1541  BP: 126/86  Pulse: 93  Resp: 16  Temp: 97.7 F (36.5 C)  SpO2: 98%  Height: 5' (1.524 m)   Body mass index is 24.96 kg/m. Physical Exam Vitals reviewed.  Constitutional:      General: She is not in acute distress.    Appearance: Normal appearance. She is normal weight. She is not ill-appearing or diaphoretic.  HENT:     Head: Normocephalic.     Right Ear: Tympanic membrane, ear canal and external ear normal. There is no impacted cerumen.     Left Ear: Tympanic membrane, ear canal and external ear normal.  There is no impacted cerumen.     Nose: No congestion or rhinorrhea.     Right Sinus: Maxillary sinus tenderness present.     Left Sinus: Maxillary sinus tenderness present.     Mouth/Throat:     Mouth: Mucous membranes are moist.     Pharynx: Oropharynx is clear. No oropharyngeal exudate or posterior oropharyngeal erythema.  Eyes:     General: No scleral icterus.       Right eye: No discharge.        Left eye: No discharge.     Extraocular Movements: Extraocular movements intact.     Conjunctiva/sclera: Conjunctivae normal.     Pupils: Pupils are equal, round, and reactive to light.  Cardiovascular:     Rate and Rhythm: Normal rate and regular rhythm.     Pulses: Normal pulses.     Heart sounds: Normal heart sounds. No murmur heard.   No friction rub. No gallop.  Pulmonary:     Effort: Pulmonary effort is normal. No respiratory distress.     Breath sounds: Normal breath sounds. No wheezing, rhonchi or rales.  Chest:     Chest wall: No tenderness.  Abdominal:     General: Bowel sounds are normal. There is no distension.     Palpations: Abdomen is soft. There is no mass.     Tenderness: There is no abdominal tenderness. There is no right CVA tenderness, left CVA tenderness, guarding or rebound.  Musculoskeletal:     Cervical back: Normal range of motion. No rigidity or tenderness.  Lymphadenopathy:     Cervical: No cervical adenopathy.  Neurological:     Mental Status: She is alert and oriented to person, place, and time.     Cranial Nerves: No cranial nerve deficit.     Sensory: No sensory deficit.     Motor: No weakness.     Gait: Gait abnormal.  Psychiatric:        Mood and Affect: Mood is depressed.        Speech: Speech normal.        Behavior: Behavior normal.        Thought Content: Thought content is delusional.    Labs reviewed: Recent Labs    04/16/21 1415 04/29/21 1400  06/17/21 1400 06/17/21 1405  NA 139 144 139 143  K 4.4 4.3 4.1 4.1  CL 104 108 105  104  CO2 27 27 26   --   GLUCOSE 92 98 88 86  BUN 15 16 13 15   CREATININE 0.77 0.70 0.93 0.90  CALCIUM 9.6 9.9 9.5  --    Recent Labs    04/16/21 1415 04/29/21 1400 06/17/21 1400  AST 15 26 23   ALT 13 19 21   ALKPHOS  --  85 96  BILITOT 0.5 0.7 0.4  PROT 6.8 7.7 7.1  ALBUMIN  --  4.3 3.9   Recent Labs    04/16/21 1415 04/29/21 1400 06/17/21 1400 06/17/21 1405  WBC 6.5 4.9 9.3  --   NEUTROABS 4,680 3.5 7.1  --   HGB 14.4 15.0 14.1 14.6  HCT 43.2 44.8 43.3 43.0  MCV 89.1 89.1 90.0  --   PLT 308 289 317  --    Lab Results  Component Value Date   TSH 1.51 04/16/2021   No results found for: HGBA1C Lab Results  Component Value Date   CHOL 164 11/17/2020   HDL 78 11/17/2020   LDLCALC 72 11/17/2020   TRIG 49 11/17/2020   CHOLHDL 2.1 11/17/2020    Significant Diagnostic Results in last 30 days:  CT PELVIS WO CONTRAST  Result Date: 06/17/2021 CLINICAL DATA:  Pain, falls EXAM: CT PELVIS WITHOUT CONTRAST TECHNIQUE: Multidetector CT imaging of the pelvis was performed following the standard protocol without intravenous contrast. COMPARISON:  CT 06/28/2019. FINDINGS: Urinary Tract: There is some contrast material within the right lower pole the right kidney. Otherwise unremarkable. Bowel:  Unremarkable visualized pelvic bowel loops. Vascular/Lymphatic: Aortoiliac atherosclerotic calcifications. No lymphadenopathy. Reproductive:  Obscured by streak artifact. 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 Similar marked destructive changes of the left acetabulum. Extensive associated streak artifact and metallic or cement debris. Unchanged right total hip arthroplasty. No evidence of new complication. Prior L4-L5 and L5-S1 interbody fusion. No evidence of hardware complication. Bony fusion at L3-L4. There is severe bilateral facet arthropathy in the lower  lumbar spine. Chronic injury or degenerative change at the anterior right sacrum superiorly. 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 No new complication. Normal alignment of the right hip arthroplasty without evidence of right-sided complication. No evidence of acute pelvic fracture. Electronically Signed   By: 01/17/2021 M.D.   On: 06/17/2021 15:19   MR BRAIN WO CONTRAST  Result Date: 06/17/2021 CLINICAL DATA:  Acute neurologic deficit left-sided weakness with vision changes EXAM: MRI HEAD WITHOUT CONTRAST TECHNIQUE: Multiplanar, multiecho pulse sequences of the brain and surrounding structures were obtained without intravenous contrast. COMPARISON:  None. FINDINGS: Brain: No acute infarct, mass effect or extra-axial collection. No acute or chronic hemorrhage. There is multifocal hyperintense T2-weighted signal within the white matter. Generalized volume loss without a clear lobar predilection. The midline structures are normal. Vascular: Major flow voids are preserved. Skull and upper cervical spine: Advanced degenerative changes of C1-2. Sinuses/Orbits:Left maxillary and frontal sinus mucosal disease. Normal orbits. IMPRESSION: 1. No acute intracranial abnormality. 2. Findings of chronic microvascular ischemia and generalized volume loss. Electronically Signed   By: 14/04/2019 M.D.   On: 06/17/2021 23:03   DG Chest Portable 1 View  Result Date: 06/17/2021 CLINICAL DATA:  Fall EXAM: PORTABLE CHEST 1 VIEW COMPARISON:  Chest radiograph 04/22/2015, CTA chest 07/26/2018 FINDINGS: The cardiomediastinal silhouette is within normal limits There is no focal consolidation or pulmonary edema. There is no pleural effusion or pneumothorax. There is no acute osseous abnormality. No displaced rib fracture is seen. IMPRESSION: No radiographic evidence of acute cardiopulmonary process. Electronically Signed   By: Valetta Mole  M.D.   On: 06/17/2021 15:25   CT HEAD CODE STROKE WO CONTRAST  Result Date: 06/17/2021 CLINICAL DATA:  Code stroke.  Neuro deficit, acute, stroke suspected EXAM: CT HEAD WITHOUT CONTRAST TECHNIQUE: Contiguous axial images were obtained from the base of the skull through the vertex without intravenous contrast. COMPARISON:  None. FINDINGS: Brain: No acute intracranial hemorrhage, mass effect, or edema. Gray-white differentiation is preserved. Patchy low-density in the supratentorial white matter is nonspecific but may reflect minor chronic microvascular ischemic changes. Ventricles and sulci are normal in size and configuration. Vascular: No hyperdense vessel. Skull: Unremarkable. Sinuses/Orbits: Left frontal, anterior ethmoid, and partially imaged maxillary sinus mucosal thickening and opacification. Orbits are unremarkable. Other: Mastoid air cells are clear. ASPECTS (Stephenson Stroke Program Early CT Score) - Ganglionic level infarction (caudate, lentiform nuclei, internal capsule, insula, M1-M3 cortex): 7 - Supraganglionic infarction (M4-M6 cortex): 3 Total score (0-10 with 10 being normal): 10 IMPRESSION: There is no acute intracranial hemorrhage or evidence of acute infarction. ASPECT score is 10. These results were communicated to Dr. Cheral Marker at 2:06 pm on 06/17/2021 by text page via the [facility] messaging system. Electronically Signed   By: Macy Mis M.D.   On: 06/17/2021 14:10   CT ANGIO HEAD NECK W WO CM W PERF (CODE STROKE)  Result Date: 06/17/2021 CLINICAL DATA:  Acute neuro deficit.  Falls.  Left-sided weakness. EXAM: CT ANGIOGRAPHY HEAD AND NECK CT PERFUSION BRAIN TECHNIQUE: Multidetector CT imaging of the head and neck was performed using the standard protocol during bolus administration of intravenous contrast. Multiplanar CT image reconstructions and MIPs were obtained to evaluate the vascular anatomy. Carotid stenosis measurements (when applicable) are obtained utilizing NASCET criteria,  using the distal internal carotid diameter as the denominator. Multiphase CT imaging of the brain was performed following IV bolus contrast injection. Subsequent parametric perfusion maps were calculated using RAPID software. CONTRAST:  182mL OMNIPAQUE IOHEXOL 350 MG/ML SOLN COMPARISON:  CT head 06/17/2021 FINDINGS: CTA NECK FINDINGS Aortic arch: Normal aortic arch. Left carotid origin from the innominate artery. Right carotid system: Widely patent right carotid system without atherosclerotic disease or dissection Left carotid system: Widely patent left carotid system without atherosclerotic disease or dissection Vertebral arteries: Both vertebral arteries patent skull base without stenosis. Skeleton: 4 mm anterolisthesis C2-3. Marked degenerative changes C1-2 with bony overgrowth and mild spinal stenosis. Solid bony fusion C3 through C7. Anterior plate fusion [id number]. No acute skeletal finding. Other neck: Negative for mass or adenopathy Upper chest: Lung apices clear bilaterally. Review of the MIP images confirms the above findings CTA HEAD FINDINGS Anterior circulation: Cavernous carotid widely patent bilaterally. Anterior and middle cerebral arteries widely patent bilaterally. No stenosis or large vessel occlusion identified. Posterior circulation: Both vertebral arteries patent to the basilar. PICA patent bilaterally. Basilar widely patent. Posterior cerebral arteries widely patent. No large vessel occlusion Venous sinuses: Normal venous enhancement Anatomic variants: None Review of the MIP images confirms the above findings CT Brain Perfusion Findings: ASPECTS: 10 CBF (<30%) Volume: 21mL Perfusion (Tmax>6.0s) volume: 33mL Mismatch Volume: 18mL Infarction Location:None IMPRESSION: 1. CT perfusion negative for acute infarct 2. Negative for intracranial large vessel occlusion 3. No significant atherosclerotic  disease in the head or neck. 4. These results were called by telephone at the time of interpretation on  06/17/2021 at 3:02 pm to provider [facility] , who verbally acknowledged these results. Electronically Signed   By: Franchot Gallo M.D.   On: 06/17/2021 15:10   CT US GUIDE VASC ACCESS RT NO REPORT  Result Date: 06/17/2021 There is no Radiologist interpretation  for this exam.   Assessment/Plan  1. Moderate episode of recurrent major depressive disorder ([place]) - d/c sertraline then start on Lexapro  - will refer to psychiatry if still ineffective   - escitalopram (LEXAPRO) 20 MG tablet; Take 1 tablet (20 mg total) by mouth daily.  Dispense: 90 tablet; Refill: 0  2. Migraine with aura and without status migrainosus, not intractable Worsening symptoms. - Ambulatory referral to Neurology  3. Acute non-recurrent maxillary sinusitis Treated with Z-pak 06/23/2021 with some relief but symptoms recurred.declined to repeat Z-pak.she is allergic to doxycycline,PCN,sulfa,levofloxacin,clarithromycin and Augmentin.start on clindamycin as below. Refer to ENT if symptoms not relief.  - clindamycin (CLEOCIN) 150 MG capsule; Take 1 capsule (150 mg total) by mouth 3 (three) times daily for 10 days.  Dispense: 30 capsule; Refill: 0  Family/ staff Communication: Reviewed plan of care with patient verbalized understanding  Labs/tests ordered: None   Next Appointment: As needed if symptoms worsen or fail to improve    Sandrea Hughs, NP

## 2021-07-01 NOTE — Patient Instructions (Signed)
-   discontinue sertraline then start on Lexapro as requested

## 2021-07-25 ENCOUNTER — Other Ambulatory Visit: Payer: Self-pay | Admitting: Family

## 2021-07-25 DIAGNOSIS — E039 Hypothyroidism, unspecified: Secondary | ICD-10-CM

## 2021-07-27 NOTE — Telephone Encounter (Signed)
Patient has request refill on medication "Thyroid 60mg ". Patient medication has warnings. Medication pend and sent to PCP Ngetich, , NP for approval. Please Advise.

## 2021-07-30 ENCOUNTER — Ambulatory Visit: Payer: Medicare Other | Admitting: Family

## 2021-08-03 ENCOUNTER — Ambulatory Visit: Payer: Medicare Other | Admitting: Family

## 2021-08-04 ENCOUNTER — Ambulatory Visit (INDEPENDENT_AMBULATORY_CARE_PROVIDER_SITE_OTHER): Payer: Medicare Other | Admitting: Family

## 2021-08-04 ENCOUNTER — Encounter: Payer: Self-pay | Admitting: Family

## 2021-08-04 ENCOUNTER — Other Ambulatory Visit: Payer: Self-pay

## 2021-08-04 VITALS — BP 140/90 | HR 89 | Temp 97.8°F | Resp 16 | Ht 60.0 in | Wt 133.6 lb

## 2021-08-04 DIAGNOSIS — G43109 Migraine with aura, not intractable, without status migrainosus: Secondary | ICD-10-CM

## 2021-08-04 DIAGNOSIS — J320 Chronic maxillary sinusitis: Secondary | ICD-10-CM

## 2021-08-04 DIAGNOSIS — F909 Attention-deficit hyperactivity disorder, unspecified type: Secondary | ICD-10-CM | POA: Diagnosis not present

## 2021-08-04 MED ORDER — RITALIN 5 MG PO TABS: 5.0000 mg | ORAL_TABLET | Freq: Every day | ORAL | 0 refills | Status: DC | PRN

## 2021-08-04 MED ORDER — RITALIN LA 20 MG PO CP24: ORAL_CAPSULE | ORAL | 0 refills | Status: DC

## 2021-08-04 MED ORDER — TOPIRAMATE 100 MG PO TABS: 100.0000 mg | ORAL_TABLET | Freq: Every day | ORAL | 0 refills | Status: DC | PRN

## 2021-08-04 NOTE — Progress Notes (Signed)
Provider: Marlowe Sax FNP-C  Tayvien Kane, Nelda Bucks, NP  Patient Care Team: Buren Havey, Nelda Bucks, NP as PCP - General (Family Medicine)  Extended Emergency Contact Information Primary Emergency Contact: [name] Mobile Phone: [phone] Relation: Son Secondary Emergency Contact: [name],[name],[name] Home Phone: [phone] Relation: Friend  Code Status:  Full Code  Goals of care: Advanced Directive information Advanced Directives 08/04/2021  Does Patient Have a Medical Advance Directive? No  Does patient want to make changes to medical advance directive? -  Would patient like information on creating a medical advance directive? No - Patient declined     Chief Complaint  Patient presents with   Acute Visit    Patient complains of infectious diseases/sinuses.     HPI:  Pt is a 71 y.o. female seen today for an acute visit for referral to infectious disease and sinuses issues.states was told by infectious disease in [place] that she won't be seen since she has already had a visit with ID in [place].States still has multiple bumps and areas on the head related to worms under the skin.Brought in several pictures of different parts of the body - head ,trunk and extremities that has taken over a period of time concerning worm infestation.    She would like her Ritalin to be discontinued.States make her feel "gittery" can sleep.states was on Focalin 20 mg tablet twice daily in the past prescribed by Dr.Irving A.Lugo.she brought in script that she did not fill.  Request Topamax refill.  States previous sinus infection has not cleared up despite use of antibiotics x 2 would like to see a specialist for other alternative option.   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  bilateral hips   Back fusion  1999   CERVICAL FUSION  [id number]   EYE SURGERY     eye muscle   KIDNEY SURGERY  1971   kidney reconstruction   LAMINECTOMY  1985   TOTAL HIP ARTHROPLASTY      Allergies  Allergen Reactions   Amoxicillin-Pot Clavulanate Nausea And Vomiting and Other (See Comments)   Clarithromycin Other (See Comments)    DOES NOT REMEMBER REACTION DOES NOT REMEMBER REACTION    Fentanyl Nausea And Vomiting and Other (See Comments)    FENTANYL PATCH FENTANYL PATCH FENTANYL PATCH    Levofloxacin Swelling    Face,eye swelling Face,eye swelling    Lorazepam Other (See Comments)    DO NOT PUT ANY ATIVAN ON PATIENT'S IV POST SURGERY! DO NOT PUT ANY ATIVAN ON PATIENT'S IV POST SURGERY!    Doxycycline Nausea And Vomiting   Penicillins Hives   Sulfa Antibiotics Other (See Comments)    Per pt: unknown    Outpatient Encounter Medications as of 08/04/2021  Medication Sig   betamethasone valerate ointment (  VALISONE) 0.1 % Apply 1 application topically 2 (two) times daily. Use for 2 weeks for a flare.  Apply twice a week at bedtime for maintenance dosing.   buPROPion HCl ER, XL, 450 MG TB24 Take 1 tablet by mouth daily.   cyclobenzaprine (FLEXERIL) 5 MG tablet Take 5 mg by mouth in the morning, at noon, and at bedtime.   cyclobenzaprine (FLEXERIL) 5 MG tablet TAKE ONE TABLET EACH DAY AND AS NEEDED FOR MUSCLE SPASM   Diclofenac Sodium CR 100 MG 24 hr tablet TAKE ONE TABLET DAILY WITH FOOD   escitalopram (LEXAPRO) 20 MG tablet Take 1 tablet (20 mg total) by mouth daily.   HYDROcodone-acetaminophen (NORCO) 7.5-325  MG tablet Take 1 tablet by mouth 2 (two) times daily as needed.   methocarbamol (ROBAXIN) 500 MG tablet Take 1 tablet (500 mg total) by mouth as needed for muscle spasms. 1-2 tablets PRN   metoCLOPramide (REGLAN) 5 MG tablet Take 1 tablet (5 mg total) by mouth in the morning and at bedtime.   NP THYROID 60 MG tablet TAKE ONE TABLET EACH DAY BEFORE BREAKFAST   ondansetron (ZOFRAN) 4 MG tablet Take 1 tablet (4 mg total) by mouth every 8 (eight) hours as needed for nausea or vomiting.   promethazine (PHENERGAN) 25 MG suppository Place 1 suppository (25 mg total) rectally every 6 (six) hours as needed for nausea or vomiting.   RITALIN 5 MG tablet Take 1 tablet (5 mg total) by mouth daily as needed.   RITALIN LA 20 MG 24 hr capsule TAKE ONE CAPSULE EACH DAY   topiramate (TOPAMAX) 100 MG tablet Take 100 mg by mouth as needed.   [DISCONTINUED] buPROPion (WELLBUTRIN XL) 150 MG 24 hr tablet Take 1 tablet (150 mg total) by mouth daily for 7 days, THEN 2 tablets (300 mg total) daily for 7 days, THEN 3 tablets (450 mg total) daily.   No facility-administered encounter medications on file as of 08/04/2021.    Review of Systems  Constitutional:  Negative for appetite change, chills, fatigue, fever and unexpected weight change.  HENT:  Positive for congestion and sinus pressure. Negative for dental problem, ear discharge, ear pain, facial swelling, hearing loss, nosebleeds, postnasal drip, rhinorrhea, sinus pain, sneezing, sore throat, tinnitus and trouble swallowing.   Eyes:  Negative for pain, discharge, redness, itching and visual disturbance.  Respiratory:  Negative for cough, chest tightness, shortness of breath and wheezing.   Cardiovascular:  Negative for chest pain, palpitations and leg swelling.  Gastrointestinal:  Negative for abdominal distention, abdominal pain, blood in stool, constipation, diarrhea and vomiting.       Zofran effective for nausea   Genitourinary:  Negative for difficulty  urinating, dysuria, flank pain, frequency and urgency.  Musculoskeletal:  Positive for arthralgias and gait problem. Negative for back pain, joint swelling, myalgias, neck pain and neck stiffness.  Skin:  Negative for color change, pallor and rash.  Neurological:  Positive for headaches. Negative for dizziness, syncope, speech difficulty, weakness, light-headedness and numbness.       Request topamax for H/A   Psychiatric/Behavioral:  Negative for agitation, behavioral problems, confusion, hallucinations, self-injury, sleep disturbance and suicidal ideas. The patient is not nervous/anxious.    Immunization History  Administered Date(s) Administered   Influenza, High Dose Seasonal PF 06/09/2018, 09/07/2019   PFIZER(Purple Top)SARS-COV-2 Vaccination 11/19/2019, 02/12/2020   Pneumococcal Conjugate-13 08/22/2015   Pneumococcal Polysaccharide-23 11/10/2020   Pertinent  Health Maintenance Due  Topic Date Due   MAMMOGRAM  09/08/2013  INFLUENZA VACCINE  02/16/2021   COLONOSCOPY (Pts 45-43yrs Insurance coverage will need to be confirmed)  12/01/2021   DEXA SCAN  Completed   Fall Risk 05/21/2021 06/17/2021 06/23/2021 07/01/2021 08/04/2021  Falls in the past year? 0 - 0 0 0  Was there an injury with Fall? 0 - 0 0 0  Fall Risk Category Calculator 0 - 0 0 0  Fall Risk Category Low - Low Low Low  Patient Fall Risk Level Low fall risk High fall risk Low fall risk Low fall risk Low fall risk  Patient at Risk for Falls Due to No Fall Risks - No Fall Risks No Fall Risks No Fall Risks  Fall risk Follow up Falls evaluation completed - Falls evaluation completed Falls evaluation completed Falls evaluation completed   Functional Status Survey:    Vitals:   08/04/21 1616  BP: 140/90  Pulse: 89  Resp: 16  Temp: 97.8 F (36.6 C)  SpO2: 95%  Weight: 133 lb 9.6 oz (60.6 kg)  Height: 5' (1.524 m)   Body mass index is 26.09 kg/m. Physical Exam Vitals reviewed.  Constitutional:      General: She is  not in acute distress.    Appearance: Normal appearance. She is normal weight. She is not ill-appearing or diaphoretic.  HENT:     Head: Normocephalic.     Right Ear: Tympanic membrane, ear canal and external ear normal. There is no impacted cerumen.     Left Ear: Tympanic membrane, ear canal and external ear normal. There is no impacted cerumen.     Nose: Nose normal. No congestion or rhinorrhea.     Mouth/Throat:     Mouth: Mucous membranes are moist.     Pharynx: Oropharynx is clear. No oropharyngeal exudate or posterior oropharyngeal erythema.  Eyes:     General: No scleral icterus.       Right eye: No discharge.        Left eye: No discharge.     Extraocular Movements: Extraocular movements intact.     Conjunctiva/sclera: Conjunctivae normal.     Pupils: Pupils are equal, round, and reactive to light.  Neck:     Vascular: No carotid bruit.  Cardiovascular:     Rate and Rhythm: Normal rate and regular rhythm.     Pulses: Normal pulses.     Heart sounds: Normal heart sounds. No murmur heard.   No friction rub. No gallop.  Pulmonary:     Effort: Pulmonary effort is normal. No respiratory distress.     Breath sounds: Normal breath sounds. No wheezing, rhonchi or rales.  Chest:     Chest wall: No tenderness.  Abdominal:     General: Bowel sounds are normal. There is no distension.     Palpations: Abdomen is soft. There is no mass.     Tenderness: There is no abdominal tenderness. There is no right CVA tenderness, left CVA tenderness, guarding or rebound.  Musculoskeletal:        General: No swelling or tenderness.     Cervical back: Normal range of motion. No rigidity or tenderness.     Right lower leg: No edema.     Left lower leg: No edema.     Comments: Unsteady gait on wheelchair during visit   Lymphadenopathy:     Cervical: No cervical adenopathy.  Skin:    General: Skin is warm and dry.     Coloration: Skin is not pale.     Findings: No bruising, erythema,  lesion or  rash.  Neurological:     Mental Status: She is alert and oriented to person, place, and time.     Cranial Nerves: No cranial nerve deficit.     Sensory: No sensory deficit.     Motor: No weakness.     Coordination: Coordination normal.     Gait: Gait abnormal.  Psychiatric:        Mood and Affect: Mood normal.        Speech: Speech normal.        Behavior: Behavior normal.        Thought Content: Thought content is delusional.        Judgment: Judgment normal.    Labs reviewed: Recent Labs    04/16/21 1415 04/29/21 1400 06/17/21 1400 06/17/21 1405  NA 139 144 139 143  K 4.4 4.3 4.1 4.1  CL 104 108 105 104  CO2 27 27 26   --   GLUCOSE 92 98 88 86  BUN 15 16 13 15   CREATININE 0.77 0.70 0.93 0.90  CALCIUM 9.6 9.9 9.5  --    Recent Labs    04/16/21 1415 04/29/21 1400 06/17/21 1400  AST 15 26 23   ALT 13 19 21   ALKPHOS  --  85 96  BILITOT 0.5 0.7 0.4  PROT 6.8 7.7 7.1  ALBUMIN  --  4.3 3.9   Recent Labs    04/16/21 1415 04/29/21 1400 06/17/21 1400 06/17/21 1405  WBC 6.5 4.9 9.3  --   NEUTROABS 4,680 3.5 7.1  --   HGB 14.4 15.0 14.1 14.6  HCT 43.2 44.8 43.3 43.0  MCV 89.1 89.1 90.0  --   PLT 308 289 317  --    Lab Results  Component Value Date   TSH 1.51 04/16/2021   No results found for: HGBA1C Lab Results  Component Value Date   CHOL 164 11/17/2020   HDL 78 11/17/2020   LDLCALC 72 11/17/2020   TRIG 49 11/17/2020   CHOLHDL 2.1 11/17/2020    Significant Diagnostic Results in last 30 days:  No results found.  Assessment/Plan 1. Attention deficit hyperactivity disorder (ADHD), unspecified ADHD type Requested to switch Ritalin to Focalin but not covered by her insurance.  Continue on ritalin  - RITALIN LA 20 MG 24 hr capsule; TAKE ONE CAPSULE EACH DAY  Dispense: 30 capsule; Refill: 0 - RITALIN 5 MG tablet; Take 1 tablet (5 mg total) by mouth daily as needed.  Dispense: 30 tablet; Refill: 0 - Ambulatory referral to Psychiatry  2. Migraine with  aura and without status migrainosus, not intractable Refill Topamax  - topiramate (TOPAMAX) 100 MG tablet; Take 1 tablet (100 mg total) by mouth daily as needed.  Dispense: 30 tablet; Refill: 0  3. Chronic maxillary sinusitis Has completed antibiotics twice but continues to reports recurrence of symptoms.will refer to ENT - Ambulatory referral to ENT  Family/ staff Communication: Reviewed plan of care with patient verbalized understanding   Labs/tests ordered: None   Next Appointment: As needed if symptoms worsen or fail to improve    Sandrea Hughs, NP

## 2021-09-08 ENCOUNTER — Other Ambulatory Visit: Payer: Self-pay | Admitting: Orthopedic Surgery

## 2021-09-08 DIAGNOSIS — R110 Nausea: Secondary | ICD-10-CM

## 2021-09-11 ENCOUNTER — Telehealth: Payer: Self-pay | Admitting: *Deleted

## 2021-09-11 DIAGNOSIS — F909 Attention-deficit hyperactivity disorder, unspecified type: Secondary | ICD-10-CM

## 2021-09-11 MED ORDER — RITALIN 5 MG PO TABS: 5.0000 mg | ORAL_TABLET | Freq: Every day | ORAL | 0 refills | Status: DC | PRN

## 2021-09-11 MED ORDER — RITALIN LA 20 MG PO CP24: ORAL_CAPSULE | ORAL | 0 refills | Status: DC

## 2021-09-11 NOTE — Telephone Encounter (Signed)
1.) patient called requesting Medical Records. Stated that she is leaving Monday to go see a Dr. In [place] regarding her falls.  -I called patient and LMOM that she would need to sign Medical Release for her records.   2.)Patient requesting refill on her Ritalin.  Epic LR: 08/04/2021 Pended Rx and sent to [facility] for approval.

## 2021-09-11 NOTE — Telephone Encounter (Signed)
Noted  

## 2021-09-15 NOTE — Telephone Encounter (Signed)
[employer] called and stated that Ritalin 20mg  was APPROVED 09/15/2021-09/15/2022. Stated patient is aware.

## 2021-09-15 NOTE — Telephone Encounter (Signed)
Noted. Thank You.

## 2021-09-15 NOTE — Telephone Encounter (Signed)
Patient has PA faxed from Cover My Meds. PA is for medication Ritalin 20mg .  PA is currently pending  [facility] - Rx #: [facility]. Message routed to [facility] May/CMA, and [facility], NP as Richarda Blade.

## 2021-09-21 ENCOUNTER — Other Ambulatory Visit: Payer: Self-pay | Admitting: Family

## 2021-09-21 DIAGNOSIS — F331 Major depressive disorder, recurrent, moderate: Secondary | ICD-10-CM

## 2021-09-21 NOTE — Telephone Encounter (Signed)
Patient has request refill on medication "Escitalopram 20mg ". Patient medication last refilled 07/01/2021. Patient medication has High Risk Warnings. Medication pend and sent to PCP Ngetich, 07/03/2021, NP . ?

## 2021-09-28 ENCOUNTER — Other Ambulatory Visit: Payer: Self-pay | Admitting: Family

## 2021-09-28 NOTE — Telephone Encounter (Signed)
High risk or very high risk warning populated when attempting to refill medication. RX request sent to PCP for review and approval if warranted.   

## 2021-10-16 ENCOUNTER — Other Ambulatory Visit: Payer: Self-pay | Admitting: Family

## 2021-10-16 DIAGNOSIS — F909 Attention-deficit hyperactivity disorder, unspecified type: Secondary | ICD-10-CM

## 2021-10-31 ENCOUNTER — Other Ambulatory Visit: Payer: Self-pay | Admitting: Family

## 2021-11-09 ENCOUNTER — Other Ambulatory Visit: Payer: Self-pay | Admitting: Family

## 2021-11-24 ENCOUNTER — Other Ambulatory Visit: Payer: Self-pay | Admitting: Family

## 2021-11-24 DIAGNOSIS — G43109 Migraine with aura, not intractable, without status migrainosus: Secondary | ICD-10-CM

## 2021-11-25 ENCOUNTER — Other Ambulatory Visit: Payer: Self-pay | Admitting: Family

## 2021-11-25 DIAGNOSIS — F909 Attention-deficit hyperactivity disorder, unspecified type: Secondary | ICD-10-CM

## 2021-11-28 ENCOUNTER — Other Ambulatory Visit: Payer: Self-pay | Admitting: Family

## 2021-11-28 DIAGNOSIS — M545 Low back pain, unspecified: Secondary | ICD-10-CM

## 2021-11-28 DIAGNOSIS — G8929 Other chronic pain: Secondary | ICD-10-CM

## 2021-11-30 NOTE — Telephone Encounter (Signed)
High risk warning populated when attempting to approve refill request. Will send to Ngetich, Dinah C, NP to review.   

## 2021-12-11 ENCOUNTER — Encounter: Payer: Self-pay | Admitting: Internal Medicine

## 2021-12-29 ENCOUNTER — Other Ambulatory Visit: Payer: Self-pay | Admitting: Family

## 2021-12-29 DIAGNOSIS — F909 Attention-deficit hyperactivity disorder, unspecified type: Secondary | ICD-10-CM

## 2021-12-29 NOTE — Telephone Encounter (Signed)
Rx was requested however, patient does not have an upcoming appointment. Please advise.

## 2021-12-29 NOTE — Telephone Encounter (Signed)
Please have patient schedule follow up appointment prior to her next Ritalin refill in July,2023.

## 2021-12-29 NOTE — Telephone Encounter (Signed)
Left message on voicemail for patient to return call when available   

## 2021-12-30 NOTE — Telephone Encounter (Signed)
Patient called and scheduled an appointment for 01/25/2022.   Pended Rx and sent to [facility] for approval.

## 2022-01-01 ENCOUNTER — Other Ambulatory Visit: Payer: Self-pay | Admitting: Family

## 2022-01-01 DIAGNOSIS — R110 Nausea: Secondary | ICD-10-CM

## 2022-01-11 ENCOUNTER — Encounter: Payer: Self-pay | Admitting: Gastroenterology

## 2022-01-25 ENCOUNTER — Encounter: Payer: Medicare Other | Admitting: Family

## 2022-01-25 NOTE — Progress Notes (Signed)
  This encounter was created in error - please disregard. No show 

## 2022-02-04 ENCOUNTER — Other Ambulatory Visit: Payer: Self-pay | Admitting: Family

## 2022-02-04 DIAGNOSIS — E039 Hypothyroidism, unspecified: Secondary | ICD-10-CM

## 2022-03-11 ENCOUNTER — Other Ambulatory Visit: Payer: Self-pay | Admitting: Orthopaedic Surgery

## 2022-03-11 ENCOUNTER — Other Ambulatory Visit: Payer: Self-pay | Admitting: Pediatrics

## 2022-03-11 DIAGNOSIS — T84021D Dislocation of internal left hip prosthesis, subsequent encounter: Secondary | ICD-10-CM

## 2022-03-11 DIAGNOSIS — M25552 Pain in left hip: Secondary | ICD-10-CM

## 2022-03-12 ENCOUNTER — Other Ambulatory Visit: Payer: Self-pay | Admitting: Orthopaedic Surgery

## 2022-03-12 ENCOUNTER — Other Ambulatory Visit (HOSPITAL_COMMUNITY): Payer: Self-pay | Admitting: Orthopaedic Surgery

## 2022-03-12 DIAGNOSIS — T84021D Dislocation of internal left hip prosthesis, subsequent encounter: Secondary | ICD-10-CM

## 2022-03-12 DIAGNOSIS — M25552 Pain in left hip: Secondary | ICD-10-CM

## 2022-03-25 ENCOUNTER — Ambulatory Visit (HOSPITAL_COMMUNITY)
Admission: RE | Admit: 2022-03-25 | Discharge: 2022-03-25 | Disposition: A | Discharge status: Home / Self Care | Payer: Medicare Other | Source: Ambulatory Visit | Attending: Orthopaedic Surgery | Admitting: Orthopaedic Surgery

## 2022-03-25 DIAGNOSIS — T84021D Dislocation of internal left hip prosthesis, subsequent encounter: Secondary | ICD-10-CM | POA: Insufficient documentation

## 2022-03-25 LAB — POCT I-STAT CREATININE: Creatinine, Ser: 1 mg/dL (ref 0.44–1.00)

## 2022-03-25 MED ORDER — IOHEXOL 350 MG/ML SOLN
100.0000 mL | Freq: Once | INTRAVENOUS | Status: AC | PRN
  Administered 2022-03-25: 100 mL via INTRAVENOUS

## 2022-03-26 ENCOUNTER — Encounter: Payer: Self-pay | Admitting: Gastroenterology

## 2022-03-26 ENCOUNTER — Ambulatory Visit (INDEPENDENT_AMBULATORY_CARE_PROVIDER_SITE_OTHER): Payer: Medicare Other | Admitting: Gastroenterology

## 2022-03-26 VITALS — BP 104/74 | HR 76 | Ht 64.0 in | Wt 114.0 lb

## 2022-03-26 DIAGNOSIS — Z8719 Personal history of other diseases of the digestive system: Secondary | ICD-10-CM

## 2022-03-26 DIAGNOSIS — R1310 Dysphagia, unspecified: Secondary | ICD-10-CM | POA: Diagnosis not present

## 2022-03-26 NOTE — Progress Notes (Signed)
03/26/2022 [name] [id number] 10-May-1951   HISTORY OF PRESENT ILLNESS: This is a 71 year old female who is a patient of [name].  She has Ekbom's delusional parasitosis, history of failed left hip arthroplasty, depression who was seen by [name] in October 2022.  She was evaluated for nausea, vomiting, dysphagia.  Esophagram showed nonspecific motility disorder.  She was scheduled for an EGD and colonoscopy, but she had to cancel those and never rescheduled.  Now she is here with progressive dysphagia.  Saying that food and pills are getting stuck.  She says that she may be undergoing a huge surgery for her hip at [facility] and needs her swallowing status to be improved if possible before then.  She wants to hold off on the colonoscopy for now.  Is here with her son, [name], today.  Patient has a history of benign esophageal stricture and cricopharyngeal bar as well as a history of Zenker's diverticulum.  She underwent an EGD with dilation with improvement in her dysphagia in 2015. A barium swallow around that time showed nonspecific peristaltic abnormalities of the esophagus, as well as a prominent cricopharyngeal impression and a 1.9 cm Schatzki ring. She underwent a colonoscopy in 2013 which was notable for diverticulosis, otherwise unremarkable.  Past Medical History:  Diagnosis Date   ADHD    Anemia    Cellulitis 07/2018   left lower extremity   Chronic kidney disease    surgery to fix   Chronic pain    Depression    major depression   DVT (deep venous thrombosis) (HCC)    Ekbom's delusional parasitosis (HCC)    Fibromyalgia    History of Zenker's diverticulum removal    Lyme disease    Migraine with aura    Multiple falls    Thyroid disease    hypothyroid   Past Surgical History:  Procedure Laterality Date   APPENDECTOMY  1974   Avascular necrosis of the hip     multiple operations starting in 2005.  bilateral hips   Back fusion  1999   CERVICAL  FUSION  [id number]   EYE SURGERY     eye muscle   KIDNEY SURGERY  1971   kidney reconstruction   LAMINECTOMY  1985   TOTAL HIP ARTHROPLASTY      reports that she has never smoked. She has never used smokeless tobacco. She reports that she does not currently use alcohol. She reports that she does not use drugs. family history includes Cancer in her mother and sister; Hemochromatosis in her sister. Allergies  Allergen Reactions   Amoxicillin-Pot Clavulanate Nausea And Vomiting and Other (See Comments)   Clarithromycin Other (See Comments)    DOES NOT REMEMBER REACTION DOES NOT REMEMBER REACTION    Fentanyl Nausea And Vomiting and Other (See Comments)    FENTANYL PATCH FENTANYL PATCH FENTANYL PATCH    Levofloxacin Swelling    Face,eye swelling Face,eye swelling    Lorazepam Other (See Comments)    DO NOT PUT ANY ATIVAN ON PATIENT'S IV POST SURGERY! DO NOT PUT ANY ATIVAN ON PATIENT'S IV POST SURGERY!    Amoxicillin    Doxycycline Nausea And Vomiting   Penicillins Hives   Sulfa Antibiotics Other (See Comments)    Per pt: unknown      Outpatient Encounter Medications as of 03/26/2022  Medication Sig   betamethasone valerate ointment (VALISONE) 0.1 % Apply 1 application topically 2 (two) times daily. Use for 2 weeks for a flare.  Apply  twice a week at bedtime for maintenance dosing.   buPROPion HCl ER, XL, 450 MG TB24 TAKE ONE TABLET EACH DAY   cyclobenzaprine (FLEXERIL) 5 MG tablet Take 5 mg by mouth in the morning, at noon, and at bedtime.   cyclobenzaprine (FLEXERIL) 5 MG tablet TAKE ONE TABLET EACH DAY AND AS NEEDED FOR MUSCLE SPASM   cyclobenzaprine (FLEXERIL) 5 MG tablet TAKE ONE TABLET EACH DAY AND AS NEEDED FOR MUSCLE SPASM   Diclofenac Sodium CR 100 MG 24 hr tablet Take 1 tablet (100 mg total) by mouth daily. With Food, Appointment Due   escitalopram (LEXAPRO) 20 MG tablet TAKE ONE TABLET BY MOUTH ONCE DAILY   HYDROcodone-acetaminophen (NORCO) 7.5-325 MG tablet Take 1  tablet by mouth 2 (two) times daily as needed.   methocarbamol (ROBAXIN) 500 MG tablet TAKE 1 OR 2 TABLETS AS NEEDED FOR MUSCLE SPASM   metoCLOPramide (REGLAN) 5 MG tablet TAKE ONE TABLET IN THE MORNING AND AT BEDTIME   NP THYROID 60 MG tablet TAKE ONE TABLET EACH DAY BEFORE BREAKFAST   ondansetron (ZOFRAN) 4 MG tablet Take 1 tablet (4 mg total) by mouth every 8 (eight) hours as needed for nausea or vomiting.   promethazine (PHENERGAN) 25 MG suppository Place 1 suppository (25 mg total) rectally every 6 (six) hours as needed for nausea or vomiting.   RITALIN 5 MG tablet Take 1 tablet (5 mg total) by mouth daily as needed.   RITALIN LA 20 MG 24 hr capsule TAKE ONE CAPSULE EVERY DAY   topiramate (TOPAMAX) 100 MG tablet TAKE ONE TABLET EVERY MORNING   No facility-administered encounter medications on file as of 03/26/2022.     REVIEW OF SYSTEMS  : All other systems reviewed and negative except where noted in the History of Present Illness.   PHYSICAL EXAM: Ht 5\' 4"  (1.626 m)   Wt 114 lb (51.7 kg)   LMP 07/19/2000 (Approximate)   BMI 19.57 kg/m  General: Well developed white female in no acute distress Head: Normocephalic and atraumatic Eyes:  Sclerae anicteric, conjunctiva pink. Ears: Normal auditory acuity Lungs: Clear throughout to auscultation; no W/R/R. Heart: Regular rate and rhythm; no M/R/G. Abdomen: Soft, non-distended.  BS present.  Non-tender. Musculoskeletal: Symmetrical with no gross deformities  Skin: No lesions on visible extremities Extremities: No edema  Neurological: Alert oriented x 4, grossly non-focal Psychological:  Alert and cooperative. Normal mood and affect  ASSESSMENT AND PLAN: *Dysphagia and history of esophageal stricture that was balloon dilated in 2015: Now having dysphagia again to pills and solid foods that has been worsening.  We will schedule for EGD with dilation with Dr. 2016.  The risks, benefits, and alternatives to EGD and dilation were  discussed with the patient and she consents to proceed.  Of note, an esophagram last year showed nonspecific esophageal motility disorder, but she may benefit from empiric dilation of the esophagus.  **Of note, when she saw [name] last year they had scheduled both an EGD and a colonoscopy, but she had to cancel those procedures.  She does not want to reschedule colonoscopy for now, just wants to proceed with EGD first.   CC:  Ngetich, Tomasa Rand, NP

## 2022-03-26 NOTE — Progress Notes (Signed)
Agree with the assessment and plan as outlined by Jessica Zehr, PA-C.  Raney Koeppen E. Tiegan Jambor, MD  [facility]  

## 2022-03-26 NOTE — Patient Instructions (Addendum)
If you are age 73 or older, your body mass index should be between 23-30. Your Body mass index is 19.57 kg/m². If this is out of the aforementioned range listed, please consider follow up with your Primary Care Provider. ° °If you are age 64 or younger, your body mass index should be between 19-25. Your Body mass index is 19.57 kg/m². If this is out of the aformentioned range listed, please consider follow up with your Primary Care Provider.  ° °________________________________________________________ ° °The [facility] providers would like to encourage you to use MYCHART to communicate with providers for non-urgent requests or questions.  Due to long hold times on the telephone, sending your provider a message by MYCHART may be a faster and more efficient way to get a response.  Please allow 48 business hours for a response.  Please remember that this is for non-urgent requests.  °_______________________________________________________ ° °You have been scheduled for an endoscopy. Please follow written instructions given to you at your visit today. °If you use inhalers (even only as needed), please bring them with you on the day of your procedure. ° °Due to recent changes in healthcare laws, you may see the results of your imaging and laboratory studies on MyChart before your provider has had a chance to review them.  We understand that in some cases there may be results that are confusing or concerning to you. Not all laboratory results come back in the same time frame and the provider may be waiting for multiple results in order to interpret others.  Please give us 48 hours in order for your provider to thoroughly review all the results before contacting the office for clarification of your results.  ° °It was a pleasure to see you today! ° °Thank you for trusting me with your gastrointestinal care!   °  °

## 2022-03-27 ENCOUNTER — Encounter (HOSPITAL_COMMUNITY): Payer: Self-pay

## 2022-03-27 ENCOUNTER — Other Ambulatory Visit: Payer: Self-pay

## 2022-03-27 ENCOUNTER — Emergency Department (HOSPITAL_COMMUNITY)
Admission: EM | Admit: 2022-03-27 | Discharge: 2022-03-28 | Disposition: A | Discharge status: Home / Self Care | Payer: Medicare Other | Attending: Emergency Medicine | Admitting: Emergency Medicine

## 2022-03-27 DIAGNOSIS — R109 Unspecified abdominal pain: Secondary | ICD-10-CM

## 2022-03-27 DIAGNOSIS — R1084 Generalized abdominal pain: Secondary | ICD-10-CM | POA: Diagnosis not present

## 2022-03-27 DIAGNOSIS — R112 Nausea with vomiting, unspecified: Secondary | ICD-10-CM | POA: Insufficient documentation

## 2022-03-27 LAB — CBC WITH DIFFERENTIAL/PLATELET
Abs Immature Granulocytes: 0.02 10*3/uL (ref 0.00–0.07)
Basophils Absolute: 0.1 10*3/uL (ref 0.0–0.1)
Basophils Relative: 1 %
Eosinophils Absolute: 0.4 10*3/uL (ref 0.0–0.5)
Eosinophils Relative: 6 %
HCT: 41.5 % (ref 36.0–46.0)
Hemoglobin: 13.4 g/dL (ref 12.0–15.0)
Immature Granulocytes: 0 %
Lymphocytes Relative: 24 %
Lymphs Abs: 1.7 10*3/uL (ref 0.7–4.0)
MCH: 30 pg (ref 26.0–34.0)
MCHC: 32.3 g/dL (ref 30.0–36.0)
MCV: 93 fL (ref 80.0–100.0)
Monocytes Absolute: 0.7 10*3/uL (ref 0.1–1.0)
Monocytes Relative: 10 %
Neutro Abs: 4 10*3/uL (ref 1.7–7.7)
Neutrophils Relative %: 59 %
Platelets: 264 10*3/uL (ref 150–400)
RBC: 4.46 MIL/uL (ref 3.87–5.11)
RDW: 13.2 % (ref 11.5–15.5)
WBC: 6.9 10*3/uL (ref 4.0–10.5)
nRBC: 0 % (ref 0.0–0.2)

## 2022-03-27 MED ORDER — ONDANSETRON 4 MG PO TBDP
4.0000 mg | ORAL_TABLET | Freq: Once | ORAL | Status: AC | PRN
Start: 2022-03-27 — End: 2022-03-27
  Administered 2022-03-27: 4 mg via ORAL
  Filled 2022-03-27: qty 1

## 2022-03-27 MED ORDER — ONDANSETRON HCL 4 MG/2ML IJ SOLN
4.0000 mg | Freq: Once | INTRAMUSCULAR | Status: AC
  Administered 2022-03-27: 4 mg via INTRAVENOUS
  Filled 2022-03-27: qty 2

## 2022-03-27 MED ORDER — MORPHINE SULFATE (PF) 4 MG/ML IV SOLN
4.0000 mg | Freq: Once | INTRAVENOUS | Status: AC
  Administered 2022-03-27: 4 mg via INTRAVENOUS
  Filled 2022-03-27: qty 1

## 2022-03-27 NOTE — ED Provider Notes (Signed)
[facility]-EMERGENCY DEPT Provider Note   CSN: [id number] Arrival date & time: 03/27/22  2227     History {Add pertinent medical, surgical, social history, OB history to HPI:1} Chief Complaint  Patient presents with   Emesis    [name] is a 71 y.o. female.  The history is provided by the patient and medical records.  Emesis Associated symptoms: abdominal pain    71 y.o. F with hx of anemia, fibromyalgia, lyme disease, CKD, ADHD, depression, presenting to the ED for abdominal pain.  Patient states she felt fine all morning today, this evening she went to have a BM, strained a bit and since then has started having severe abdominal pain, nausea, vomiting.  She reports emesis x3 PTA, still feels nauseated here.  No diarrhea reported.  She reports she has "a lot of metal" in her abdomen that is due to be reconstructed next month in [place] and is concerned that she "popped something loose" and perforated her bowel.  She reports multiple prior abdominal surgeries.  Home Medications Prior to Admission medications   Medication Sig Start Date End Date Taking? Authorizing Provider  betamethasone valerate ointment (VALISONE) 0.1 % Apply 1 application topically 2 (two) times daily. Use for 2 weeks for a flare.  Apply twice a week at bedtime for maintenance dosing. 01/16/21   Patton Salles, MD  buPROPion HCl ER, XL, 450 MG TB24 TAKE ONE TABLET EACH DAY 11/10/21   Ngetich, Dinah C, NP  cyclobenzaprine (FLEXERIL) 5 MG tablet Take 5 mg by mouth in the morning, at noon, and at bedtime.    [provider]  cyclobenzaprine (FLEXERIL) 5 MG tablet TAKE ONE TABLET EACH DAY AND AS NEEDED FOR MUSCLE SPASM 05/20/21   Ngetich, Dinah C, NP  cyclobenzaprine (FLEXERIL) 5 MG tablet TAKE ONE TABLET EACH DAY AND AS NEEDED FOR MUSCLE SPASM 11/02/21   Ngetich, Dinah C, NP  Diclofenac Sodium CR 100 MG 24 hr tablet Take 1 tablet (100 mg total) by mouth daily. With Food,  Appointment Due 09/28/21   Ngetich, Dinah C, NP  escitalopram (LEXAPRO) 20 MG tablet TAKE ONE TABLET BY MOUTH ONCE DAILY 09/21/21   Ngetich, Dinah C, NP  HYDROcodone-acetaminophen (NORCO) 7.5-325 MG tablet Take 1 tablet by mouth 2 (two) times daily as needed. 05/25/21   [provider]  methocarbamol (ROBAXIN) 500 MG tablet TAKE 1 OR 2 TABLETS AS NEEDED FOR MUSCLE SPASM 11/30/21   Ngetich, Dinah C, NP  metoCLOPramide (REGLAN) 5 MG tablet TAKE ONE TABLET IN THE MORNING AND AT BEDTIME 01/01/22   Ngetich, Dinah C, NP  NP THYROID 60 MG tablet TAKE ONE TABLET EACH DAY BEFORE BREAKFAST 02/04/22   Ngetich, Dinah C, NP  ondansetron (ZOFRAN) 4 MG tablet Take 1 tablet (4 mg total) by mouth every 8 (eight) hours as needed for nausea or vomiting. 05/21/21   Ngetich, Dinah C, NP  promethazine (PHENERGAN) 25 MG suppository Place 1 suppository (25 mg total) rectally every 6 (six) hours as needed for nausea or vomiting. 05/21/21   Ngetich, Dinah C, NP  RITALIN 5 MG tablet Take 1 tablet (5 mg total) by mouth daily as needed. 09/11/21   Ngetich, Dinah C, NP  RITALIN LA 20 MG 24 hr capsule TAKE ONE CAPSULE EVERY DAY 12/30/21   Ngetich, Dinah C, NP  topiramate (TOPAMAX) 100 MG tablet TAKE ONE TABLET EVERY MORNING 11/24/21   Ngetich, Donalee Citrin, NP      Allergies    Amoxicillin-pot  clavulanate, Clarithromycin, Fentanyl, Levofloxacin, Lorazepam, Amoxicillin, Doxycycline, Penicillins, and Sulfa antibiotics    Review of Systems   Review of Systems  Gastrointestinal:  Positive for abdominal pain, nausea and vomiting.  All other systems reviewed and are negative.   Physical Exam Updated Vital Signs BP (!) 161/86 (BP Location: Left Arm)   Pulse 65   Temp 98.4 F (36.9 C) (Oral)   Resp 20   Ht 5\' 4"  (1.626 m)   Wt 56.2 kg   LMP 07/19/2000 (Approximate)   SpO2 96%   BMI 21.28 kg/m   Physical Exam Vitals and nursing note reviewed.  Constitutional:      Appearance: She is well-developed.     Comments: Moaning,  writhing in bed, appears uncomfortable  HENT:     Head: Normocephalic and atraumatic.  Eyes:     Conjunctiva/sclera: Conjunctivae normal.     Pupils: Pupils are equal, round, and reactive to light.  Cardiovascular:     Rate and Rhythm: Normal rate and regular rhythm.     Heart sounds: Normal heart sounds.  Pulmonary:     Effort: Pulmonary effort is normal.     Breath sounds: Normal breath sounds.  Abdominal:     General: Bowel sounds are normal.     Palpations: Abdomen is soft.     Tenderness: There is generalized abdominal tenderness.  Musculoskeletal:        General: Normal range of motion.     Cervical back: Normal range of motion.  Skin:    General: Skin is warm and dry.  Neurological:     Mental Status: She is alert and oriented to person, place, and time.     ED Results / Procedures / Treatments   Labs (all labs ordered are listed, but only abnormal results are displayed) Labs Reviewed  LIPASE, BLOOD  COMPREHENSIVE METABOLIC PANEL  URINALYSIS, ROUTINE W REFLEX MICROSCOPIC  CBC WITH DIFFERENTIAL/PLATELET    EKG None  Radiology No results found.  Procedures Procedures  {Document cardiac monitor, telemetry assessment procedure when appropriate:1}  Medications Ordered in ED Medications  morphine (PF) 4 MG/ML injection 4 mg (has no administration in time range)  ondansetron (ZOFRAN) injection 4 mg (has no administration in time range)  ondansetron (ZOFRAN-ODT) disintegrating tablet 4 mg (4 mg Oral Given 03/27/22 2243)    ED Course/ Medical Decision Making/ A&P                           Medical Decision Making Amount and/or Complexity of Data Reviewed Labs: ordered. Radiology: ordered.  Risk Prescription drug management.   ***  {Document critical care time when appropriate:1} {Document review of labs and clinical decision tools ie heart score, Chads2Vasc2 etc:1}  {Document your independent review of radiology images, and any outside  records:1} {Document your discussion with family members, caretakers, and with consultants:1} {Document social determinants of health affecting pt's care:1} {Document your decision making why or why not admission, treatments were needed:1} Final Clinical Impression(s) / ED Diagnoses Final diagnoses:  None    Rx / DC Orders ED Discharge Orders     None

## 2022-03-27 NOTE — ED Triage Notes (Signed)
Pt presents with abd pain and emesis that started today after having a hard BM today.

## 2022-03-28 ENCOUNTER — Encounter (HOSPITAL_COMMUNITY): Payer: Self-pay

## 2022-03-28 ENCOUNTER — Emergency Department (HOSPITAL_COMMUNITY): Payer: Medicare Other

## 2022-03-28 DIAGNOSIS — R1084 Generalized abdominal pain: Secondary | ICD-10-CM | POA: Diagnosis not present

## 2022-03-28 LAB — URINALYSIS, ROUTINE W REFLEX MICROSCOPIC
Bilirubin Urine: NEGATIVE
Glucose, UA: NEGATIVE mg/dL
Hgb urine dipstick: NEGATIVE
Ketones, ur: NEGATIVE mg/dL
Leukocytes,Ua: NEGATIVE
Nitrite: NEGATIVE
Protein, ur: NEGATIVE mg/dL
Specific Gravity, Urine: >1.046 — ABNORMAL HIGH (ref 1.005–1.030)
pH: 7 (ref 5.0–8.0)

## 2022-03-28 LAB — COMPREHENSIVE METABOLIC PANEL
ALT: 17 U/L (ref 0–44)
AST: 20 U/L (ref 15–41)
Albumin: 4.2 g/dL (ref 3.5–5.0)
Alkaline Phosphatase: 86 U/L (ref 38–126)
Anion gap: 5 (ref 5–15)
BUN: 22 mg/dL (ref 8–23)
CO2: 23 mmol/L (ref 22–32)
Calcium: 9.3 mg/dL (ref 8.9–10.3)
Chloride: 113 mmol/L — ABNORMAL HIGH (ref 98–111)
Creatinine, Ser: 1.15 mg/dL — ABNORMAL HIGH (ref 0.44–1.00)
GFR, Estimated: 50 mL/min — ABNORMAL LOW (ref >60)
Glucose, Bld: 122 mg/dL — ABNORMAL HIGH (ref 70–99)
Potassium: 4.1 mmol/L (ref 3.5–5.1)
Sodium: 141 mmol/L (ref 135–145)
Total Bilirubin: 0.7 mg/dL (ref 0.3–1.2)
Total Protein: 7.3 g/dL (ref 6.5–8.1)

## 2022-03-28 LAB — LIPASE, BLOOD: Lipase: 35 U/L (ref 11–51)

## 2022-03-28 MED ORDER — HYDROMORPHONE HCL 1 MG/ML IJ SOLN
1.0000 mg | Freq: Once | INTRAMUSCULAR | Status: AC
  Administered 2022-03-28: 1 mg via INTRAVENOUS
  Filled 2022-03-28: qty 1

## 2022-03-28 MED ORDER — ONDANSETRON 4 MG PO TBDP: 4.0000 mg | ORAL_TABLET | Freq: Three times a day (TID) | ORAL | 0 refills | Status: DC | PRN

## 2022-03-28 MED ORDER — IOHEXOL 300 MG/ML  SOLN
100.0000 mL | Freq: Once | INTRAMUSCULAR | Status: AC | PRN
  Administered 2022-03-28: 100 mL via INTRAVENOUS

## 2022-03-28 NOTE — Discharge Instructions (Signed)
Labs today were reassuring.  CT without acute/surgical findings, did appear to have some inflammation around your left kidney but no infection or stone. We do recommend that you follow-up with urology as well as your primary care doctor. Return here for new concerns.

## 2022-03-29 ENCOUNTER — Encounter: Payer: Self-pay | Admitting: Gastroenterology

## 2022-03-29 ENCOUNTER — Ambulatory Visit (AMBULATORY_SURGERY_CENTER): Payer: Medicare Other | Admitting: Gastroenterology

## 2022-03-29 VITALS — BP 110/63 | HR 63 | Temp 97.5°F | Resp 14 | Ht 64.0 in | Wt 126.2 lb

## 2022-03-29 DIAGNOSIS — K2950 Unspecified chronic gastritis without bleeding: Secondary | ICD-10-CM | POA: Diagnosis not present

## 2022-03-29 DIAGNOSIS — K2270 Barrett's esophagus without dysplasia: Secondary | ICD-10-CM | POA: Diagnosis not present

## 2022-03-29 DIAGNOSIS — K250 Acute gastric ulcer with hemorrhage: Secondary | ICD-10-CM | POA: Diagnosis not present

## 2022-03-29 DIAGNOSIS — R1310 Dysphagia, unspecified: Secondary | ICD-10-CM

## 2022-03-29 DIAGNOSIS — K2980 Duodenitis without bleeding: Secondary | ICD-10-CM | POA: Diagnosis not present

## 2022-03-29 DIAGNOSIS — K222 Esophageal obstruction: Secondary | ICD-10-CM | POA: Diagnosis not present

## 2022-03-29 MED ORDER — SUCRALFATE 1 G PO TABS: 1.0000 g | ORAL_TABLET | Freq: Four times a day (QID) | ORAL | 0 refills | Status: DC

## 2022-03-29 MED ORDER — SODIUM CHLORIDE 0.9 % IV SOLN: 500.0000 mL | Freq: Once | INTRAVENOUS | Status: DC

## 2022-03-29 MED ORDER — OMEPRAZOLE 40 MG PO CPDR: 40.0000 mg | DELAYED_RELEASE_CAPSULE | Freq: Every day | ORAL | 1 refills | Status: DC

## 2022-03-29 NOTE — Progress Notes (Signed)
History and Physical Interval Note:  03/29/2022 1:28 PM  [name]  has presented today for endoscopic procedure(s), with the diagnosis of  Encounter Diagnosis  Name Primary?   Dysphagia, unspecified type Yes  .  The various methods of evaluation and treatment have been discussed with the patient and/or family. After consideration of risks, benefits and other options for treatment, the patient has consented to  the endoscopic procedure(s).   The patient's history has been reviewed, patient examined, no change in status, stable for endoscopic procedure(s).  I have reviewed the patient's chart and labs.  Questions were answered to the patient's satisfaction.     [name] E. Tomasa Rand, MD [facility]

## 2022-03-29 NOTE — Progress Notes (Signed)
Called to room to assist during endoscopic procedure.  Patient ID and intended procedure confirmed with present staff. Received instructions for my participation in the procedure from the performing physician.  

## 2022-03-29 NOTE — Patient Instructions (Signed)
Discharge instructions given. Handout on a Dilatation diet. Office will call to schedule repeat endoscopy for 8 weeks. YOU HAD AN ENDOSCOPIC PROCEDURE TODAY AT THE [facility]:   Refer to the procedure report that was given to you for any specific questions about what was found during the examination.  If the procedure report does not answer your questions, please call your gastroenterologist to clarify.  If you requested that your care partner not be given the details of your procedure findings, then the procedure report has been included in a sealed envelope for you to review at your convenience later.  YOU SHOULD EXPECT: Some feelings of bloating in the abdomen. Passage of more gas than usual.  Walking can help get rid of the air that was put into your GI tract during the procedure and reduce the bloating. If you had a lower endoscopy (such as a colonoscopy or flexible sigmoidoscopy) you may notice spotting of blood in your stool or on the toilet paper. If you underwent a bowel prep for your procedure, you may not have a normal bowel movement for a few days.  Please Note:  You might notice some irritation and congestion in your nose or some drainage.  This is from the oxygen used during your procedure.  There is no need for concern and it should clear up in a day or so.  SYMPTOMS TO REPORT IMMEDIATELY:   Following upper endoscopy (EGD)  Vomiting of blood or coffee ground material  New chest pain or pain under the shoulder blades  Painful or persistently difficult swallowing  New shortness of breath  Fever of 100F or higher  Black, tarry-looking stools  For urgent or emergent issues, a gastroenterologist can be reached at any hour by calling [phone]. Do not use MyChart messaging for urgent concerns.    DIET:  We do recommend a small meal at first, but then you may proceed to your regular diet.  Drink plenty of fluids but you should avoid alcoholic beverages for 24  hours.  ACTIVITY:  You should plan to take it easy for the rest of today and you should NOT DRIVE or use heavy machinery until tomorrow (because of the sedation medicines used during the test).    FOLLOW UP: Our staff will call the number listed on your records the next business day following your procedure.  We will call around 7:15- 8:00 am to check on you and address any questions or concerns that you may have regarding the information given to you following your procedure. If we do not reach you, we will leave a message.     If any biopsies were taken you will be contacted by phone or by letter within the next 1-3 weeks.  Please call us at [phone] if you have not heard about the biopsies in 3 weeks.    SIGNATURES/CONFIDENTIALITY: You and/or your care partner have signed paperwork which will be entered into your electronic medical record.  These signatures attest to the fact that that the information above on your After Visit Summary has been reviewed and is understood.  Full responsibility of the confidentiality of this discharge information lies with you and/or your care-partner.

## 2022-03-29 NOTE — Op Note (Signed)
[place] Patient Name: [name] Procedure Date: 03/29/2022 1:28 PM MRN: [id number] Endoscopist: Nicki Reaper E. Candis Schatz , MD Age: 71 Referring MD:  Date of Birth: 1951/05/10 Gender: Female Account #: [id number] Procedure:                Upper GI endoscopy Indications:              Generalized abdominal pain, Dysphagia Medicines:                Monitored Anesthesia Care Procedure:                Pre-Anesthesia Assessment:                           - Prior to the procedure, a History and Physical                            was performed, and patient medications and                            allergies were reviewed. The patient's tolerance of                            previous anesthesia was also reviewed. The risks                            and benefits of the procedure and the sedation                            options and risks were discussed with the patient.                            All questions were answered, and informed consent                            was obtained. Prior Anticoagulants: The patient has                            taken no previous anticoagulant or antiplatelet                            agents. ASA Grade Assessment: III - A patient with                            severe systemic disease. After reviewing the risks                            and benefits, the patient was deemed in                            satisfactory condition to undergo the procedure.                           After obtaining informed consent, the endoscope was  passed under direct vision. Throughout the                            procedure, the patient's blood pressure, pulse, and                            oxygen saturations were monitored continuously. The                            Endoscope was introduced through the mouth, and                            advanced to the third part of duodenum. The upper                            GI endoscopy  was accomplished without difficulty.                            The patient tolerated the procedure well. Scope In: Scope Out: Findings:                 The examined portions of the nasopharynx,                            oropharynx and larynx were normal.                           The lower third of the esophagus was significantly                            tortuous.                           There were esophageal mucosal changes suspicious                            for short-segment Barrett's esophagus present at                            the gastroesophageal junction. The maximum                            longitudinal extent of these mucosal changes was 1                            cm in length. Mucosa was biopsied with a cold                            forceps for histology in 4 quadrants at 30 cm from                            the incisors. Estimated blood loss was minimal.                           The exam of the  esophagus was otherwise normal.                           One benign-appearing, intrinsic mild stenosis was                            found 31 cm from the incisors. This stenosis                            measured 1.3 cm (inner diameter) x 1 cm (in                            length). The stenosis was traversed. A TTS dilator                            was passed through the scope. Dilation with a                            16-17-18 mm balloon dilator was performed to 17 mm.                            The dilation site was examined and showed mild                            mucosal disruption. Estimated blood loss was                            minimal.                           A 5 cm hiatal hernia was present.                           Hematin (altered blood/coffee-ground-like material)                            was found in the gastric body and in the gastric                            antrum.                           Many non-bleeding superficial gastric ulcers  with                            adherent clot were found in the gastric body, in                            the gastric antrum and in the prepyloric region of                            the stomach. Biopsies were taken with a cold  forceps for Helicobacter pylori testing. Estimated                            blood loss was minimal.                           The exam of the stomach was otherwise normal.                           Diffuse mildly erythematous mucosa without active                            bleeding and with no stigmata of bleeding was found                            in the duodenal bulb. Biopsies were taken with a                            cold forceps for histology. Estimated blood loss                            was minimal.                           The exam of the duodenum was otherwise normal. Complications:            No immediate complications. Estimated Blood Loss:     Estimated blood loss was minimal. Impression:               - The examined portions of the nasopharynx,                            oropharynx and larynx were normal.                           - Tortuous esophagus.                           - Esophageal mucosal changes suspicious for                            short-segment Barrett's esophagus. Biopsied.                           - Benign-appearing esophageal stenosis. Dilated.                           - 5 cm hiatal hernia.                           - Hematin (altered blood/coffee-ground-like                            material) in the gastric antrum and in the gastric  body.                           - Non-bleeding gastric ulcers with adherent clot.                            Biopsied.                           - Erythematous duodenopathy. Biopsied. Recommendation:           - Patient has a contact number available for                            emergencies. The signs and symptoms of potential                             delayed complications were discussed with the                            patient. Return to normal activities tomorrow.                            Written discharge instructions were provided to the                            patient.                           - Resume previous diet.                           - Continue present medications.                           - Await pathology results.                           - Repeat upper endoscopy in 8 weeks to check                            healing.                           - Use Prilosec (omeprazole) 40 mg PO daily for 8                            weeks.                           - Use sucralfate tablets 1 gram PO QID for 2 weeks.                           - Avoid NSAIDs Kayna Suppa E. Tomasa Rand, MD 03/29/2022 2:10:09 PM This report has been signed electronically.

## 2022-03-29 NOTE — Progress Notes (Signed)
Report to PACU, RN, vss, BBS= Clear.  

## 2022-03-30 ENCOUNTER — Telehealth: Payer: Self-pay | Admitting: Gastroenterology

## 2022-03-30 ENCOUNTER — Telehealth: Payer: Self-pay

## 2022-03-30 NOTE — Telephone Encounter (Signed)
Inbound call from patient states she is unable to take the rx sucralfate. Her pharmacy told her it can be taken in a liquid form. Please advice.

## 2022-03-30 NOTE — Telephone Encounter (Signed)
  Follow up Call-     03/29/2022    1:00 PM  Call back number  Post procedure Call Back phone  # [phone]  Permission to leave phone message Yes    Follow up call, LVM.

## 2022-03-31 ENCOUNTER — Other Ambulatory Visit: Payer: Self-pay

## 2022-03-31 MED ORDER — SUCRALFATE 1 GM/10ML PO SUSP
1.0000 g | Freq: Four times a day (QID) | ORAL | 1 refills | Status: DC
Start: 2022-03-31 — End: 2022-11-15

## 2022-03-31 NOTE — Telephone Encounter (Signed)
Patient is calling to follow up on medication "Carafate". Please call her back

## 2022-03-31 NOTE — Telephone Encounter (Signed)
Left VM letting patient know to take Carafate suspension 4 times daily before meals for 14 days

## 2022-04-01 ENCOUNTER — Encounter: Payer: Self-pay | Admitting: Gastroenterology

## 2022-04-01 NOTE — Telephone Encounter (Signed)
Left VM for patient to return call

## 2022-04-23 ENCOUNTER — Telehealth: Payer: Self-pay | Admitting: Gastroenterology

## 2022-04-23 ENCOUNTER — Other Ambulatory Visit: Payer: Self-pay | Admitting: Family

## 2022-04-23 DIAGNOSIS — F331 Major depressive disorder, recurrent, moderate: Secondary | ICD-10-CM

## 2022-04-23 NOTE — Telephone Encounter (Signed)
Inbound call from patient states when she was taking omeprazole it gave her hives. She has since stop taking it and states she is not experiencing any GERD symptoms. Please advise. She was also requesting results from her EGD if they were available. Thank you

## 2022-04-23 NOTE — Telephone Encounter (Signed)
Left message for pt to call back  °

## 2022-04-27 NOTE — Telephone Encounter (Signed)
Pt did not return call, will await further communication from pt. 

## 2022-05-11 ENCOUNTER — Other Ambulatory Visit: Payer: Self-pay | Admitting: Family

## 2022-05-11 DIAGNOSIS — G8929 Other chronic pain: Secondary | ICD-10-CM

## 2022-05-14 NOTE — Telephone Encounter (Signed)
Pt called back and states she cannot take the omeprazole, states it gave her diarrhea. Pt wants to know if there is something else she can take. Please advise.

## 2022-05-17 ENCOUNTER — Other Ambulatory Visit: Payer: Self-pay

## 2022-05-17 MED ORDER — PANTOPRAZOLE SODIUM 40 MG PO TBEC: 40.0000 mg | DELAYED_RELEASE_TABLET | Freq: Two times a day (BID) | ORAL | 0 refills | Status: DC

## 2022-05-17 NOTE — Telephone Encounter (Signed)
Detailed message left on pts voicemail. Script sent to pharmacy.

## 2022-06-09 ENCOUNTER — Encounter: Payer: Self-pay | Admitting: Gastroenterology

## 2022-06-11 ENCOUNTER — Other Ambulatory Visit: Payer: Self-pay | Admitting: Family

## 2022-06-14 NOTE — Telephone Encounter (Signed)
Patient is requesting a refill of the following medications: Requested Prescriptions   Pending Prescriptions Disp Refills   buPROPion HCl ER, XL, 450 MG TB24 [Pharmacy Med Name: bupropion HCl XL 450 mg 24 hr tablet, extended release] 30 tablet 5    Sig: TAKE ONE TABLET BY MOUTH EVERY DAY    Date of last refill:11/10/2021  Refill amount:  30 tablets 5 refills Treatment agreement date: last treatment agreement date was 03/05/2021. Patient needs it updated

## 2022-06-18 HISTORY — PX: HIP SURGERY: SHX245

## 2022-07-27 ENCOUNTER — Ambulatory Visit (HOSPITAL_BASED_OUTPATIENT_CLINIC_OR_DEPARTMENT_OTHER): Payer: Medicare Other

## 2022-07-27 ENCOUNTER — Other Ambulatory Visit (HOSPITAL_COMMUNITY): Payer: Self-pay | Admitting: Orthopaedic Surgery

## 2022-07-27 DIAGNOSIS — R609 Edema, unspecified: Secondary | ICD-10-CM

## 2022-07-28 ENCOUNTER — Ambulatory Visit (HOSPITAL_BASED_OUTPATIENT_CLINIC_OR_DEPARTMENT_OTHER)
Admission: RE | Admit: 2022-07-28 | Discharge: 2022-07-28 | Disposition: A | Discharge status: Home / Self Care | Payer: Medicare Other | Source: Ambulatory Visit | Attending: Orthopaedic Surgery | Admitting: Orthopaedic Surgery

## 2022-07-28 DIAGNOSIS — R609 Edema, unspecified: Secondary | ICD-10-CM | POA: Diagnosis not present

## 2022-08-29 ENCOUNTER — Encounter (HOSPITAL_COMMUNITY): Payer: Self-pay | Admitting: Emergency Medicine

## 2022-08-29 ENCOUNTER — Emergency Department (HOSPITAL_COMMUNITY): Payer: Medicare Other

## 2022-08-29 ENCOUNTER — Emergency Department (HOSPITAL_COMMUNITY)
Admission: EM | Admit: 2022-08-29 | Discharge: 2022-08-30 | Disposition: A | Discharge status: Home / Self Care | Payer: Medicare Other | Attending: Emergency Medicine | Admitting: Emergency Medicine

## 2022-08-29 DIAGNOSIS — N189 Chronic kidney disease, unspecified: Secondary | ICD-10-CM | POA: Insufficient documentation

## 2022-08-29 DIAGNOSIS — Z79899 Other long term (current) drug therapy: Secondary | ICD-10-CM | POA: Insufficient documentation

## 2022-08-29 DIAGNOSIS — R251 Tremor, unspecified: Secondary | ICD-10-CM

## 2022-08-29 DIAGNOSIS — R531 Weakness: Secondary | ICD-10-CM | POA: Insufficient documentation

## 2022-08-29 LAB — CBC
HCT: 36.1 % (ref 36.0–46.0)
Hemoglobin: 11.4 g/dL — ABNORMAL LOW (ref 12.0–15.0)
MCH: 28.6 pg (ref 26.0–34.0)
MCHC: 31.6 g/dL (ref 30.0–36.0)
MCV: 90.5 fL (ref 80.0–100.0)
Platelets: 289 10*3/uL (ref 150–400)
RBC: 3.99 MIL/uL (ref 3.87–5.11)
RDW: 14.9 % (ref 11.5–15.5)
WBC: 4.9 10*3/uL (ref 4.0–10.5)
nRBC: 0 % (ref 0.0–0.2)

## 2022-08-29 LAB — COMPREHENSIVE METABOLIC PANEL
ALT: 12 U/L (ref 0–44)
AST: 19 U/L (ref 15–41)
Albumin: 3.3 g/dL — ABNORMAL LOW (ref 3.5–5.0)
Alkaline Phosphatase: 97 U/L (ref 38–126)
Anion gap: 10 (ref 5–15)
BUN: 20 mg/dL (ref 8–23)
CO2: 20 mmol/L — ABNORMAL LOW (ref 22–32)
Calcium: 9 mg/dL (ref 8.9–10.3)
Chloride: 110 mmol/L (ref 98–111)
Creatinine, Ser: 0.97 mg/dL (ref 0.44–1.00)
GFR, Estimated: >60 mL/min (ref >60)
Glucose, Bld: 86 mg/dL (ref 70–99)
Potassium: 3.8 mmol/L (ref 3.5–5.1)
Sodium: 140 mmol/L (ref 135–145)
Total Bilirubin: 0.5 mg/dL (ref 0.3–1.2)
Total Protein: 6.4 g/dL — ABNORMAL LOW (ref 6.5–8.1)

## 2022-08-29 LAB — DIFFERENTIAL
Abs Immature Granulocytes: 0.01 10*3/uL (ref 0.00–0.07)
Basophils Absolute: 0.1 10*3/uL (ref 0.0–0.1)
Basophils Relative: 1 %
Eosinophils Absolute: 0.2 10*3/uL (ref 0.0–0.5)
Eosinophils Relative: 4 %
Immature Granulocytes: 0 %
Lymphocytes Relative: 17 %
Lymphs Abs: 0.9 10*3/uL (ref 0.7–4.0)
Monocytes Absolute: 0.6 10*3/uL (ref 0.1–1.0)
Monocytes Relative: 13 %
Neutro Abs: 3.2 10*3/uL (ref 1.7–7.7)
Neutrophils Relative %: 65 %

## 2022-08-29 LAB — CBG MONITORING, ED: Glucose-Capillary: 78 mg/dL (ref 70–99)

## 2022-08-29 LAB — ETHANOL: Alcohol, Ethyl (B): <10 mg/dL (ref <10)

## 2022-08-29 LAB — PROTIME-INR
INR: 1.1 (ref 0.8–1.2)
Prothrombin Time: 14 seconds (ref 11.4–15.2)

## 2022-08-29 LAB — APTT: aPTT: 27 seconds (ref 24–36)

## 2022-08-29 MED ORDER — DIAZEPAM 5 MG/ML IJ SOLN
5.0000 mg | Freq: Once | INTRAMUSCULAR | Status: AC
  Administered 2022-08-29: 5 mg via INTRAVENOUS
  Filled 2022-08-29: qty 2

## 2022-08-29 MED ORDER — SODIUM CHLORIDE 0.9% FLUSH
3.0000 mL | Freq: Once | INTRAVENOUS | Status: AC
  Administered 2022-08-29: 3 mL via INTRAVENOUS

## 2022-08-29 NOTE — ED Triage Notes (Addendum)
Pt here e from home with family with c/o weakness that started today , is able to stand but get weak and starts shaking all over , also some slight memory issues , all started yesterday

## 2022-08-29 NOTE — ED Notes (Signed)
Per MRI, they have one more patient to scan and then they will come take pt for MRI.

## 2022-08-29 NOTE — ED Notes (Signed)
Patient transported to MRI 

## 2022-08-29 NOTE — ED Provider Notes (Signed)
[facility] Provider Note   CSN: [id number] Arrival date & time: 08/29/22  1435     History {Add pertinent medical, surgical, social history, OB history to HPI:1} Chief Complaint  Patient presents with   Weakness    [name] is a 72 y.o. female.   Weakness    Patient presents to the ED for evaluation of weakness.  Patient has history of anemia, chronic pain, fibromyalgia, chronic kidney disease, cellulitis, DVT, multiple falls, and avascular necrosis of the hips.  Patient states she did have's surgery last year on her hip.  She was recovering with physical therapy but was not able to complete it.  Patient states she has been using a wheelchair to help get around since then.  Yesterday however she had acute onset of an episode of trembling and shaking in her limbs.  She also complains of increasing weakness.  Patient states she has had difficulty getting up and walking around since yesterday.  She also feels like she is having difficulty with her speech with stuttering and remembering some things.  Home Medications Prior to Admission medications   Medication Sig Start Date End Date Taking? Authorizing Provider  betamethasone valerate ointment (VALISONE) 0.1 % Apply 1 application topically 2 (two) times daily. Use for 2 weeks for a flare.  Apply twice a week at bedtime for maintenance dosing. Patient not taking: Reported on 03/28/2022 01/16/21   Nunzio Cobbs, MD  buPROPion HCl ER, XL, 450 MG TB24 TAKE ONE TABLET BY MOUTH EVERY DAY 06/14/22   Ngetich, Dinah C, NP  cyclobenzaprine (FLEXERIL) 5 MG tablet Take 5 mg by mouth daily as needed for muscle spasms.    [provider]  Diclofenac Sodium CR 100 MG 24 hr tablet Take 1 tablet (100 mg total) by mouth daily. With Food, Appointment Due 09/28/21   Ngetich, Dinah C, NP  escitalopram (LEXAPRO) 20 MG tablet TAKE ONE TABLET BY MOUTH ONCE DAILY 09/21/21   Ngetich, Dinah C, NP   methocarbamol (ROBAXIN) 500 MG tablet TAKE 1 OR 2 TABLETS AS NEEDED FOR MUSCLE SPASM 05/11/22   Ngetich, Dinah C, NP  metoCLOPramide (REGLAN) 5 MG tablet TAKE ONE TABLET IN THE MORNING AND AT BEDTIME Patient taking differently: Take 5 mg by mouth daily. 01/01/22   Ngetich, Dinah C, NP  NP THYROID 60 MG tablet TAKE ONE TABLET EACH DAY BEFORE BREAKFAST 02/04/22   Ngetich, Dinah C, NP  ondansetron (ZOFRAN) 4 MG tablet Take 1 tablet (4 mg total) by mouth every 8 (eight) hours as needed for nausea or vomiting. 05/21/21   Ngetich, Dinah C, NP  ondansetron (ZOFRAN-ODT) 4 MG disintegrating tablet Take 1 tablet (4 mg total) by mouth every 8 (eight) hours as needed for nausea. 03/28/22   Larene Pickett, PA-C  pantoprazole (PROTONIX) 40 MG tablet Take 1 tablet (40 mg total) by mouth 2 (two) times daily before a meal. 05/17/22   Daryel November, MD  promethazine (PHENERGAN) 25 MG suppository Place 1 suppository (25 mg total) rectally every 6 (six) hours as needed for nausea or vomiting. Patient not taking: Reported on 03/28/2022 05/21/21   Ngetich, Dinah C, NP  RITALIN 5 MG tablet Take 1 tablet (5 mg total) by mouth daily as needed. Patient taking differently: Take 5 mg by mouth daily as needed (adhd). 09/11/21   Ngetich, Dinah C, NP  RITALIN LA 20 MG 24 hr capsule TAKE ONE CAPSULE EVERY DAY 12/30/21   Ngetich,  Dinah C, NP  sucralfate (CARAFATE) 1 GM/10ML suspension Take 10 mLs (1 g total) by mouth 4 (four) times daily. Take 10 mls four times daily before meals 03/31/22   Daryel November, MD  topiramate (TOPAMAX) 100 MG tablet TAKE ONE TABLET EVERY MORNING Patient taking differently: Take 100 mg by mouth every evening. 11/24/21   Ngetich, Dinah C, NP      Allergies    Amoxicillin-pot clavulanate, Clarithromycin, Fentanyl, Levofloxacin, Lorazepam, Amoxicillin, Doxycycline, Penicillins, and Sulfa antibiotics    Review of Systems   Review of Systems  Neurological:  Positive for weakness.    Physical  Exam Updated Vital Signs BP 117/87 (BP Location: Right Arm)   Pulse 82   Temp 99.1 F (37.3 C) (Oral)   Resp 13   LMP 07/19/2000 (Approximate)   SpO2 93%  Physical Exam Vitals and nursing note reviewed.  Constitutional:      General: She is not in acute distress.    Appearance: She is well-developed.  HENT:     Head: Normocephalic and atraumatic.     Right Ear: External ear normal.     Left Ear: External ear normal.  Eyes:     General: No scleral icterus.       Right eye: No discharge.        Left eye: No discharge.     Conjunctiva/sclera: Conjunctivae normal.  Neck:     Trachea: No tracheal deviation.  Cardiovascular:     Rate and Rhythm: Normal rate and regular rhythm.  Pulmonary:     Effort: Pulmonary effort is normal. No respiratory distress.     Breath sounds: Normal breath sounds. No stridor. No wheezing or rales.  Abdominal:     General: Bowel sounds are normal. There is no distension.     Palpations: Abdomen is soft.     Tenderness: There is no abdominal tenderness. There is no guarding or rebound.  Musculoskeletal:        General: No tenderness or deformity.     Cervical back: Neck supple.  Skin:    General: Skin is warm and dry.     Findings: No rash.  Neurological:     General: No focal deficit present.     Mental Status: She is alert.     GCS: GCS eye subscore is 4. GCS verbal subscore is 5. GCS motor subscore is 6.     Cranial Nerves: No cranial nerve deficit, dysarthria or facial asymmetry.     Sensory: No sensory deficit.     Motor: Tremor present. No abnormal muscle tone or seizure activity.     Coordination: Coordination normal. Finger-Nose-Finger Test normal.     Comments: Occasional stutter when speaking, tremor noted increases with movement  Psychiatric:        Mood and Affect: Mood normal.     ED Results / Procedures / Treatments   Labs (all labs ordered are listed, but only abnormal results are displayed) Labs Reviewed  PROTIME-INR   APTT  CBC  DIFFERENTIAL  COMPREHENSIVE METABOLIC PANEL  ETHANOL  CBG MONITORING, ED    EKG None  Radiology No results found.  Procedures Procedures  {Document cardiac monitor, telemetry assessment procedure when appropriate:1}  Medications Ordered in ED Medications  sodium chloride flush (NS) 0.9 % injection 3 mL (has no administration in time range)    ED Course/ Medical Decision Making/ A&P   {   Click here for ABCD2, HEART and other calculatorsREFRESH Note before signing :1}  Medical Decision Making Amount and/or Complexity of Data Reviewed Labs: ordered. Radiology: ordered.   ***  {Document critical care time when appropriate:1} {Document review of labs and clinical decision tools ie heart score, Chads2Vasc2 etc:1}  {Document your independent review of radiology images, and any outside records:1} {Document your discussion with family members, caretakers, and with consultants:1} {Document social determinants of health affecting pt's care:1} {Document your decision making why or why not admission, treatments were needed:1} Final Clinical Impression(s) / ED Diagnoses Final diagnoses:  None    Rx / DC Orders ED Discharge Orders     None

## 2022-08-29 NOTE — ED Notes (Signed)
Pts son Collier Salina would like to be called w/ updates and if have any questions [phone]  Pt is set up on the monitor and given warm blankets.

## 2022-08-30 NOTE — ED Notes (Signed)
Patient verbalizes understanding of d/c instructions. Opportunities for questions and answers were provided. Pt d/c from ED and wheeled to lobby where family picked pt up.

## 2022-08-30 NOTE — Discharge Instructions (Addendum)
The MRI did not show any acute strokes.  Laboratory test did not show any acute abnormalities.  Follow-up with a neurologist for further evaluation of the tremors and muscle spasms that you experience.  Return as needed for worsening symptoms.

## 2022-08-31 ENCOUNTER — Telehealth: Payer: Self-pay

## 2022-08-31 NOTE — Telephone Encounter (Signed)
Transition Care Management Unsuccessful Follow-up Telephone Call  Date of discharge and from where:  [facility]  Attempts:  1st Attempt  Reason for unsuccessful TCM follow-up call:  Left voice message

## 2022-09-02 ENCOUNTER — Telehealth: Payer: Self-pay

## 2022-09-02 NOTE — Transitions of Care (Post Inpatient/ED Visit) (Signed)
   09/02/2022  Name: Aris Moman MRN: [id number] DOB: April 13, 1951  Today's TOC FU Call Status:    Attempted to reach the patient regarding the most recent Inpatient/ED visit.  Follow Up Plan: No further outreach attempts will be made at this time. We have been unable to contact the patient.did leave voicemail for patient to call and schedule appointment asap  [place]

## 2022-09-03 ENCOUNTER — Ambulatory Visit: Payer: Medicare Other | Admitting: Family

## 2022-09-06 ENCOUNTER — Encounter: Payer: Self-pay | Admitting: Orthopedic Surgery

## 2022-09-06 ENCOUNTER — Ambulatory Visit (INDEPENDENT_AMBULATORY_CARE_PROVIDER_SITE_OTHER): Payer: Medicare Other | Admitting: Orthopedic Surgery

## 2022-09-06 VITALS — BP 100/78 | HR 77 | Temp 97.7°F | Resp 16 | Ht 64.0 in | Wt 125.6 lb

## 2022-09-06 DIAGNOSIS — R350 Frequency of micturition: Secondary | ICD-10-CM

## 2022-09-06 DIAGNOSIS — G252 Other specified forms of tremor: Secondary | ICD-10-CM | POA: Diagnosis not present

## 2022-09-06 DIAGNOSIS — Z96642 Presence of left artificial hip joint: Secondary | ICD-10-CM

## 2022-09-06 DIAGNOSIS — R531 Weakness: Secondary | ICD-10-CM | POA: Diagnosis not present

## 2022-09-06 DIAGNOSIS — F909 Attention-deficit hyperactivity disorder, unspecified type: Secondary | ICD-10-CM

## 2022-09-06 LAB — POCT URINALYSIS DIPSTICK
Bilirubin, UA: NEGATIVE
Blood, UA: NEGATIVE
Glucose, UA: NEGATIVE
Ketones, UA: POSITIVE
Leukocytes, UA: NEGATIVE
Nitrite, UA: POSITIVE
Protein, UA: NEGATIVE
Spec Grav, UA: 1.025 (ref 1.010–1.025)
Urobilinogen, UA: NEGATIVE E.U./dL — AB
pH, UA: 5 (ref 5.0–8.0)

## 2022-09-06 MED ORDER — RITALIN LA 20 MG PO CP24: ORAL_CAPSULE | ORAL | 0 refills | Status: DC

## 2022-09-06 MED ORDER — DICLOFENAC SODIUM ER 100 MG PO TB24: 100.0000 mg | ORAL_TABLET | Freq: Every day | ORAL | 3 refills | Status: DC

## 2022-09-06 NOTE — Patient Instructions (Addendum)
Please consider getting Shingrix (shingles vaccine) and Tdap (tetanus vaccine) at local pharmacy.   [place] fax: [phone] fax PT orders to office> we can help schedule outpatient PT  [place] RECORD TO OFFICE

## 2022-09-06 NOTE — Progress Notes (Unsigned)
Careteam: Patient Care Team: Ngetich, Nelda Bucks, NP as PCP - General (Family Medicine)  Seen by: Windell Moulding, AGNP-C  PLACE OF SERVICE:  [place]  Advanced Directive information    Allergies  Allergen Reactions   Amoxicillin-Pot Clavulanate Nausea And Vomiting and Other (See Comments)   Clarithromycin Other (See Comments)    DOES NOT REMEMBER REACTION DOES NOT REMEMBER REACTION    Fentanyl Nausea And Vomiting and Other (See Comments)    FENTANYL PATCH FENTANYL PATCH FENTANYL PATCH    Levofloxacin Swelling    Face,eye swelling Face,eye swelling    Lorazepam Other (See Comments)    DO NOT PUT ANY ATIVAN ON PATIENT'S IV POST SURGERY! DO NOT PUT ANY ATIVAN ON PATIENT'S IV POST SURGERY!    Amoxicillin    Doxycycline Nausea And Vomiting   Penicillins Hives   Sulfa Antibiotics Other (See Comments)    Per pt: unknown    No chief complaint on file.    HPI: Patient is a 72 y.o. female seen today for f/u s/p ED visit due to weakness.   02/11 she presented to the ED due to increased weakness shaking in limbs. She was also having difficulty with speech with stuttering at times. Lab work unremarkable> no anemias. CT head no acute abnormalities. MRI brain revealed no acute intracranial infarct or abnormality> exam limited> unable to tolerate full length of exam. Tremor noted on exam, but no focal deficits. She was discharged same day and advised to f/u with neurology> not scheduled.   [place]> she was in ICU for a couple days and then skilled nursing after. She plans to visit surgeon in person tomorrow. She reports having   Today she is ambulating with wheelchair. No recent falls.   She has also been having increased urinary frequency x 2 days. Incontinence at times. Denies fever or flank pain.       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  bilateral hips   Back fusion  [place]  [id number]   EYE SURGERY     eye muscle   KIDNEY SURGERY  1971   kidney reconstruction   LAMINECTOMY  1985   TOTAL HIP ARTHROPLASTY     Social History:   reports that she has never smoked. She has never used smokeless tobacco. She reports that she does not currently use alcohol. She reports that she does not use drugs.  Family History  Problem Relation Age of Onset   Cancer Mother        cancr of unknown origin   Cancer Sister    Hemochromatosis Sister    Colon cancer Neg Hx    Stomach cancer Neg Hx    Esophageal  cancer Neg Hx     Medications: Patient's Medications  New Prescriptions   No medications on file  Previous Medications   BUPROPION HCL ER, XL, 450 MG TB24    TAKE ONE TABLET BY MOUTH EVERY DAY   CYCLOBENZAPRINE (FLEXERIL) 5 MG TABLET    Take 5 mg by mouth daily as needed for muscle spasms.   DICLOFENAC SODIUM CR 100 MG 24 HR TABLET    Take 1 tablet (100 mg total) by mouth daily. With Food, Appointment Due   ESCITALOPRAM (LEXAPRO) 20 MG TABLET    TAKE ONE TABLET BY MOUTH ONCE DAILY   METHOCARBAMOL (ROBAXIN) 500 MG TABLET    TAKE 1 OR 2 TABLETS AS NEEDED FOR MUSCLE SPASM   METOCLOPRAMIDE (REGLAN) 5 MG TABLET    TAKE ONE TABLET IN THE MORNING AND AT BEDTIME   NP THYROID 60 MG TABLET    TAKE ONE TABLET EACH DAY BEFORE BREAKFAST   ONDANSETRON (ZOFRAN) 4 MG TABLET    Take 1 tablet (4 mg total) by mouth every 8 (eight) hours as needed for nausea or  vomiting.   ONDANSETRON (ZOFRAN-ODT) 4 MG DISINTEGRATING TABLET    Take 1 tablet (4 mg total) by mouth every 8 (eight) hours as needed for nausea.   PANTOPRAZOLE (PROTONIX) 40 MG TABLET    Take 1 tablet (40 mg total) by mouth 2 (two) times daily before a meal.   PROMETHAZINE (PHENERGAN) 25 MG SUPPOSITORY    Place 1 suppository (25 mg total) rectally every 6 (six) hours as needed for nausea or vomiting.   RITALIN 5 MG TABLET    Take 1 tablet (5 mg total) by mouth daily as needed.   RITALIN LA 20 MG 24 HR CAPSULE    TAKE ONE CAPSULE EVERY DAY   SUCRALFATE (CARAFATE) 1 GM/10ML SUSPENSION    Take 10 mLs (1 g total) by mouth 4 (four) times daily. Take 10 mls four times daily before meals   TOPIRAMATE (TOPAMAX) 100 MG TABLET    TAKE ONE TABLET EVERY MORNING  Modified Medications   No medications on file  Discontinued Medications   BETAMETHASONE VALERATE OINTMENT (VALISONE) 0.1 %    Apply 1 application topically 2 (two) times daily. Use for 2 weeks for a flare.  Apply twice a week at bedtime for maintenance dosing.    Physical Exam:  Vitals:   09/06/22 1409  BP: 100/78  Pulse: 77  Resp: 16  Temp: 97.7 F (36.5 C)  SpO2: 98%  Weight: 125 lb 9.6 oz (57 kg)  Height: 5' 4"$  (1.626 m)   Body mass index is 21.56 kg/m. Wt Readings from Last 3 Encounters:  09/06/22 125 lb 9.6 oz (57 kg)  03/29/22 126 lb 3.2 oz (57.2 kg)  03/27/22 124 lb (56.2 kg)    Physical Exam***  Labs reviewed: Basic Metabolic Panel: Recent Labs    03/25/22 1243 03/27/22 2334 08/29/22 1513  NA  --  141 140  K  --  4.1 3.8  CL  --  113* 110  CO2  --  23 20*  GLUCOSE  --  122* 86  BUN  --  22 20  CREATININE 1.00 1.15* 0.97  CALCIUM  --  9.3 9.0   Liver Function Tests: Recent Labs    03/27/22 2334 08/29/22 1513  AST 20 19  ALT 17 12  ALKPHOS 86 97  BILITOT 0.7 0.5  PROT 7.3 6.4*  ALBUMIN 4.2 3.3*   Recent Labs    03/27/22  2334  LIPASE 35   No results for input(s): "AMMONIA" in the last 8760  hours. CBC: Recent Labs    03/27/22 2334 08/29/22 1513  WBC 6.9 4.9  NEUTROABS 4.0 3.2  HGB 13.4 11.4*  HCT 41.5 36.1  MCV 93.0 90.5  PLT 264 289   Lipid Panel: No results for input(s): "CHOL", "HDL", "LDLCALC", "TRIG", "CHOLHDL", "LDLDIRECT" in the last 8760 hours. TSH: No results for input(s): "TSH" in the last 8760 hours. A1C: No results found for: "HGBA1C"   Assessment/Plan There are no diagnoses linked to this encounter.  Next appt: *** Burman Bruington Bradd[place], [facility] [phone]

## 2022-09-13 ENCOUNTER — Other Ambulatory Visit: Payer: Self-pay | Admitting: *Deleted

## 2022-09-13 DIAGNOSIS — F909 Attention-deficit hyperactivity disorder, unspecified type: Secondary | ICD-10-CM

## 2022-09-13 NOTE — Telephone Encounter (Signed)
Patient was seen on 09/06/22 by Windell Moulding, NP and Ritalin was called into pharmacy.  Ritalin is currently on Backorder and patient is needing the Generic to be sent to pharmacy instead.   Pended Rx and sent to Windell Moulding, NP for approval.

## 2022-09-14 MED ORDER — METHYLPHENIDATE HCL ER (LA) 20 MG PO CP24: ORAL_CAPSULE | ORAL | 0 refills | Status: DC

## 2022-09-17 ENCOUNTER — Telehealth: Payer: Self-pay

## 2022-09-17 NOTE — Telephone Encounter (Signed)
[name] with [facility] called and states that patient had surgery in [place] as you already know. However the surgeon has written orders for her to have Physical Therapy, and other orders that will faxed. This message is sent as Juluis Rainier

## 2022-09-18 ENCOUNTER — Other Ambulatory Visit: Payer: Self-pay | Admitting: Family

## 2022-09-18 DIAGNOSIS — F909 Attention-deficit hyperactivity disorder, unspecified type: Secondary | ICD-10-CM

## 2022-09-20 NOTE — Telephone Encounter (Signed)
Patient has no upcoming appointment.will refill medication this month but will not refill if no appointment by next month.

## 2022-09-20 NOTE — Telephone Encounter (Signed)
Patient has request refill on medication Ritalin '5mg'$ . Patient take Ritalin '20mg'$ . Patient last refill dated 09/14/2022. Medication pend and sent to PCP Ngetich, Nelda Bucks, NP

## 2022-09-22 ENCOUNTER — Telehealth: Payer: Self-pay

## 2022-09-22 NOTE — Telephone Encounter (Signed)
Langley Gauss, RN with [facility] called trying to clarify PT orders. She states that a non-linical person with [facility] told her that she was told that you would be following and signing PT orders for patient. She would like to know if you or the Dr. Bobbie Stack (who did her left posterior revision surgery is supposed to be signing orders. Please advise.  Message routed to Marlowe Sax, NP

## 2022-09-22 NOTE — Telephone Encounter (Signed)
I caled and spoke with Langley Gauss. She was informed of Dinah's response, she verbalized her understanding and agreed

## 2022-09-22 NOTE — Telephone Encounter (Signed)
Patient has not scheduled appointment with me.will need to send orders to Dr.Michael.

## 2022-10-06 ENCOUNTER — Other Ambulatory Visit: Payer: Self-pay | Admitting: Family

## 2022-10-06 NOTE — Telephone Encounter (Signed)
Please advise if ok to refill. 

## 2022-11-12 ENCOUNTER — Ambulatory Visit: Payer: Medicare Other | Admitting: Family

## 2022-11-15 ENCOUNTER — Ambulatory Visit (INDEPENDENT_AMBULATORY_CARE_PROVIDER_SITE_OTHER): Payer: Medicare Other | Admitting: Family

## 2022-11-15 ENCOUNTER — Encounter: Payer: Self-pay | Admitting: Family

## 2022-11-15 VITALS — BP 98/70 | HR 97 | Temp 98.7°F | Resp 16 | Wt 125.8 lb

## 2022-11-15 DIAGNOSIS — M25561 Pain in right knee: Secondary | ICD-10-CM | POA: Diagnosis not present

## 2022-11-15 DIAGNOSIS — Z13220 Encounter for screening for lipoid disorders: Secondary | ICD-10-CM

## 2022-11-15 DIAGNOSIS — G8929 Other chronic pain: Secondary | ICD-10-CM

## 2022-11-15 DIAGNOSIS — E039 Hypothyroidism, unspecified: Secondary | ICD-10-CM

## 2022-11-15 DIAGNOSIS — F909 Attention-deficit hyperactivity disorder, unspecified type: Secondary | ICD-10-CM | POA: Diagnosis not present

## 2022-11-15 DIAGNOSIS — G43109 Migraine with aura, not intractable, without status migrainosus: Secondary | ICD-10-CM

## 2022-11-15 DIAGNOSIS — D509 Iron deficiency anemia, unspecified: Secondary | ICD-10-CM

## 2022-11-15 DIAGNOSIS — G252 Other specified forms of tremor: Secondary | ICD-10-CM

## 2022-11-15 DIAGNOSIS — R2681 Unsteadiness on feet: Secondary | ICD-10-CM

## 2022-11-15 DIAGNOSIS — Z96642 Presence of left artificial hip joint: Secondary | ICD-10-CM

## 2022-11-15 MED ORDER — TOPIRAMATE 100 MG PO TABS: 100.0000 mg | ORAL_TABLET | Freq: Every morning | ORAL | 1 refills | Status: DC

## 2022-11-15 NOTE — Progress Notes (Unsigned)
Provider: Richarda Blade FNP-C   [name], Donalee Citrin, NP  Patient Care Team: Emerly Prak, Donalee Citrin, NP as PCP - General (Family Medicine)  Extended Emergency Contact Information Primary Emergency Contact: Juanda Chance [place] Mobile Phone: [phone] Relation: Son Secondary Emergency Contact: Pichard,Christian Mobile Phone: [phone] Relation: Son  Code Status: Full code Goals of care: Advanced Directive information    03/27/2022   10:36 PM  Advanced Directives  Does Patient Have a Medical Advance Directive? No     Chief Complaint  Patient presents with   Follow-up    Patient states that she has had a mini stroke this am. She took topamax and her baby ASA and it stopped. She goes to hospital if the medications dont work. Had suregery on Dec 7th and ended up being entubated, she was put in rehab she doesn't remember anything from the shot to put her to sleep until January 12 when she came to at rehab. She states that she lost a lit of blood. ( Hip surgery-removed all the metal and then put in her new pelvis and new hip in)    HPI:  Pt is a 72 y.o. female seen today for medical management of chronic diseases. states had a mini stroke this morning at home. She took topamax and her baby ASA which stopped the symptoms.Declined ED for further evaluation.States used it usually goes to the hospital if the medications does not work.   Had surgery on Dec 7th,2023 for left THA posterior revision in [place] by Dr.Michael Lorene Dy.she requires intubated and admission to ICU due to hypotension,EBL,drowsiness and delirium.she was transfused with 6 units of PRBC with improvement of condition.she was discharge to a rehab facility.  she does not remember anything from the shot to put her to sleep until January 12 when she came out of rehab. states lost a lot of blood. ( Hip surgery-removed all the metal prosthetic and then put in her new pelvis and new hip in).states left hip  pain has improved.continues to work with Physical Therapy. States has right knee pain and leg weakness.Has upcoming appointment May 29 th,2024 for possible injection to the knee. Denies any fall episode.Has been using wheelchair and has a walker uses to stand.  Describes appetite as good.No weight loss.    Past Medical History:  Diagnosis Date   ADHD    Anemia    Cellulitis 07/2018   left lower extremity   Chronic kidney disease    surgery to fix   Chronic pain    Depression    major depression   DVT (deep venous thrombosis) (HCC)    Ekbom's delusional parasitosis (HCC)    Fibromyalgia    History of Zenker's diverticulum removal    Lyme disease    Migraine with aura    Multiple falls    Thyroid disease    hypothyroid   Past Surgical History:  Procedure Laterality Date   APPENDECTOMY  1974   Avascular necrosis of the hip     multiple operations starting in 2005.  bilateral hips   Back fusion  1999   CERVICAL FUSION  2956,2130   EYE SURGERY     eye muscle   HIP SURGERY Left 06/2022   KIDNEY SURGERY  1971   kidney reconstruction   LAMINECTOMY  1985   TOTAL HIP ARTHROPLASTY      Allergies  Allergen Reactions   Amoxicillin-Pot Clavulanate Nausea And Vomiting and Other (See Comments)   Clarithromycin Other (See Comments)  DOES NOT REMEMBER REACTION DOES NOT REMEMBER REACTION    Fentanyl Nausea And Vomiting and Other (See Comments)    FENTANYL PATCH FENTANYL PATCH FENTANYL PATCH    Levofloxacin Swelling    Face,eye swelling Face,eye swelling    Lorazepam Other (See Comments)    DO NOT PUT ANY ATIVAN ON PATIENT'S IV POST SURGERY! DO NOT PUT ANY ATIVAN ON PATIENT'S IV POST SURGERY!    Amoxicillin    Doxycycline Nausea And Vomiting   Penicillins Hives   Sulfa Antibiotics Other (See Comments)    Per pt: unknown    Allergies as of 11/15/2022       Reactions   Amoxicillin-pot Clavulanate Nausea And Vomiting, Other (See Comments)   Clarithromycin Other  (See Comments)   DOES NOT REMEMBER REACTION DOES NOT REMEMBER REACTION   Fentanyl Nausea And Vomiting, Other (See Comments)   FENTANYL PATCH FENTANYL PATCH FENTANYL PATCH   Levofloxacin Swelling   Face,eye swelling Face,eye swelling   Lorazepam Other (See Comments)   DO NOT PUT ANY ATIVAN ON PATIENT'S IV POST SURGERY! DO NOT PUT ANY ATIVAN ON PATIENT'S IV POST SURGERY!   Amoxicillin    Doxycycline Nausea And Vomiting   Penicillins Hives   Sulfa Antibiotics Other (See Comments)   Per pt: unknown        Medication List        Accurate as of November 15, 2022  2:19 PM. If you have any questions, ask your nurse or doctor.          buPROPion HCl ER (XL) 450 MG Tb24 TAKE ONE TABLET BY MOUTH EVERY DAY   cyclobenzaprine 5 MG tablet Commonly known as: FLEXERIL TAKE ONE TABLET ONCE DAILY AS NEEDED   Diclofenac Sodium CR 100 MG 24 hr tablet Take 1 tablet (100 mg total) by mouth daily. With Food, Appointment Due   escitalopram 20 MG tablet Commonly known as: LEXAPRO TAKE ONE TABLET BY MOUTH ONCE DAILY   methocarbamol 500 MG tablet Commonly known as: ROBAXIN TAKE 1 OR 2 TABLETS AS NEEDED FOR MUSCLE SPASM   methylphenidate 20 MG 24 hr capsule Commonly known as: Ritalin LA TAKE ONE CAPSULE EVERY DAY   Ritalin 5 MG tablet Generic drug: methylphenidate TAKE ONE TABLET BY MOUTH DAILY AS NEEDED   metoCLOPramide 5 MG tablet Commonly known as: REGLAN TAKE ONE TABLET IN THE MORNING AND AT BEDTIME   NP Thyroid 60 MG tablet Generic drug: thyroid TAKE ONE TABLET EACH DAY BEFORE BREAKFAST   ondansetron 4 MG tablet Commonly known as: ZOFRAN Take 1 tablet (4 mg total) by mouth every 8 (eight) hours as needed for nausea or vomiting.   pantoprazole 40 MG tablet Commonly known as: PROTONIX Take 1 tablet (40 mg total) by mouth 2 (two) times daily before a meal.   sucralfate 1 GM/10ML suspension Commonly known as: CARAFATE Take 10 mLs (1 g total) by mouth 4 (four) times daily.  Take 10 mls four times daily before meals   topiramate 100 MG tablet Commonly known as: TOPAMAX TAKE ONE TABLET EVERY MORNING        Review of Systems  Constitutional:  Negative for appetite change, chills, fatigue, fever and unexpected weight change.  HENT:  Negative for congestion, dental problem, ear discharge, ear pain, facial swelling, hearing loss, nosebleeds, postnasal drip, rhinorrhea, sinus pressure, sinus pain, sneezing, sore throat, tinnitus and trouble swallowing.   Eyes:  Negative for pain, discharge, redness, itching and visual disturbance.  Respiratory:  Negative for cough, chest  tightness, shortness of breath and wheezing.   Cardiovascular:  Negative for chest pain, palpitations and leg swelling.  Gastrointestinal:  Negative for abdominal distention, abdominal pain, blood in stool, constipation, diarrhea, nausea and vomiting.  Endocrine: Negative for cold intolerance, heat intolerance, polydipsia, polyphagia and polyuria.  Genitourinary:  Negative for difficulty urinating, dysuria, flank pain, frequency and urgency.  Musculoskeletal:  Positive for arthralgias, back pain and gait problem. Negative for joint swelling, myalgias, neck pain and neck stiffness.  Skin:  Negative for color change, pallor, rash and wound.  Neurological:  Positive for tremors and weakness. Negative for dizziness, syncope, speech difficulty, light-headedness, numbness and headaches.       Right hand tremors > left   Hematological:  Does not bruise/bleed easily.  Psychiatric/Behavioral:  Negative for agitation, behavioral problems, confusion, hallucinations, self-injury, sleep disturbance and suicidal ideas. The patient is not nervous/anxious.     Immunization History  Administered Date(s) Administered   Influenza, High Dose Seasonal PF 06/09/2018, 09/07/2019   PFIZER(Purple Top)SARS-COV-2 Vaccination 11/19/2019, 02/12/2020   Pneumococcal Conjugate-13 08/22/2015   Pneumococcal Polysaccharide-23  11/10/2020   Pertinent  Health Maintenance Due  Topic Date Due   MAMMOGRAM  09/08/2013   COLONOSCOPY (Pts 45-66yrs Insurance coverage will need to be confirmed)  12/01/2021   INFLUENZA VACCINE  02/17/2023   DEXA SCAN  Completed      06/23/2021    4:11 PM 07/01/2021    3:27 PM 08/04/2021    3:59 PM 03/27/2022   10:36 PM 09/06/2022    2:13 PM  Fall Risk  Falls in the past year? 0 0 0  1  Was there an injury with Fall? 0 0 0  0  Fall Risk Category Calculator 0 0 0  1  Fall Risk Category (Retired) Low Low Low    (RETIRED) Patient Fall Risk Level Low fall risk Low fall risk Low fall risk Low fall risk   Patient at Risk for Falls Due to No Fall Risks No Fall Risks No Fall Risks  History of fall(s);Impaired balance/gait;Impaired mobility  Fall risk Follow up Falls evaluation completed Falls evaluation completed Falls evaluation completed  Falls evaluation completed;Education provided;Falls prevention discussed   Functional Status Survey:    Vitals:   11/15/22 1415  BP: 98/70  Pulse: 97  Resp: 16  Temp: 98.7 F (37.1 C)  TempSrc: Temporal  SpO2: 97%  Weight: 125 lb 12.8 oz (57.1 kg)   Body mass index is 21.59 kg/m. Physical Exam Vitals reviewed.  Constitutional:      General: She is not in acute distress.    Appearance: Normal appearance. She is normal weight. She is not ill-appearing or diaphoretic.  HENT:     Head: Normocephalic.     Right Ear: Tympanic membrane, ear canal and external ear normal. There is no impacted cerumen.     Left Ear: Tympanic membrane, ear canal and external ear normal. There is no impacted cerumen.     Nose: Nose normal. No congestion or rhinorrhea.     Mouth/Throat:     Mouth: Mucous membranes are moist.     Pharynx: Oropharynx is clear. No oropharyngeal exudate or posterior oropharyngeal erythema.  Eyes:     General: No scleral icterus.       Right eye: No discharge.        Left eye: No discharge.     Extraocular Movements: Extraocular  movements intact.     Conjunctiva/sclera: Conjunctivae normal.     Pupils: Pupils are equal, round, and  reactive to light.  Neck:     Vascular: No carotid bruit.  Cardiovascular:     Rate and Rhythm: Normal rate and regular rhythm.     Pulses: Normal pulses.     Heart sounds: Normal heart sounds. No murmur heard.    No friction rub. No gallop.  Pulmonary:     Effort: Pulmonary effort is normal. No respiratory distress.     Breath sounds: Normal breath sounds. No wheezing, rhonchi or rales.  Chest:     Chest wall: No tenderness.  Abdominal:     General: Bowel sounds are normal. There is no distension.     Palpations: Abdomen is soft. There is no mass.     Tenderness: There is no abdominal tenderness. There is no right CVA tenderness, left CVA tenderness, guarding or rebound.  Musculoskeletal:        General: No swelling or tenderness.     Cervical back: Normal range of motion. No rigidity or tenderness.     Right hip: Normal.     Left hip: No tenderness or crepitus. Decreased range of motion. Normal strength.     Right knee: Crepitus present. No swelling, effusion, erythema or ecchymosis. Decreased range of motion. No tenderness.     Left knee: Normal.     Right lower leg: No edema.     Left lower leg: No edema.  Lymphadenopathy:     Cervical: No cervical adenopathy.  Skin:    General: Skin is warm and dry.     Coloration: Skin is not pale.     Findings: No bruising, erythema, lesion or rash.  Neurological:     Mental Status: She is alert and oriented to person, place, and time.     Cranial Nerves: No cranial nerve deficit.     Sensory: No sensory deficit.     Motor: Weakness present.     Coordination: Coordination normal.     Gait: Gait abnormal.     Comments: Lower extremities weakness   Psychiatric:        Mood and Affect: Mood normal.        Speech: Speech normal.        Behavior: Behavior normal.        Thought Content: Thought content normal.        Judgment:  Judgment normal.    Labs reviewed: Recent Labs    03/25/22 1243 03/27/22 2334 08/29/22 1513  NA  --  141 140  K  --  4.1 3.8  CL  --  113* 110  CO2  --  23 20*  GLUCOSE  --  122* 86  BUN  --  22 20  CREATININE 1.00 1.15* 0.97  CALCIUM  --  9.3 9.0   Recent Labs    03/27/22 2334 08/29/22 1513  AST 20 19  ALT 17 12  ALKPHOS 86 97  BILITOT 0.7 0.5  PROT 7.3 6.4*  ALBUMIN 4.2 3.3*   Recent Labs    03/27/22 2334 08/29/22 1513  WBC 6.9 4.9  NEUTROABS 4.0 3.2  HGB 13.4 11.4*  HCT 41.5 36.1  MCV 93.0 90.5  PLT 264 289   Lab Results  Component Value Date   TSH 1.51 04/16/2021   No results found for: "HGBA1C" Lab Results  Component Value Date   CHOL 164 11/17/2020   HDL 78 11/17/2020   LDLCALC 72 11/17/2020   TRIG 49 11/17/2020   CHOLHDL 2.1 11/17/2020    Significant Diagnostic Results in last  30 days:  No results found.  Assessment/Plan 1. Migraine with aura and without status migrainosus, not intractable Symptoms controlled on Topamax - topiramate (TOPAMAX) 100 MG tablet; Take 1 tablet (100 mg total) by mouth every morning. Time needs name brand.  Dispense: 90 tablet; Refill: 1  2. Attention deficit hyperactivity disorder (ADHD), unspecified ADHD type Continues to require Ritalin - CBC with Differential/Platelet - COMPLETE METABOLIC PANEL WITH GFR  3. Unsteady gait Has completed in-home physical therapy for left hip pain post left hip arthropathy.  Requests referral to outpatient physical therapy for gait stability, range of motion exercises and muscle strengthening - Ambulatory referral to Physical Therapy  4. Chronic pain of right knee -Outpatient physical therapy ordered -Continue to follow-up with orthopedic as scheduled for Dec 15, 2022 - Ambulatory referral to Physical Therapy  5. Resting tremor Chronic No worsening of symptoms  6. S/P total left hip arthroplasty -Pain well-controlled - Ambulatory referral to Physical Therapy  7.  Screening for hyperlipidemia Dietary modification and exercise advised as tolerated  8. Acquired hypothyroidism Lab Results  Component Value Date   TSH 1.51 04/16/2021  -Continue on NP thyroid 60 mg tablet daily - TSH  9. Iron deficiency anemia, unspecified iron deficiency anemia type S/p 6 units transfusion of PRBC during left hip arthroplasty December 7th 2023.will recheck H/H  - CBC with Differential/Platelet  Family/ staff Communication: Reviewed plan of care with patient verbalized understanding  Labs/tests ordered:  - CBC with Differential/Platelet - COMPLETE METABOLIC PANEL WITH GFR - TSH Next Appointment : Return in about 6 months (around 05/17/2023) for medical mangement of chronic issues.Caesar Bookman, NP

## 2022-11-16 ENCOUNTER — Other Ambulatory Visit: Payer: Self-pay | Admitting: Family

## 2022-11-16 DIAGNOSIS — F909 Attention-deficit hyperactivity disorder, unspecified type: Secondary | ICD-10-CM

## 2022-11-16 LAB — CBC WITH DIFFERENTIAL/PLATELET
Absolute Monocytes: 582 cells/uL (ref 200–950)
Basophils Absolute: 58 cells/uL (ref 0–200)
Basophils Relative: 0.9 %
Eosinophils Absolute: 211 cells/uL (ref 15–500)
Eosinophils Relative: 3.3 %
HCT: 39.2 % (ref 35.0–45.0)
Hemoglobin: 12.7 g/dL (ref 11.7–15.5)
Lymphs Abs: 1120 cells/uL (ref 850–3900)
MCH: 27.9 pg (ref 27.0–33.0)
MCHC: 32.4 g/dL (ref 32.0–36.0)
MCV: 86.2 fL (ref 80.0–100.0)
MPV: 10 fL (ref 7.5–12.5)
Monocytes Relative: 9.1 %
Neutro Abs: 4429 cells/uL (ref 1500–7800)
Neutrophils Relative %: 69.2 %
Platelets: 309 10*3/uL (ref 140–400)
RBC: 4.55 10*6/uL (ref 3.80–5.10)
RDW: 15.7 % — ABNORMAL HIGH (ref 11.0–15.0)
Total Lymphocyte: 17.5 %
WBC: 6.4 10*3/uL (ref 3.8–10.8)

## 2022-11-16 LAB — COMPLETE METABOLIC PANEL WITH GFR
AG Ratio: 1.8 (calc) (ref 1.0–2.5)
ALT: 12 U/L (ref 6–29)
AST: 13 U/L (ref 10–35)
Albumin: 4.2 g/dL (ref 3.6–5.1)
Alkaline phosphatase (APISO): 90 U/L (ref 37–153)
BUN: 21 mg/dL (ref 7–25)
CO2: 23 mmol/L (ref 20–32)
Calcium: 9.3 mg/dL (ref 8.6–10.4)
Chloride: 109 mmol/L (ref 98–110)
Creat: 0.97 mg/dL (ref 0.60–1.00)
Globulin: 2.3 g/dL (calc) (ref 1.9–3.7)
Glucose, Bld: 107 mg/dL — ABNORMAL HIGH (ref 65–99)
Potassium: 4.1 mmol/L (ref 3.5–5.3)
Sodium: 138 mmol/L (ref 135–146)
Total Bilirubin: 0.3 mg/dL (ref 0.2–1.2)
Total Protein: 6.5 g/dL (ref 6.1–8.1)
eGFR: 62 mL/min/{1.73_m2} (ref >=60)

## 2022-11-16 LAB — TSH: TSH: 0.05 mIU/L — ABNORMAL LOW (ref 0.40–4.50)

## 2022-11-16 MED ORDER — METHYLPHENIDATE HCL 5 MG PO TABS: 5.0000 mg | ORAL_TABLET | Freq: Every day | ORAL | 0 refills | Status: DC | PRN

## 2022-11-16 NOTE — Telephone Encounter (Signed)
Patient was given 30 day supply of medication Cyclobenzaprine. Medication pend and sent to PCP Ngetich, Donalee Citrin, NP for additional refills.

## 2022-11-17 ENCOUNTER — Other Ambulatory Visit: Payer: Self-pay

## 2022-11-17 DIAGNOSIS — F909 Attention-deficit hyperactivity disorder, unspecified type: Secondary | ICD-10-CM

## 2022-11-17 MED ORDER — METHYLPHENIDATE HCL ER (LA) 20 MG PO CP24: ORAL_CAPSULE | ORAL | 0 refills | Status: DC

## 2022-11-17 NOTE — Telephone Encounter (Signed)
Please contact patient to come and sign treatment contract or mail.

## 2022-11-17 NOTE — Telephone Encounter (Signed)
Ritalin refilled

## 2022-11-17 NOTE — Telephone Encounter (Signed)
Patient called requesting refill on ritalin 20mg .Medication last refilled 09/14/2022   There is no up to date treatment agreement on file  Medication pended and sent to Richarda Blade, NP

## 2022-11-17 NOTE — Telephone Encounter (Signed)
Pharmacy requested refill.  Epic LR: 09/14/2022 Contract Date: 03/05/2021-added note to update at upcoming appointment.   Pended Rx and sent to [facility] for approval.

## 2022-11-17 NOTE — Telephone Encounter (Signed)
[name] called and states that patient medication Ritalin 20mg  has to have dispense as written code #1 substitution not allowed by prescriber. She states that this allows patient insurance to only cover brand name prescription.

## 2022-11-18 ENCOUNTER — Encounter: Payer: Self-pay | Admitting: Family

## 2022-11-19 NOTE — Transitions of Care (Post Inpatient/ED Visit) (Signed)
ERROR

## 2022-11-30 ENCOUNTER — Encounter: Payer: Medicare Other | Admitting: Family

## 2022-11-30 NOTE — Progress Notes (Signed)
  This encounter was created in error - please disregard. No show 

## 2022-12-02 ENCOUNTER — Ambulatory Visit (INDEPENDENT_AMBULATORY_CARE_PROVIDER_SITE_OTHER): Payer: Medicare Other | Admitting: Family

## 2022-12-02 ENCOUNTER — Encounter: Payer: Self-pay | Admitting: Family

## 2022-12-02 VITALS — BP 124/74 | HR 81 | Temp 97.6°F | Ht 64.0 in | Wt 124.0 lb

## 2022-12-02 DIAGNOSIS — F909 Attention-deficit hyperactivity disorder, unspecified type: Secondary | ICD-10-CM

## 2022-12-02 DIAGNOSIS — L989 Disorder of the skin and subcutaneous tissue, unspecified: Secondary | ICD-10-CM

## 2022-12-02 DIAGNOSIS — R739 Hyperglycemia, unspecified: Secondary | ICD-10-CM | POA: Diagnosis not present

## 2022-12-02 DIAGNOSIS — G252 Other specified forms of tremor: Secondary | ICD-10-CM

## 2022-12-02 DIAGNOSIS — G43109 Migraine with aura, not intractable, without status migrainosus: Secondary | ICD-10-CM | POA: Diagnosis not present

## 2022-12-02 MED ORDER — TOPIRAMATE 100 MG PO TABS: 100.0000 mg | ORAL_TABLET | Freq: Two times a day (BID) | ORAL | 1 refills | Status: DC

## 2022-12-02 NOTE — Progress Notes (Signed)
Provider: Richarda Blade FNP-C  Muslima Toppins, Donalee Citrin, NP  Patient Care Team: Crystle Carelli, Donalee Citrin, NP as PCP - General (Family Medicine)  Extended Emergency Contact Information Primary Emergency Contact: Juanda Chance [place] Mobile Phone: [phone] Relation: Son Secondary Emergency Contact: Pichard,Christian Mobile Phone: [phone] Relation: Son  Code Status:  Full Code  Goals of care: Advanced Directive information    03/27/2022   10:36 PM  Advanced Directives  Does Patient Have a Medical Advance Directive? No     Chief Complaint  Patient presents with   Acute Visit    Patient presents today for a medication review    HPI:  Pt is a 72 y.o. female seen today for an acute visit for medication review and draw Hgb A1C. States has upcoming appointment with Neurologist for tremors and stroke symptoms.states has been having TIA but does not want to go to ED. States takes Topamax daily but was previous told by Neurologist whenever she has a seizure to take twice daily.she would like refills to indicate twice daily.  Also would like Ritalin to be marked as trade name and not generic.Pharmacy told her need to be marked as dispense as written. States has follow appointment with orthopedic surgeon who operated on her left hip for evaluation of left foot which turns outward when she walks.  Past Medical History:  Diagnosis Date   ADHD    Anemia    Cellulitis 07/2018   left lower extremity   Chronic kidney disease    surgery to fix   Chronic pain    Depression    major depression   DVT (deep venous thrombosis) (HCC)    Ekbom's delusional parasitosis (HCC)    Fibromyalgia    History of Zenker's diverticulum removal    Lyme disease    Migraine with aura    Multiple falls    Thyroid disease    hypothyroid   Past Surgical History:  Procedure Laterality Date   APPENDECTOMY  1974   Avascular necrosis of the hip     multiple operations starting in 2005.   bilateral hips   Back fusion  1999   CERVICAL FUSION  9629,5284   EYE SURGERY     eye muscle   HIP SURGERY Left 06/2022   KIDNEY SURGERY  1971   kidney reconstruction   LAMINECTOMY  1985   TOTAL HIP ARTHROPLASTY      Allergies  Allergen Reactions   Amoxicillin-Pot Clavulanate Nausea And Vomiting and Other (See Comments)   Clarithromycin Other (See Comments)    DOES NOT REMEMBER REACTION DOES NOT REMEMBER REACTION    Fentanyl Nausea And Vomiting and Other (See Comments)    FENTANYL PATCH FENTANYL PATCH FENTANYL PATCH    Levofloxacin Swelling    Face,eye swelling Face,eye swelling    Lorazepam Other (See Comments)    DO NOT PUT ANY ATIVAN ON PATIENT'S IV POST SURGERY! DO NOT PUT ANY ATIVAN ON PATIENT'S IV POST SURGERY!    Amoxicillin    Doxycycline Nausea And Vomiting   Penicillins Hives   Sulfa Antibiotics Other (See Comments)    Per pt: unknown    Outpatient Encounter Medications as of 12/02/2022  Medication Sig   buPROPion HCl ER, XL, 450 MG TB24 TAKE ONE TABLET BY MOUTH EVERY DAY   cyclobenzaprine (FLEXERIL) 5 MG tablet TAKE ONE TABLET ONCE DAILY AS NEEDED   Diclofenac Sodium CR 100 MG 24 hr tablet Take 1 tablet (100 mg total) by mouth daily. With  Food, Appointment Due   escitalopram (LEXAPRO) 20 MG tablet TAKE ONE TABLET BY MOUTH ONCE DAILY   methocarbamol (ROBAXIN) 500 MG tablet TAKE 1 OR 2 TABLETS AS NEEDED FOR MUSCLE SPASM   methylphenidate (RITALIN LA) 20 MG 24 hr capsule TAKE ONE CAPSULE EVERY DAY   methylphenidate (RITALIN) 5 MG tablet Take 1 tablet (5 mg total) by mouth daily as needed.   metoCLOPramide (REGLAN) 5 MG tablet TAKE ONE TABLET IN THE MORNING AND AT BEDTIME   NP THYROID 60 MG tablet TAKE ONE TABLET EACH DAY BEFORE BREAKFAST   ondansetron (ZOFRAN) 4 MG tablet Take 1 tablet (4 mg total) by mouth every 8 (eight) hours as needed for nausea or vomiting.   pantoprazole (PROTONIX) 40 MG tablet Take 1 tablet (40 mg total) by mouth 2 (two) times daily  before a meal.   [DISCONTINUED] topiramate (TOPAMAX) 100 MG tablet Take 1 tablet (100 mg total) by mouth every morning. Time needs name brand.   topiramate (TOPAMAX) 100 MG tablet Take 1 tablet (100 mg total) by mouth 2 (two) times daily. Time needs name brand # 1   No facility-administered encounter medications on file as of 12/02/2022.    Review of Systems  Constitutional:  Negative for appetite change, chills, fatigue, fever and unexpected weight change.  Eyes:  Negative for pain, discharge, redness, itching and visual disturbance.  Respiratory:  Negative for cough, chest tightness, shortness of breath and wheezing.   Cardiovascular:  Negative for chest pain, palpitations and leg swelling.  Gastrointestinal:  Negative for abdominal distention, abdominal pain, blood in stool, constipation, diarrhea, nausea and vomiting.  Endocrine: Negative for cold intolerance, heat intolerance, polydipsia, polyphagia and polyuria.  Genitourinary:  Negative for difficulty urinating, dysuria, flank pain, frequency and urgency.  Musculoskeletal:  Positive for arthralgias and gait problem. Negative for back pain, joint swelling, myalgias, neck pain and neck stiffness.  Skin:  Negative for color change, pallor and rash.  Neurological:  Positive for tremors and headaches. Negative for dizziness, syncope, speech difficulty, weakness, light-headedness and numbness.       Migraine headaches   Hematological:        ADHD  Psychiatric/Behavioral:  Negative for agitation, behavioral problems, confusion, hallucinations, self-injury, sleep disturbance and suicidal ideas. The patient is not nervous/anxious.     Immunization History  Administered Date(s) Administered   Influenza, High Dose Seasonal PF 06/09/2018, 09/07/2019   PFIZER(Purple Top)SARS-COV-2 Vaccination 11/19/2019, 02/12/2020   Pneumococcal Conjugate-13 08/22/2015   Pneumococcal Polysaccharide-23 11/10/2020   Pertinent  Health Maintenance Due  Topic  Date Due   MAMMOGRAM  09/08/2013   COLONOSCOPY (Pts 45-57yrs Insurance coverage will need to be confirmed)  12/01/2021   INFLUENZA VACCINE  02/17/2023   DEXA SCAN  Completed      06/23/2021    4:11 PM 07/01/2021    3:27 PM 08/04/2021    3:59 PM 03/27/2022   10:36 PM 09/06/2022    2:13 PM  Fall Risk  Falls in the past year? 0 0 0  1  Was there an injury with Fall? 0 0 0  0  Fall Risk Category Calculator 0 0 0  1  Fall Risk Category (Retired) Low Low Low    (RETIRED) Patient Fall Risk Level Low fall risk Low fall risk Low fall risk Low fall risk   Patient at Risk for Falls Due to No Fall Risks No Fall Risks No Fall Risks  History of fall(s);Impaired balance/gait;Impaired mobility  Fall risk Follow up Falls evaluation completed  Falls evaluation completed Falls evaluation completed  Falls evaluation completed;Education provided;Falls prevention discussed   Functional Status Survey:    Vitals:   12/02/22 1530  BP: 124/74  Pulse: 81  Temp: 97.6 F (36.4 C)  SpO2: 94%  Weight: 124 lb (56.2 kg)  Height: 5\' 4"  (1.626 m)   Body mass index is 21.28 kg/m. Physical Exam Vitals reviewed.  Constitutional:      General: She is not in acute distress.    Appearance: Normal appearance. She is normal weight. She is not ill-appearing or diaphoretic.  HENT:     Head: Normocephalic.  Eyes:     General: No scleral icterus.       Right eye: No discharge.        Left eye: No discharge.     Conjunctiva/sclera: Conjunctivae normal.     Pupils: Pupils are equal, round, and reactive to light.  Neck:     Vascular: No carotid bruit.  Cardiovascular:     Rate and Rhythm: Normal rate and regular rhythm.     Pulses: Normal pulses.     Heart sounds: Normal heart sounds. No murmur heard.    No friction rub. No gallop.  Pulmonary:     Effort: Pulmonary effort is normal. No respiratory distress.     Breath sounds: Normal breath sounds. No wheezing, rhonchi or rales.  Chest:     Chest wall: No  tenderness.  Abdominal:     General: Bowel sounds are normal. There is no distension.     Palpations: Abdomen is soft. There is no mass.     Tenderness: There is no abdominal tenderness. There is no right CVA tenderness, left CVA tenderness, guarding or rebound.  Musculoskeletal:        General: No swelling or tenderness. Normal range of motion.     Cervical back: Normal range of motion. No rigidity or tenderness.     Right lower leg: No edema.     Left lower leg: No edema.     Comments: Walks with walker left foot turns outward when walking   Lymphadenopathy:     Cervical: No cervical adenopathy.  Skin:    General: Skin is warm and dry.     Coloration: Skin is not pale.     Findings: Lesion present. No erythema.     Comments: Chest wall skin lesion without any signs of infection  Neurological:     Mental Status: She is alert and oriented to person, place, and time.     Cranial Nerves: No cranial nerve deficit.     Sensory: No sensory deficit.     Motor: No weakness.     Coordination: Coordination normal.     Gait: Gait abnormal.  Psychiatric:        Mood and Affect: Mood normal.        Speech: Speech normal.        Behavior: Behavior normal.     Labs reviewed: Recent Labs    03/27/22 2334 08/29/22 1513 11/15/22 1457  NA 141 140 138  K 4.1 3.8 4.1  CL 113* 110 109  CO2 23 20* 23  GLUCOSE 122* 86 107*  BUN 22 20 21   CREATININE 1.15* 0.97 0.97  CALCIUM 9.3 9.0 9.3   Recent Labs    03/27/22 2334 08/29/22 1513 11/15/22 1457  AST 20 19 13   ALT 17 12 12   ALKPHOS 86 97  --   BILITOT 0.7 0.5 0.3  PROT 7.3 6.4* 6.5  ALBUMIN 4.2 3.3*  --    Recent Labs    03/27/22 2334 08/29/22 1513 11/15/22 1457  WBC 6.9 4.9 6.4  NEUTROABS 4.0 3.2 4,429  HGB 13.4 11.4* 12.7  HCT 41.5 36.1 39.2  MCV 93.0 90.5 86.2  PLT 264 289 309   Lab Results  Component Value Date   TSH 0.05 (L) 11/15/2022   No results found for: "HGBA1C" Lab Results  Component Value Date   CHOL  164 11/17/2020   HDL 78 11/17/2020   LDLCALC 72 11/17/2020   TRIG 49 11/17/2020   CHOLHDL 2.1 11/17/2020    Significant Diagnostic Results in last 30 days:  No results found.  Assessment/Plan 1. Migraine with aura and without status migrainosus, not intractable Chronic  Adjust Topamax to twice daily as previous instructed by her Neurologist  2. Resting tremor Continue to follow up with Neurologist   3. Skin lesion of chest wall Chest wall skin lesion without any signs of infection.will refer to dermatologist for evaluation - Ambulatory referral to Dermatology  4. Hyperglycemia Recent fasting lab work glucose 107  Will obtain A1C to rule out type 2 DM  - dietary modification and exercise as tolerated advised.  - Hemoglobin A1c  5. Attention deficit hyperactivity disorder (ADHD), unspecified ADHD type Continue on Ritalin marked as DAW for pharmacy as requested.   Family/ staff Communication: Reviewed plan of care with patient verbalized understanding  Labs/tests ordered: - Hemoglobin A1c  Next Appointment: Return if symptoms worsen or fail to improve.   Caesar Bookman, NP

## 2022-12-03 ENCOUNTER — Telehealth: Payer: Self-pay

## 2022-12-03 ENCOUNTER — Other Ambulatory Visit: Payer: Self-pay | Admitting: Family

## 2022-12-03 DIAGNOSIS — F909 Attention-deficit hyperactivity disorder, unspecified type: Secondary | ICD-10-CM

## 2022-12-03 DIAGNOSIS — G43109 Migraine with aura, not intractable, without status migrainosus: Secondary | ICD-10-CM

## 2022-12-03 LAB — HEMOGLOBIN A1C
Hgb A1c MFr Bld: 6 % of total Hgb — ABNORMAL HIGH (ref <5.7)
Mean Plasma Glucose: 126 mg/dL
eAG (mmol/L): 7 mmol/L

## 2022-12-03 MED ORDER — RITALIN LA 20 MG PO CP24
ORAL_CAPSULE | ORAL | 0 refills | Status: DC
Start: 2022-12-03 — End: 2023-02-18

## 2022-12-03 MED ORDER — METHYLPHENIDATE HCL ER (LA) 20 MG PO CP24
ORAL_CAPSULE | ORAL | 0 refills | Status: DC
Start: 2022-12-03 — End: 2022-12-03

## 2022-12-03 MED ORDER — TOPAMAX 100 MG PO TABS
100.0000 mg | ORAL_TABLET | Freq: Two times a day (BID) | ORAL | 1 refills | Status: DC
Start: 2022-12-03 — End: 2023-12-14

## 2022-12-03 NOTE — Telephone Encounter (Signed)
Ritalin refiled 

## 2022-12-03 NOTE — Telephone Encounter (Signed)
Brand name indicated on pharmacy note.EPIC changes automatically when brand name is ordered.unless send hard copy.

## 2022-12-03 NOTE — Telephone Encounter (Signed)
Incoming fax received from [facility] to initiate two prior authorizations:   Ritalin LA:    And for Topamax:    Awaiting response from insurance company

## 2022-12-03 NOTE — Telephone Encounter (Signed)
Pharmacy called stating that patient's retalin needs to be rewritten in order for ht insurance to [facility] brand name. Prescription needs to be written for Ritalin LA 20mg  24 hr capsul. It can not include "methylphenidate" at all. In the substitution section needs to say "substitutions not alowed" or DAW and resend to pharmacy. [facility]. Prescription has been changed and pended.  Message sent to Richarda Blade, NP

## 2022-12-03 NOTE — Telephone Encounter (Signed)
Noted  

## 2022-12-03 NOTE — Telephone Encounter (Signed)
Message left on clinical intake voicemail by patients pharmacist :  Per the pharmacist " RX sent in for Topamax needs to be resubmitted with the BRAND Name ONLY: Topamax,  the word topiramate can not be listed in the order at all."

## 2022-12-03 NOTE — Telephone Encounter (Signed)
RX is pending for provider to review and approve, selecting the DAW feature on the rx takes away the generic name as requested by the pharmacist.

## 2022-12-07 ENCOUNTER — Other Ambulatory Visit: Payer: Self-pay

## 2022-12-07 ENCOUNTER — Telehealth: Payer: Medicare Other

## 2022-12-07 DIAGNOSIS — R7303 Prediabetes: Secondary | ICD-10-CM

## 2022-12-07 DIAGNOSIS — E039 Hypothyroidism, unspecified: Secondary | ICD-10-CM

## 2022-12-07 NOTE — Telephone Encounter (Signed)
-   discontinue NP thyroid then  Send Synthroid 125 mcg tablet one by mouth daily as requested.

## 2022-12-07 NOTE — Telephone Encounter (Signed)
Patient stated that she no longer taking NP thyroid 60 MG and needs a new prescription of synthroid 125 MG to [facility]. This was discuss with Ngetich, Dinah C, NP in the last visit, per patient.

## 2022-12-08 MED ORDER — LEVOTHYROXINE SODIUM 125 MCG PO TABS: 125.0000 ug | ORAL_TABLET | Freq: Every day | ORAL | 1 refills | Status: DC

## 2022-12-08 NOTE — Addendum Note (Signed)
Addended by: Michaell Cowing on: 12/08/2022 09:06 AM   Modules accepted: Orders

## 2022-12-08 NOTE — Telephone Encounter (Signed)
Rx sent 

## 2022-12-25 ENCOUNTER — Other Ambulatory Visit: Payer: Self-pay | Admitting: Family

## 2022-12-25 DIAGNOSIS — F331 Major depressive disorder, recurrent, moderate: Secondary | ICD-10-CM

## 2022-12-27 NOTE — Telephone Encounter (Signed)
High warning came up when trying to refill medication.  Medication pended and sent to Richarda Blade, NP

## 2022-12-30 ENCOUNTER — Other Ambulatory Visit: Payer: Self-pay | Admitting: Family

## 2022-12-30 NOTE — Telephone Encounter (Signed)
Okay to add additional refills.

## 2022-12-30 NOTE — Telephone Encounter (Signed)
RX was only approved for #30 on 11/16/22 with no refills. Please advise if ok to give additional refills at this time

## 2023-01-10 ENCOUNTER — Other Ambulatory Visit: Payer: Self-pay | Admitting: Nurse Practitioner

## 2023-01-10 ENCOUNTER — Ambulatory Visit (INDEPENDENT_AMBULATORY_CARE_PROVIDER_SITE_OTHER): Payer: Medicare Other | Admitting: Nurse Practitioner

## 2023-01-10 ENCOUNTER — Encounter: Payer: Self-pay | Admitting: Nurse Practitioner

## 2023-01-10 VITALS — BP 139/88 | HR 74 | Temp 98.2°F | Resp 18 | Ht 64.0 in | Wt 122.0 lb

## 2023-01-10 DIAGNOSIS — Z515 Encounter for palliative care: Secondary | ICD-10-CM | POA: Diagnosis not present

## 2023-01-10 DIAGNOSIS — K219 Gastro-esophageal reflux disease without esophagitis: Secondary | ICD-10-CM

## 2023-01-10 DIAGNOSIS — F909 Attention-deficit hyperactivity disorder, unspecified type: Secondary | ICD-10-CM

## 2023-01-10 DIAGNOSIS — R1010 Upper abdominal pain, unspecified: Secondary | ICD-10-CM

## 2023-01-10 DIAGNOSIS — R7303 Prediabetes: Secondary | ICD-10-CM | POA: Insufficient documentation

## 2023-01-10 DIAGNOSIS — R1011 Right upper quadrant pain: Secondary | ICD-10-CM

## 2023-01-10 DIAGNOSIS — E039 Hypothyroidism, unspecified: Secondary | ICD-10-CM

## 2023-01-10 DIAGNOSIS — G43109 Migraine with aura, not intractable, without status migrainosus: Secondary | ICD-10-CM

## 2023-01-10 LAB — CBC WITH DIFFERENTIAL/PLATELET
Basophils Relative: 0.6 %
Eosinophils Absolute: 223 cells/uL (ref 15–500)
HCT: 40.4 % (ref 35.0–45.0)
MCHC: 32.7 g/dL (ref 32.0–36.0)
Neutro Abs: 7096 cells/uL (ref 1500–7800)
Neutrophils Relative %: 76.3 %
Platelets: 310 10*3/uL (ref 140–400)
WBC: 9.3 10*3/uL (ref 3.8–10.8)

## 2023-01-10 MED ORDER — HYDROMORPHONE HCL 2 MG PO TABS: 2.0000 mg | ORAL_TABLET | Freq: Four times a day (QID) | ORAL | 0 refills | Status: DC | PRN

## 2023-01-10 NOTE — Progress Notes (Signed)
Location:   [facility]   Place of Service:    Provider: Chipper Oman NP  Code Status: DNR Goals of Care:     03/27/2022   10:36 PM  Advanced Directives  Does Patient Have a Medical Advance Directive? No     Chief Complaint  Patient presents with   Acute Visit    severe pain in right side     HPI: Patient is a 72 y.o. female seen today for the right upper quadrant pain, sudden onset, nausea, vomited bile appearance emesis, denied chest pain, burning sensation upon urination, blood in urine. She is afebrile.    Hypothyroidism, takes Levothyroxine, TSH 0.05 11/15/22, pending TSH 2 months from 11/18/22  Migraine taking Topamax  ADHD taking Wellbutrin, Lexapro, Ritalin  GERD, taking Protonix, Hgb 12.7 11/15/22  Prediabetes Hgb A1c 6.0 11/15/22 ` Past Medical History:  Diagnosis Date   ADHD    Anemia    Cellulitis 07/2018   left lower extremity   Chronic kidney disease    surgery to fix   Chronic pain    Depression    major depression   DVT (deep venous thrombosis) (HCC)    Ekbom's delusional parasitosis (HCC)    Fibromyalgia    History of Zenker's diverticulum removal    Lyme disease    Migraine with aura    Multiple falls    Thyroid disease    hypothyroid    Past Surgical History:  Procedure Laterality Date   APPENDECTOMY  1974   Avascular necrosis of the hip     multiple operations starting in 2005.  bilateral hips   Back fusion  1999   CERVICAL FUSION  0981,[id number]   EYE SURGERY     eye muscle   HIP SURGERY Left 06/2022   KIDNEY SURGERY  1971   kidney reconstruction   LAMINECTOMY  1985   TOTAL HIP ARTHROPLASTY      Allergies  Allergen Reactions   Amoxicillin-Pot Clavulanate Nausea And Vomiting and Other (See Comments)   Clarithromycin Other (See Comments)    DOES NOT REMEMBER REACTION DOES NOT REMEMBER REACTION    Fentanyl Nausea And Vomiting and Other (See Comments)    FENTANYL PATCH FENTANYL PATCH FENTANYL PATCH    Levofloxacin Swelling     Face,eye swelling Face,eye swelling    Lorazepam Other (See Comments)    DO NOT PUT ANY ATIVAN ON PATIENT'S IV POST SURGERY! DO NOT PUT ANY ATIVAN ON PATIENT'S IV POST SURGERY!    Amoxicillin    Doxycycline Nausea And Vomiting   Penicillins Hives   Sulfa Antibiotics Other (See Comments)    Per pt: unknown    Allergies as of 01/10/2023       Reactions   Amoxicillin-pot Clavulanate Nausea And Vomiting, Other (See Comments)   Clarithromycin Other (See Comments)   DOES NOT REMEMBER REACTION DOES NOT REMEMBER REACTION   Fentanyl Nausea And Vomiting, Other (See Comments)   FENTANYL PATCH FENTANYL PATCH FENTANYL PATCH   Levofloxacin Swelling   Face,eye swelling Face,eye swelling   Lorazepam Other (See Comments)   DO NOT PUT ANY ATIVAN ON PATIENT'S IV POST SURGERY! DO NOT PUT ANY ATIVAN ON PATIENT'S IV POST SURGERY!   Amoxicillin    Doxycycline Nausea And Vomiting   Penicillins Hives   Sulfa Antibiotics Other (See Comments)   Per pt: unknown        Medication List        Accurate as of January 10, 2023  4:46 PM.  If you have any questions, ask your nurse or doctor.          buPROPion HCl ER (XL) 450 MG Tb24 TAKE ONE TABLET BY MOUTH EVERY DAY   cyclobenzaprine 5 MG tablet Commonly known as: FLEXERIL TAKE ONE TABLET ONCE DAILY AS NEEDED   Diclofenac Sodium CR 100 MG 24 hr tablet Take 1 tablet (100 mg total) by mouth daily. With Food, Appointment Due   escitalopram 20 MG tablet Commonly known as: LEXAPRO TAKE ONE TABLET BY MOUTH ONCE DAILY   HYDROmorphone 2 MG tablet Commonly known as: Dilaudid Take 1 tablet (2 mg total) by mouth every 6 (six) hours as needed for severe pain. Started by: Lyle Leisner X Enisa Runyan, NP   levothyroxine 125 MCG tablet Commonly known as: SYNTHROID Take 1 tablet (125 mcg total) by mouth daily.   methocarbamol 500 MG tablet Commonly known as: ROBAXIN TAKE 1 OR 2 TABLETS AS NEEDED FOR MUSCLE SPASM   methylphenidate 5 MG tablet Commonly known  as: Ritalin Take 1 tablet (5 mg total) by mouth daily as needed.   Ritalin LA 20 MG 24 hr capsule Generic drug: methylphenidate TAKE ONE CAPSULE EVERY DAY   metoCLOPramide 5 MG tablet Commonly known as: REGLAN TAKE ONE TABLET IN THE MORNING AND AT BEDTIME   ondansetron 4 MG tablet Commonly known as: ZOFRAN Take 1 tablet (4 mg total) by mouth every 8 (eight) hours as needed for nausea or vomiting.   pantoprazole 40 MG tablet Commonly known as: PROTONIX Take 1 tablet (40 mg total) by mouth 2 (two) times daily before a meal.   Topamax 100 MG tablet Generic drug: topiramate Take 1 tablet (100 mg total) by mouth 2 (two) times daily.        Review of Systems:  Review of Systems  Constitutional:  Positive for appetite change and fatigue. Negative for fever.  HENT:  Negative for congestion and trouble swallowing.   Eyes:  Negative for visual disturbance.  Respiratory:  Negative for cough and wheezing.   Cardiovascular:  Negative for chest pain, palpitations and leg swelling.  Gastrointestinal:  Positive for abdominal pain, nausea and vomiting. Negative for constipation.  Genitourinary:  Negative for dysuria, hematuria and urgency.  Musculoskeletal:  Positive for arthralgias and gait problem.  Skin:  Negative for color change.  Neurological:  Negative for speech difficulty, weakness and headaches.  Psychiatric/Behavioral:  Negative for behavioral problems and sleep disturbance. The patient is not nervous/anxious.     Health Maintenance  Topic Date Due   Hepatitis C Screening  Never done   DTaP/Tdap/Td (1 - Tdap) Never done   Zoster Vaccines- Shingrix (1 of 2) Never done   MAMMOGRAM  09/08/2013   Colonoscopy  12/01/2021   COVID-19 Vaccine (3 - Pfizer risk series) 03/18/2023 (Originally 03/11/2020)   INFLUENZA VACCINE  02/17/2023   Pneumonia Vaccine 75+ Years old  Completed   DEXA SCAN  Completed   HPV VACCINES  Aged Out    Physical Exam: Vitals:   01/10/23 1544  BP:  139/88  Pulse: 74  Resp: 18  Temp: 98.2 F (36.8 C)  SpO2: 97%  Weight: 122 lb (55.3 kg)  Height: 5\' 4"  (1.626 m)   Body mass index is 20.94 kg/m. Physical Exam Vitals and nursing note reviewed.  Constitutional:      Comments: fatigue  HENT:     Head: Normocephalic and atraumatic.     Nose: Nose normal.     Mouth/Throat:     Mouth: Mucous membranes  are dry.  Eyes:     General: No scleral icterus.    Extraocular Movements: Extraocular movements intact.     Conjunctiva/sclera: Conjunctivae normal.     Pupils: Pupils are equal, round, and reactive to light.  Cardiovascular:     Rate and Rhythm: Normal rate and regular rhythm.  Pulmonary:     Effort: Pulmonary effort is normal.     Breath sounds: No rales.  Abdominal:     General: Bowel sounds are normal. There is no distension.     Palpations: Abdomen is soft.     Tenderness: There is abdominal tenderness. There is no right CVA tenderness, left CVA tenderness, guarding or rebound.     Comments: RUQ pain, positive Murphy's sign.   Musculoskeletal:        General: Normal range of motion.     Cervical back: Normal range of motion and neck supple.     Right lower leg: No edema.     Left lower leg: No edema.  Skin:    General: Skin is warm and dry.  Neurological:     General: No focal deficit present.     Mental Status: She is alert and oriented to person, place, and time. Mental status is at baseline.     Gait: Gait abnormal.  Psychiatric:        Mood and Affect: Mood normal.        Behavior: Behavior normal.        Thought Content: Thought content normal.        Judgment: Judgment normal.     Labs reviewed: Basic Metabolic Panel: Recent Labs    03/27/22 2334 08/29/22 1513 11/15/22 1457  NA 141 140 138  K 4.1 3.8 4.1  CL 113* 110 109  CO2 23 20* 23  GLUCOSE 122* 86 107*  BUN 22 20 21   CREATININE 1.15* 0.97 0.97  CALCIUM 9.3 9.0 9.3  TSH  --   --  0.05*   Liver Function Tests: Recent Labs     03/27/22 2334 08/29/22 1513 11/15/22 1457  AST 20 19 13   ALT 17 12 12   ALKPHOS 86 97  --   BILITOT 0.7 0.5 0.3  PROT 7.3 6.4* 6.5  ALBUMIN 4.2 3.3*  --    Recent Labs    03/27/22 2334  LIPASE 35   No results for input(s): "AMMONIA" in the last 8760 hours. CBC: Recent Labs    03/27/22 2334 08/29/22 1513 11/15/22 1457  WBC 6.9 4.9 6.4  NEUTROABS 4.0 3.2 4,429  HGB 13.4 11.4* 12.7  HCT 41.5 36.1 39.2  MCV 93.0 90.5 86.2  PLT 264 289 309   Lipid Panel: No results for input(s): "CHOL", "HDL", "LDLCALC", "TRIG", "CHOLHDL", "LDLDIRECT" in the last 8760 hours. Lab Results  Component Value Date   HGBA1C 6.0 (H) 12/02/2022    Procedures since last visit: No results found.  Assessment/Plan  Right upper quadrant abdominal pain  the right upper quadrant pain, sudden onset, nausea, vomited bile appearance emesis, denied chest pain, burning sensation upon urination, blood in urine. She is afebrile.  + Murphy sign Gallstones vs pancreatitis vs kidney stones  CBC/diff, CMP/eGFR, lipase, amylase, UA C/S US gallbladder, liver, pancrease, kidney, ureter.   Gastroesophageal reflux disease taking Protonix, Hgb 12.7 11/15/22  Prediabetes Hgb A1c 6.0 11/15/22  Attention deficit hyperactivity disorder (ADHD)  taking Wellbutrin, Lexapro, Ritalin  Acquired hypothyroidism  takes Levothyroxine, TSH 0.05 11/15/22, pending TSH 2 months from 11/18/22  Migraine with aura taking  Topamax   Labs/tests ordered: CBC/diff, CMP/eGFR, lipase, Amylase, UA C/S, US liver, pancrease, gallbladder, kidney, ureter.  Next appt:  05/17/2023

## 2023-01-10 NOTE — Assessment & Plan Note (Signed)
taking Wellbutrin, Lexapro, Ritalin

## 2023-01-10 NOTE — Assessment & Plan Note (Signed)
takes Levothyroxine, TSH 0.05 11/15/22, pending TSH 2 months from 11/18/22

## 2023-01-10 NOTE — Assessment & Plan Note (Signed)
the right upper quadrant pain, sudden onset, nausea, vomited bile appearance emesis, denied chest pain, burning sensation upon urination, blood in urine. She is afebrile.  + Murphy sign Gallstones vs pancreatitis vs kidney stones  CBC/diff, CMP/eGFR, lipase, amylase, UA C/S US gallbladder, liver, pancrease, kidney, ureter.

## 2023-01-10 NOTE — Assessment & Plan Note (Signed)
taking Topamax

## 2023-01-10 NOTE — Assessment & Plan Note (Signed)
taking Protonix, Hgb 12.7 11/15/22

## 2023-01-10 NOTE — Assessment & Plan Note (Signed)
Hgb A1c 6.0 11/15/22

## 2023-01-11 ENCOUNTER — Other Ambulatory Visit: Payer: Self-pay | Admitting: Nurse Practitioner

## 2023-01-11 ENCOUNTER — Telehealth: Payer: Self-pay

## 2023-01-11 DIAGNOSIS — R1011 Right upper quadrant pain: Secondary | ICD-10-CM

## 2023-01-11 LAB — URINALYSIS
Bilirubin Urine: NEGATIVE
Glucose, UA: NEGATIVE
Ketones, ur: NEGATIVE
Leukocytes,Ua: NEGATIVE
Nitrite: NEGATIVE
Protein, ur: NEGATIVE
Specific Gravity, Urine: 1.011 (ref 1.001–1.035)
pH: 5.5 (ref 5.0–8.0)

## 2023-01-11 LAB — CBC WITH DIFFERENTIAL/PLATELET
Absolute Monocytes: 753 cells/uL (ref 200–950)
Basophils Absolute: 56 cells/uL (ref 0–200)
Eosinophils Relative: 2.4 %
Hemoglobin: 13.2 g/dL (ref 11.7–15.5)
Lymphs Abs: 1172 cells/uL (ref 850–3900)
MCH: 28.9 pg (ref 27.0–33.0)
MCV: 88.6 fL (ref 80.0–100.0)
MPV: 10.1 fL (ref 7.5–12.5)
Monocytes Relative: 8.1 %
RBC: 4.56 10*6/uL (ref 3.80–5.10)
RDW: 14 % (ref 11.0–15.0)
Total Lymphocyte: 12.6 %

## 2023-01-11 LAB — COMPLETE METABOLIC PANEL WITH GFR
AG Ratio: 1.7 (calc) (ref 1.0–2.5)
ALT: 14 U/L (ref 6–29)
AST: 15 U/L (ref 10–35)
Albumin: 4.2 g/dL (ref 3.6–5.1)
Alkaline phosphatase (APISO): 88 U/L (ref 37–153)
BUN: 16 mg/dL (ref 7–25)
CO2: 27 mmol/L (ref 20–32)
Calcium: 9.6 mg/dL (ref 8.6–10.4)
Chloride: 107 mmol/L (ref 98–110)
Creat: 0.85 mg/dL (ref 0.60–1.00)
Globulin: 2.5 g/dL (calc) (ref 1.9–3.7)
Glucose, Bld: 102 mg/dL — ABNORMAL HIGH (ref 65–99)
Potassium: 4.4 mmol/L (ref 3.5–5.3)
Sodium: 143 mmol/L (ref 135–146)
Total Bilirubin: 0.5 mg/dL (ref 0.2–1.2)
Total Protein: 6.7 g/dL (ref 6.1–8.1)
eGFR: 72 mL/min/{1.73_m2} (ref >=60)

## 2023-01-11 MED ORDER — PROMETHAZINE HCL 12.5 MG RE SUPP
12.5000 mg | Freq: Four times a day (QID) | RECTAL | 1 refills | Status: DC | PRN
Start: 2023-01-11 — End: 2023-01-28

## 2023-01-11 NOTE — Telephone Encounter (Signed)
I called patient and informed her that medication had been sent to the pharmacy. Patient stated that she thinks that she needs to go to the hospital and not wait until July 3rd. Please advise.

## 2023-01-11 NOTE — Telephone Encounter (Signed)
Patient informed that Man X Mast, NP agrees with her going to the hospital. Patient verbalized her understanding

## 2023-01-11 NOTE — Telephone Encounter (Signed)
Patient called requesting suppository for vomiting. She states that waiting until the July 3rd for the ultrasound. She states that she has been vomiting profusely since yesterday. Please advise  Message sent to Man X Mast, NP.

## 2023-01-13 ENCOUNTER — Other Ambulatory Visit: Payer: Medicare Other

## 2023-01-18 ENCOUNTER — Other Ambulatory Visit: Payer: Self-pay | Admitting: Family

## 2023-01-18 NOTE — Telephone Encounter (Signed)
Patient has request refill on medication Ritalin. Patient last refill dated 12/03/2022. Patient has Non Opioid Contract dated 03/17/2021. Patient has upcoming appointment 05/17/2023. Update Contract in appointment note. Medication pend and sent to [facility] due to PCP Ngetich, Donalee Citrin, NP being out of office.

## 2023-01-19 ENCOUNTER — Other Ambulatory Visit: Payer: Medicare Other

## 2023-01-21 ENCOUNTER — Telehealth: Payer: Self-pay

## 2023-01-21 NOTE — Telephone Encounter (Signed)
COVER MY MEDS PA - Methylphenidate  CARYL SIDELL (Key: [id number]) Rx #: [id number]

## 2023-01-22 ENCOUNTER — Other Ambulatory Visit: Payer: Self-pay | Admitting: Family

## 2023-01-25 ENCOUNTER — Telehealth: Payer: Self-pay

## 2023-01-25 NOTE — Telephone Encounter (Signed)
PA request received fro it mg tablet. PA initiated through covermymeds.Waiting on reply.

## 2023-01-26 ENCOUNTER — Ambulatory Visit (INDEPENDENT_AMBULATORY_CARE_PROVIDER_SITE_OTHER): Payer: Medicare Other | Admitting: Adult Health

## 2023-01-26 ENCOUNTER — Encounter: Payer: Self-pay | Admitting: Adult Health

## 2023-01-26 VITALS — BP 124/88 | HR 91 | Temp 98.1°F | Resp 18 | Ht 64.0 in | Wt 123.0 lb

## 2023-01-26 DIAGNOSIS — R21 Rash and other nonspecific skin eruption: Secondary | ICD-10-CM | POA: Diagnosis not present

## 2023-01-26 DIAGNOSIS — R1031 Right lower quadrant pain: Secondary | ICD-10-CM | POA: Diagnosis not present

## 2023-01-26 NOTE — Progress Notes (Signed)
[facility] clinic  Provider:  Kenard Gower DNP  Code Status:  Full Code  Goals of Care:     01/26/2023    3:07 PM  Advanced Directives  Does Patient Have a Medical Advance Directive? No  Would patient like information on creating a medical advance directive? No - Patient declined     Chief Complaint  Patient presents with   Acute Visit    Dark colored blotches on skin and throat raw     HPI: Patient is a 72 y.o. female seen today for an acute visit for skin rash. She has minimal flat rashes on her upper and lower extremities and neck. She stated that her rashes started 10 years ago. She thinks she got it from her ex husband. She denies itching. She stated that she thinks she has parasites inside her body. She stated that she travelled to [place] and [place] years ago and she started having a big circle on her back. She stated that when she urinates, she sees a lot of debris and seems to have something swimming in there. She stated that she was seen by a PA 7 years ago and her stool was sent to the veterinarian in [place] and was told she has parasites.  She complained of right lower quadrant abdominal pain, 8/10, which started 6/24. She was seen at [facility] on 01/10/23 and was complaining of right upper quadrant pain. She is for abdominal ultrasound on 01/31/23.  She stated that her urine has mucus, denies blood. She complained of increase in urinary frequency. She denies dysuria and urgency.   Past Medical History:  Diagnosis Date   ADHD    Anemia    Cellulitis 07/2018   left lower extremity   Chronic kidney disease    surgery to fix   Chronic pain    Depression    major depression   DVT (deep venous thrombosis) (HCC)    Ekbom's delusional parasitosis (HCC)    Fibromyalgia    History of Zenker's diverticulum removal    Lyme disease    Migraine with aura    Multiple falls    Thyroid disease    hypothyroid    Past Surgical History:  Procedure Laterality  Date   APPENDECTOMY  1974   Avascular necrosis of the hip     multiple operations starting in 2005.  bilateral hips   Back fusion  1999   CERVICAL FUSION  [id number]   EYE SURGERY     eye muscle   HIP SURGERY Left 06/2022   KIDNEY SURGERY  1971   kidney reconstruction   LAMINECTOMY  1985   TOTAL HIP ARTHROPLASTY      Allergies  Allergen Reactions   Amoxicillin-Pot Clavulanate Nausea And Vomiting and Other (See Comments)   Clarithromycin Other (See Comments)    DOES NOT REMEMBER REACTION DOES NOT REMEMBER REACTION    Fentanyl Nausea And Vomiting and Other (See Comments)    FENTANYL PATCH FENTANYL PATCH FENTANYL PATCH    Levofloxacin Swelling    Face,eye swelling Face,eye swelling    Lorazepam Other (See Comments)    DO NOT PUT ANY ATIVAN ON PATIENT'S IV POST SURGERY! DO NOT PUT ANY ATIVAN ON PATIENT'S IV POST SURGERY!    Amoxicillin    Doxycycline Nausea And Vomiting   Penicillins Hives   Sulfa Antibiotics Other (See Comments)    Per pt: unknown    Outpatient Encounter Medications as of 01/26/2023  Medication Sig   buPROPion HCl  ER, XL, 450 MG TB24 TAKE ONE TABLET BY MOUTH EVERY DAY   cyclobenzaprine (FLEXERIL) 5 MG tablet TAKE ONE TABLET ONCE DAILY AS NEEDED   Diclofenac Sodium CR 100 MG 24 hr tablet Take 1 tablet (100 mg total) by mouth daily. With Food, Appointment Due   escitalopram (LEXAPRO) 20 MG tablet TAKE ONE TABLET BY MOUTH ONCE DAILY   HYDROmorphone (DILAUDID) 2 MG tablet Take 1 tablet (2 mg total) by mouth every 6 (six) hours as needed for severe pain.   levothyroxine (SYNTHROID) 125 MCG tablet Take 1 tablet (125 mcg total) by mouth daily.   methocarbamol (ROBAXIN) 500 MG tablet TAKE 1 OR 2 TABLETS AS NEEDED FOR MUSCLE SPASM   metoCLOPramide (REGLAN) 5 MG tablet TAKE ONE TABLET IN THE MORNING AND AT BEDTIME   ondansetron (ZOFRAN) 4 MG tablet Take 1 tablet (4 mg total) by mouth every 8 (eight) hours as needed for nausea or vomiting.   pantoprazole  (PROTONIX) 40 MG tablet Take 1 tablet (40 mg total) by mouth 2 (two) times daily before a meal.   promethazine (PHENERGAN) 12.5 MG suppository Place 1 suppository (12.5 mg total) rectally every 6 (six) hours as needed for nausea or vomiting.   RITALIN 5 MG tablet TAKE ONE TABLET BY MOUTH DAILY AS NEEDED   RITALIN LA 20 MG 24 hr capsule TAKE ONE CAPSULE EVERY DAY   TOPAMAX 100 MG tablet Take 1 tablet (100 mg total) by mouth 2 (two) times daily.   No facility-administered encounter medications on file as of 01/26/2023.    Review of Systems:  Review of Systems  Constitutional:  Negative for appetite change, chills, fatigue and fever.  HENT:  Negative for congestion, hearing loss, rhinorrhea and sore throat.   Eyes: Negative.   Respiratory:  Negative for cough, shortness of breath and wheezing.   Cardiovascular:  Negative for chest pain, palpitations and leg swelling.  Gastrointestinal:  Negative for abdominal pain, constipation, diarrhea, nausea and vomiting.  Genitourinary:  Negative for dysuria.  Musculoskeletal:  Negative for arthralgias, back pain and myalgias.  Skin:  Negative for color change, rash and wound.  Neurological:  Negative for dizziness, weakness and headaches.  Psychiatric/Behavioral:  Negative for behavioral problems. The patient is not nervous/anxious.     Health Maintenance  Topic Date Due   Hepatitis C Screening  Never done   DTaP/Tdap/Td (1 - Tdap) Never done   Zoster Vaccines- Shingrix (1 of 2) Never done   MAMMOGRAM  09/08/2013   Colonoscopy  12/01/2021   COVID-19 Vaccine (3 - Pfizer risk series) 03/18/2023 (Originally 03/11/2020)   INFLUENZA VACCINE  02/17/2023   Pneumonia Vaccine 81+ Years old  Completed   DEXA SCAN  Completed   HPV VACCINES  Aged Out    Physical Exam: Vitals:   01/26/23 1531  BP: 124/88  Pulse: 91  Resp: 18  Temp: 98.1 F (36.7 C)  SpO2: 96%  Weight: 123 lb (55.8 kg)  Height: 5\' 4"  (1.626 m)   Body mass index is 21.11  kg/m. Physical Exam Constitutional:      General: She is not in acute distress.    Appearance: Normal appearance.  HENT:     Head: Normocephalic and atraumatic.     Nose: Nose normal.     Mouth/Throat:     Mouth: Mucous membranes are moist.  Eyes:     Conjunctiva/sclera: Conjunctivae normal.  Cardiovascular:     Rate and Rhythm: Normal rate and regular rhythm.  Pulmonary:  Effort: Pulmonary effort is normal.     Breath sounds: Normal breath sounds.  Abdominal:     General: Bowel sounds are normal.     Palpations: Abdomen is soft.  Musculoskeletal:        General: Normal range of motion.     Cervical back: Normal range of motion.  Skin:    General: Skin is warm and dry.     Comments: Minimal flat rashes on bilateral upper and lower extremities, has dry skin  Neurological:     General: No focal deficit present.     Mental Status: She is alert and oriented to person, place, and time.  Psychiatric:        Mood and Affect: Mood normal.        Behavior: Behavior normal.        Thought Content: Thought content normal.        Judgment: Judgment normal.     Labs reviewed: Basic Metabolic Panel: Recent Labs    08/29/22 1513 11/15/22 1457 01/10/23 1612  NA 140 138 143  K 3.8 4.1 4.4  CL 110 109 107  CO2 20* 23 27  GLUCOSE 86 107* 102*  BUN 20 21 16   CREATININE 0.97 0.97 0.85  CALCIUM 9.0 9.3 9.6  TSH  --  0.05*  --    Liver Function Tests: Recent Labs    03/27/22 2334 08/29/22 1513 11/15/22 1457 01/10/23 1612  AST 20 19 13 15   ALT 17 12 12 14   ALKPHOS 86 97  --   --   BILITOT 0.7 0.5 0.3 0.5  PROT 7.3 6.4* 6.5 6.7  ALBUMIN 4.2 3.3*  --   --    Recent Labs    03/27/22 2334  LIPASE 35   No results for input(s): "AMMONIA" in the last 8760 hours. CBC: Recent Labs    08/29/22 1513 11/15/22 1457 01/10/23 1612  WBC 4.9 6.4 9.3  NEUTROABS 3.2 4,429 7,096  HGB 11.4* 12.7 13.2  HCT 36.1 39.2 40.4  MCV 90.5 86.2 88.6  PLT 289 309 310   Lipid  Panel: No results for input(s): "CHOL", "HDL", "LDLCALC", "TRIG", "CHOLHDL", "LDLDIRECT" in the last 8760 hours. Lab Results  Component Value Date   HGBA1C 6.0 (H) 12/02/2022    Procedures since last visit: No results found.  Assessment/Plan  1. Rash -  skin is dry which is probably causing the minimal flat rashes -  instructed to continue body lotion to moisturize her skin - Ova and parasite examination  2. Right lower quadrant abdominal pain -  for abdominal ultrasound on 01/31/23 -  Urinalysis - Urine Culture     Labs/tests ordered:   stool ova and parasites, urinalysis and urine culture  Next appt:  05/17/2023

## 2023-01-26 NOTE — Telephone Encounter (Signed)
PA request approved until 01/26/2024. Approval letter is available under media tab

## 2023-01-27 ENCOUNTER — Telehealth: Payer: Self-pay

## 2023-01-27 NOTE — Telephone Encounter (Signed)
Refill request received from pharmacy for promethazine 25 mg suppository for 1/2 suppository every 6 hours as needed. Promethazine 12.5mg  suppository sent to pharmacy on 01/09/23.  Which one should be filled.  Message sent to Richarda Blade, NP to clarify and send to pharmacy.

## 2023-01-27 NOTE — Telephone Encounter (Signed)
Refill phenergan 12.5 mg supp. one PR every 6 hrs PRN

## 2023-01-28 ENCOUNTER — Other Ambulatory Visit: Payer: Self-pay

## 2023-01-28 DIAGNOSIS — R1011 Right upper quadrant pain: Secondary | ICD-10-CM

## 2023-01-28 MED ORDER — PROMETHAZINE HCL 12.5 MG RE SUPP: 12.5000 mg | Freq: Four times a day (QID) | RECTAL | 0 refills | Status: DC | PRN

## 2023-01-28 MED ORDER — PROMETHAZINE HCL 12.5 MG RE SUPP: 12.5000 mg | Freq: Four times a day (QID) | RECTAL | 1 refills | Status: DC | PRN

## 2023-01-31 ENCOUNTER — Ambulatory Visit: Payer: Medicare Other | Admitting: Family

## 2023-01-31 ENCOUNTER — Ambulatory Visit
Admission: RE | Admit: 2023-01-31 | Discharge: 2023-01-31 | Disposition: A | Discharge status: Home / Self Care | Payer: Medicare Other | Source: Ambulatory Visit | Attending: Nurse Practitioner | Admitting: Nurse Practitioner

## 2023-01-31 DIAGNOSIS — R1010 Upper abdominal pain, unspecified: Secondary | ICD-10-CM

## 2023-02-02 ENCOUNTER — Ambulatory Visit: Payer: Medicare Other | Admitting: Family

## 2023-02-02 ENCOUNTER — Encounter: Payer: Self-pay | Admitting: Family

## 2023-02-02 VITALS — BP 124/78 | HR 95 | Ht 64.0 in | Wt 125.2 lb

## 2023-02-02 DIAGNOSIS — R051 Acute cough: Secondary | ICD-10-CM | POA: Diagnosis not present

## 2023-02-02 DIAGNOSIS — R1031 Right lower quadrant pain: Secondary | ICD-10-CM | POA: Diagnosis not present

## 2023-02-02 MED ORDER — HYDROMORPHONE HCL 2 MG PO TABS: 2.0000 mg | ORAL_TABLET | Freq: Four times a day (QID) | ORAL | 0 refills | Status: DC | PRN

## 2023-02-02 MED ORDER — AZITHROMYCIN 250 MG PO TABS
ORAL_TABLET | ORAL | 0 refills | Status: DC
Start: 2023-02-02 — End: 2023-03-07

## 2023-02-02 NOTE — Progress Notes (Signed)
Provider: Richarda Blade FNP-C  Cathleen Yagi, Donalee Citrin, NP  Patient Care Team: Paiten Boies, Donalee Citrin, NP as PCP - General (Family Medicine)  Extended Emergency Contact Information Primary Emergency Contact: Juanda Chance [place] Mobile Phone: [phone] Relation: Son Secondary Emergency Contact: Pichard,Christian Mobile Phone: [phone] Relation: Son  Code Status:  Full Code  Goals of care: Advanced Directive information    01/26/2023    3:07 PM  Advanced Directives  Does Patient Have a Medical Advance Directive? No  Would patient like information on creating a medical advance directive? No - Patient declined     Chief Complaint  Patient presents with   Acute Visit    Right abdominal pain and cough     HPI:  Pt is a 72 y.o. female seen today for an acute visit for evaluation of right abdominal pain and cough.she is here with her son [name]. States since she had COVID-19 infection several months ago cough has lingered but recently worsen with coughing up greenish mucus.Also has nasal drainage.she denies any She denies any fever,chills,fatigue,body aches,runny nose,chest tightness,chest pain,palpitation or shortness of breath.Has not been in contact with sick person with COVID-19 infection. Also complains of right upper abdominal pain.states had a ultrasound done today at [facility] that was ordered 01/31/2023 by Mast Maxie,NP.thought results were sent here today.Had nausea and vomiting in the past associated with RUQ pain but none recently. States bowels moving daily but usually small,hard and round.discussed use of stool softener.does drink a lot of water.she request dilaudid refill prescribed by Mast,NP states helped with the pain.she will be travelling with family to [place] for a wedding and does not want to be in a lot of pain due to movement.    Past Medical History:  Diagnosis Date   ADHD    Anemia    Cellulitis 07/2018   left lower extremity   Chronic  kidney disease    surgery to fix   Chronic pain    Depression    major depression   DVT (deep venous thrombosis) (HCC)    Ekbom's delusional parasitosis (HCC)    Fibromyalgia    History of Zenker's diverticulum removal    Lyme disease    Migraine with aura    Multiple falls    Thyroid disease    hypothyroid   Past Surgical History:  Procedure Laterality Date   APPENDECTOMY  1974   Avascular necrosis of the hip     multiple operations starting in 2005.  bilateral hips   Back fusion  1999   CERVICAL FUSION  2956,2130   EYE SURGERY     eye muscle   HIP SURGERY Left 06/2022   KIDNEY SURGERY  1971   kidney reconstruction   LAMINECTOMY  1985   TOTAL HIP ARTHROPLASTY      Allergies  Allergen Reactions   Amoxicillin-Pot Clavulanate Nausea And Vomiting and Other (See Comments)   Clarithromycin Other (See Comments)    DOES NOT REMEMBER REACTION DOES NOT REMEMBER REACTION    Fentanyl Nausea And Vomiting and Other (See Comments)    FENTANYL PATCH FENTANYL PATCH FENTANYL PATCH    Levofloxacin Swelling    Face,eye swelling Face,eye swelling    Lorazepam Other (See Comments)    DO NOT PUT ANY ATIVAN ON PATIENT'S IV POST SURGERY! DO NOT PUT ANY ATIVAN ON PATIENT'S IV POST SURGERY!    Amoxicillin    Doxycycline Nausea And Vomiting   Penicillins Hives   Sulfa Antibiotics Other (See Comments)  Per pt: unknown    Outpatient Encounter Medications as of 02/02/2023  Medication Sig   azithromycin (ZITHROMAX) 250 MG tablet Take 2 tablets (500 mg ) by mouth x 1 dose then one tablet ( 250 mg ) by mouth daily x 4 days   buPROPion HCl ER, XL, 450 MG TB24 TAKE ONE TABLET BY MOUTH EVERY DAY   cyclobenzaprine (FLEXERIL) 5 MG tablet TAKE ONE TABLET ONCE DAILY AS NEEDED   escitalopram (LEXAPRO) 20 MG tablet TAKE ONE TABLET BY MOUTH ONCE DAILY   levothyroxine (SYNTHROID) 125 MCG tablet Take 1 tablet (125 mcg total) by mouth daily.   methocarbamol (ROBAXIN) 500 MG tablet TAKE 1 OR 2  TABLETS AS NEEDED FOR MUSCLE SPASM   promethazine (PHENERGAN) 12.5 MG suppository Place 1 suppository (12.5 mg total) rectally every 6 (six) hours as needed for nausea or vomiting.   RITALIN 5 MG tablet TAKE ONE TABLET BY MOUTH DAILY AS NEEDED   RITALIN LA 20 MG 24 hr capsule TAKE ONE CAPSULE EVERY DAY   TOPAMAX 100 MG tablet Take 1 tablet (100 mg total) by mouth 2 (two) times daily. (Patient taking differently: Take 100 mg by mouth as needed.)   [DISCONTINUED] Diclofenac Sodium CR 100 MG 24 hr tablet Take 1 tablet (100 mg total) by mouth daily. With Food, Appointment Due (Patient taking differently: Take 100 mg by mouth as needed for pain. With Food, Appointment Due)   [DISCONTINUED] HYDROmorphone (DILAUDID) 2 MG tablet Take 1 tablet (2 mg total) by mouth every 6 (six) hours as needed for severe pain.   HYDROmorphone (DILAUDID) 2 MG tablet Take 1 tablet (2 mg total) by mouth every 6 (six) hours as needed for severe pain.   [DISCONTINUED] metoCLOPramide (REGLAN) 5 MG tablet TAKE ONE TABLET IN THE MORNING AND AT BEDTIME (Patient not taking: Reported on 02/02/2023)   [DISCONTINUED] ondansetron (ZOFRAN) 4 MG tablet Take 1 tablet (4 mg total) by mouth every 8 (eight) hours as needed for nausea or vomiting.   [DISCONTINUED] pantoprazole (PROTONIX) 40 MG tablet Take 1 tablet (40 mg total) by mouth 2 (two) times daily before a meal. (Patient not taking: Reported on 02/02/2023)   No facility-administered encounter medications on file as of 02/02/2023.    Review of Systems  Constitutional:  Negative for appetite change, chills, fatigue, fever and unexpected weight change.  HENT:  Positive for rhinorrhea. Negative for congestion, dental problem, ear discharge, ear pain, facial swelling, hearing loss, nosebleeds, postnasal drip, sinus pressure, sinus pain, sneezing, sore throat, tinnitus and trouble swallowing.   Eyes:  Negative for pain, discharge, redness, itching and visual disturbance.  Respiratory:   Positive for cough. Negative for chest tightness, shortness of breath and wheezing.   Cardiovascular:  Negative for chest pain, palpitations and leg swelling.  Gastrointestinal:  Positive for abdominal pain. Negative for abdominal distention, blood in stool, constipation, diarrhea, nausea and vomiting.  Genitourinary:  Negative for urgency.  Musculoskeletal:  Positive for arthralgias and gait problem. Negative for back pain, joint swelling, myalgias, neck pain and neck stiffness.  Skin:  Negative for color change, pallor and rash.  Neurological:  Negative for dizziness, weakness, light-headedness and headaches.    Immunization History  Administered Date(s) Administered   Influenza, High Dose Seasonal PF 06/09/2018, 09/07/2019   Influenza-Unspecified 09/07/2019   PFIZER(Purple Top)SARS-COV-2 Vaccination 11/19/2019, 02/12/2020   Pneumococcal Conjugate-13 08/22/2015   Pneumococcal Polysaccharide-23 11/10/2020   Pertinent  Health Maintenance Due  Topic Date Due   MAMMOGRAM  09/08/2013   Colonoscopy  12/01/2021   INFLUENZA VACCINE  02/17/2023   DEXA SCAN  Completed      08/04/2021    3:59 PM 03/27/2022   10:36 PM 09/06/2022    2:13 PM 01/10/2023    3:36 PM 01/26/2023    3:29 PM  Fall Risk  Falls in the past year? 0  1 0 1  Was there an injury with Fall? 0  0 0 1  Fall Risk Category Calculator 0  1 0 3  Fall Risk Category (Retired) Low      (RETIRED) Patient Fall Risk Level Low fall risk Low fall risk     Patient at Risk for Falls Due to No Fall Risks  History of fall(s);Impaired balance/gait;Impaired mobility No Fall Risks No Fall Risks  Fall risk Follow up Falls evaluation completed  Falls evaluation completed;Education provided;Falls prevention discussed Falls evaluation completed Falls evaluation completed   Functional Status Survey:    Vitals:   02/02/23 1510  BP: 124/78  Pulse: 95  SpO2: 96%  Weight: 125 lb 3.2 oz (56.8 kg)  Height: 5\' 4"  (1.626 m)   Body mass index is  21.49 kg/m. Physical Exam Vitals reviewed.  Constitutional:      General: She is not in acute distress.    Appearance: Normal appearance. She is normal weight. She is not ill-appearing or diaphoretic.  HENT:     Head: Normocephalic.     Nose: Nose normal. No congestion or rhinorrhea.     Mouth/Throat:     Mouth: Mucous membranes are moist.     Pharynx: Oropharynx is clear. Posterior oropharyngeal erythema present. No oropharyngeal exudate.  Eyes:     General: No scleral icterus.       Right eye: No discharge.        Left eye: No discharge.     Extraocular Movements: Extraocular movements intact.     Conjunctiva/sclera: Conjunctivae normal.     Pupils: Pupils are equal, round, and reactive to light.  Cardiovascular:     Rate and Rhythm: Normal rate and regular rhythm.     Pulses: Normal pulses.     Heart sounds: Normal heart sounds. No murmur heard.    No friction rub. No gallop.  Pulmonary:     Effort: Pulmonary effort is normal. No respiratory distress.     Breath sounds: Normal breath sounds. No wheezing, rhonchi or rales.  Chest:     Chest wall: No tenderness.  Abdominal:     General: Bowel sounds are normal. There is no distension.     Palpations: Abdomen is soft. There is no mass.     Tenderness: There is abdominal tenderness in the right upper quadrant. There is no right CVA tenderness, left CVA tenderness, guarding or rebound. Negative signs include Murphy's sign.  Musculoskeletal:        General: No swelling or tenderness.     Cervical back: Normal range of motion. No rigidity or tenderness.     Right lower leg: No edema.     Left lower leg: No edema.     Comments: Left foot turned outward when walking   Lymphadenopathy:     Cervical: No cervical adenopathy.  Skin:    General: Skin is warm and dry.     Coloration: Skin is not pale.     Findings: No erythema or rash.  Neurological:     Mental Status: She is alert and oriented to person, place, and time.      Motor: No weakness.  Gait: Gait abnormal.     Comments: Walked with a cane this visit with son holding left hand for stability.   Psychiatric:        Mood and Affect: Mood normal.        Speech: Speech normal.        Behavior: Behavior normal.     Labs reviewed: Recent Labs    08/29/22 1513 11/15/22 1457 01/10/23 1612  NA 140 138 143  K 3.8 4.1 4.4  CL 110 109 107  CO2 20* 23 27  GLUCOSE 86 107* 102*  BUN 20 21 16   CREATININE 0.97 0.97 0.85  CALCIUM 9.0 9.3 9.6   Recent Labs    03/27/22 2334 08/29/22 1513 11/15/22 1457 01/10/23 1612  AST 20 19 13 15   ALT 17 12 12 14   ALKPHOS 86 97  --   --   BILITOT 0.7 0.5 0.3 0.5  PROT 7.3 6.4* 6.5 6.7  ALBUMIN 4.2 3.3*  --   --    Recent Labs    08/29/22 1513 11/15/22 1457 01/10/23 1612  WBC 4.9 6.4 9.3  NEUTROABS 3.2 4,429 7,096  HGB 11.4* 12.7 13.2  HCT 36.1 39.2 40.4  MCV 90.5 86.2 88.6  PLT 289 309 310   Lab Results  Component Value Date   TSH 0.05 (L) 11/15/2022   Lab Results  Component Value Date   HGBA1C 6.0 (H) 12/02/2022   Lab Results  Component Value Date   CHOL 164 11/17/2020   HDL 78 11/17/2020   LDLCALC 72 11/17/2020   TRIG 49 11/17/2020   CHOLHDL 2.1 11/17/2020    Significant Diagnostic Results in last 30 days:  No results found.  Assessment/Plan  1. Right lower quadrant abdominal pain RUQ ongoing pain.abdominal ultrasound done today previous ordered by Mast Maxie,NP.still awaiting ultrasound results. - N/V has resolved  - request refill on dilaudid.PDMP reviewed last filled 01/10/2023 no contract in chart.narcotic use contract given to sign today.   2. Acute cough Afebrile Bilateral lung clear to auscultation.posterior pharynx erythema noted.will start on abx.Has allergy to several antibiotics but starts has taken Z-pak without any side effects. - azithromycin (ZITHROMAX) 250 MG tablet; Take 2 tablets (500 mg ) by mouth x 1 dose then one tablet ( 250 mg ) by mouth daily x 4 days   Dispense: 6 tablet; Refill: 0 - advised to notify provider if symptoms worsen or not resolved  Family/ staff Communication: Reviewed plan of care with patient and son verbalized understanding   Labs/tests ordered: None   Next Appointment: Return if symptoms worsen or fail to improve.   Caesar Bookman, NP

## 2023-02-03 ENCOUNTER — Telehealth: Payer: Self-pay | Admitting: *Deleted

## 2023-02-03 NOTE — Telephone Encounter (Signed)
[name] called and stated that there is a Drug Interaction with the medication Prescribed yesterday between Azithromycin and Lexapro.   Stated that it is recommended to avoid using both due to it can cause Increased Prolong QT Interval in the heart and not recommended together.   Pharmacy wants to know if they should go ahead and give this to the patient or if you want to change it to something different.   Please Advise.

## 2023-02-03 NOTE — Telephone Encounter (Signed)
Patient called back and left message on Clinical intake Voicemail stating that she was just going to STOP taking the Lexapro while taking the Azithromycin.   Please Advise.

## 2023-02-03 NOTE — Telephone Encounter (Signed)
Noted  

## 2023-02-03 NOTE — Telephone Encounter (Signed)
-   discontinue Azithromycin  Patient has allergies to several antibiotics Verify with patient how severe was the nausea when she took doxycyline then May consider Doxycycline 100 mg tablet twice daily

## 2023-02-03 NOTE — Telephone Encounter (Signed)
Tried calling patient at both Home and Cell numbers, LMOM to return call.   Called Pharmacist, Onalee Hua at [facility] and informed him of Dinah's message and that we were waiting for patient to return our call.

## 2023-02-04 ENCOUNTER — Encounter: Payer: Self-pay | Admitting: Nurse Practitioner

## 2023-02-04 DIAGNOSIS — K8020 Calculus of gallbladder without cholecystitis without obstruction: Secondary | ICD-10-CM | POA: Insufficient documentation

## 2023-02-04 NOTE — Telephone Encounter (Signed)
Lori with [facility] Notified and agreed.

## 2023-02-17 ENCOUNTER — Ambulatory Visit: Payer: Medicare Other | Admitting: Family

## 2023-02-18 ENCOUNTER — Encounter: Payer: Self-pay | Admitting: Family

## 2023-02-18 ENCOUNTER — Ambulatory Visit (INDEPENDENT_AMBULATORY_CARE_PROVIDER_SITE_OTHER): Payer: Medicare Other | Admitting: Family

## 2023-02-18 VITALS — BP 120/80 | HR 94 | Temp 97.7°F | Resp 18 | Ht 64.0 in | Wt 127.0 lb

## 2023-02-18 DIAGNOSIS — Z96642 Presence of left artificial hip joint: Secondary | ICD-10-CM

## 2023-02-18 DIAGNOSIS — R051 Acute cough: Secondary | ICD-10-CM

## 2023-02-18 DIAGNOSIS — M25561 Pain in right knee: Secondary | ICD-10-CM

## 2023-02-18 DIAGNOSIS — F909 Attention-deficit hyperactivity disorder, unspecified type: Secondary | ICD-10-CM | POA: Diagnosis not present

## 2023-02-18 DIAGNOSIS — R7303 Prediabetes: Secondary | ICD-10-CM

## 2023-02-18 DIAGNOSIS — G8929 Other chronic pain: Secondary | ICD-10-CM

## 2023-02-18 MED ORDER — HYDROCODONE-ACETAMINOPHEN 5-325 MG PO TABS: 1.0000 | ORAL_TABLET | Freq: Three times a day (TID) | ORAL | 0 refills | Status: DC | PRN

## 2023-02-18 MED ORDER — RITALIN LA 20 MG PO CP24
ORAL_CAPSULE | ORAL | 0 refills | Status: DC
Start: 2023-02-18 — End: 2023-04-05

## 2023-02-18 NOTE — Patient Instructions (Signed)
-   Please get chest X-ray at [facility] at [address] then will call you with results. ? ?

## 2023-02-18 NOTE — Progress Notes (Signed)
Provider: Richarda Blade FNP-C  , Donalee Citrin, NP  Patient Care Team: , Donalee Citrin, NP as PCP - General (Family Medicine)  Extended Emergency Contact Information Primary Emergency Contact: Juanda Chance [place] of [place] Mobile Phone: [phone] Relation: Son Secondary Emergency Contact: Pichard,Christian Mobile Phone: [phone] Relation: Son  Code Status:  Full Code  Goals of care: Advanced Directive information    01/26/2023    3:07 PM  Advanced Directives  Does Patient Have a Medical Advance Directive? No  Would patient like information on creating a medical advance directive? No - Patient declined     Chief Complaint  Patient presents with   Acute Visit    Patient is here for medication refill    HPI:  Pt is a 72 y.o. female seen today for an acute visit for evaluation of dysuria but has improved.Also complains of worsening cough.She denies any fever,chills,fatigue,body aches,runny nose,chest tightness,chest pain,palpitation or shortness of breath. Also denies any Nausea or vomiting.    States dilaudid not effective has had take two tablets instead of one tablet.Request another medication to help with pain .   Past Medical History:  Diagnosis Date   ADHD    Anemia    Cellulitis 07/2018   left lower extremity   Chronic kidney disease    surgery to fix   Chronic pain    Depression    major depression   DVT (deep venous thrombosis) (HCC)    Ekbom's delusional parasitosis (HCC)    Fibromyalgia    History of Zenker's diverticulum removal    Lyme disease    Migraine with aura    Multiple falls    Thyroid disease    hypothyroid   Past Surgical History:  Procedure Laterality Date   APPENDECTOMY  1974   Avascular necrosis of the hip     multiple operations starting in 2005.  bilateral hips   Back fusion  1999   CERVICAL FUSION  2956,2130   EYE SURGERY     eye muscle   HIP SURGERY Left 06/2022   KIDNEY SURGERY  1971   kidney  reconstruction   LAMINECTOMY  1985   TOTAL HIP ARTHROPLASTY      Allergies  Allergen Reactions   Amoxicillin-Pot Clavulanate Nausea And Vomiting and Other (See Comments)   Clarithromycin Other (See Comments)    DOES NOT REMEMBER REACTION DOES NOT REMEMBER REACTION    Fentanyl Nausea And Vomiting and Other (See Comments)    FENTANYL PATCH FENTANYL PATCH FENTANYL PATCH    Levofloxacin Swelling    Face,eye swelling Face,eye swelling    Lorazepam Other (See Comments)    DO NOT PUT ANY ATIVAN ON PATIENT'S IV POST SURGERY! DO NOT PUT ANY ATIVAN ON PATIENT'S IV POST SURGERY!    Amoxicillin    Doxycycline Nausea And Vomiting   Penicillins Hives   Sulfa Antibiotics Other (See Comments)    Per pt: unknown    Outpatient Encounter Medications as of 02/18/2023  Medication Sig   azithromycin (ZITHROMAX) 250 MG tablet Take 2 tablets (500 mg ) by mouth x 1 dose then one tablet ( 250 mg ) by mouth daily x 4 days   buPROPion HCl ER, XL, 450 MG TB24 TAKE ONE TABLET BY MOUTH EVERY DAY   cyclobenzaprine (FLEXERIL) 5 MG tablet TAKE ONE TABLET ONCE DAILY AS NEEDED   escitalopram (LEXAPRO) 20 MG tablet TAKE ONE TABLET BY MOUTH ONCE DAILY   HYDROmorphone (DILAUDID) 2 MG tablet Take 1 tablet (2 mg  total) by mouth every 6 (six) hours as needed for severe pain.   levothyroxine (SYNTHROID) 125 MCG tablet Take 1 tablet (125 mcg total) by mouth daily.   methocarbamol (ROBAXIN) 500 MG tablet TAKE 1 OR 2 TABLETS AS NEEDED FOR MUSCLE SPASM   promethazine (PHENERGAN) 12.5 MG suppository Place 1 suppository (12.5 mg total) rectally every 6 (six) hours as needed for nausea or vomiting.   RITALIN 5 MG tablet TAKE ONE TABLET BY MOUTH DAILY AS NEEDED   RITALIN LA 20 MG 24 hr capsule TAKE ONE CAPSULE EVERY DAY   TOPAMAX 100 MG tablet Take 1 tablet (100 mg total) by mouth 2 (two) times daily. (Patient taking differently: Take 100 mg by mouth as needed.)   No facility-administered encounter medications on file as  of 02/18/2023.    Review of Systems  Constitutional:  Negative for appetite change, chills, fatigue, fever and unexpected weight change.  HENT:  Negative for congestion, dental problem, ear discharge, ear pain, facial swelling, hearing loss, nosebleeds, postnasal drip, rhinorrhea, sinus pressure, sinus pain, sneezing, sore throat, tinnitus and trouble swallowing.   Eyes:  Negative for pain, discharge, redness, itching and visual disturbance.  Respiratory:  Negative for cough, chest tightness, shortness of breath and wheezing.   Cardiovascular:  Negative for chest pain, palpitations and leg swelling.  Gastrointestinal:  Negative for abdominal distention, abdominal pain, blood in stool, constipation, diarrhea, nausea and vomiting.  Endocrine: Negative for cold intolerance, heat intolerance, polydipsia, polyphagia and polyuria.  Genitourinary:  Negative for difficulty urinating, dysuria, flank pain, frequency and urgency.  Musculoskeletal:  Negative for arthralgias, back pain, gait problem, joint swelling, myalgias, neck pain and neck stiffness.  Skin:  Negative for color change, pallor, rash and wound.  Neurological:  Negative for dizziness, syncope, speech difficulty, weakness, light-headedness, numbness and headaches.  Hematological:  Does not bruise/bleed easily.  Psychiatric/Behavioral:  Negative for agitation, behavioral problems, confusion, hallucinations, self-injury, sleep disturbance and suicidal ideas. The patient is not nervous/anxious.     Immunization History  Administered Date(s) Administered   Influenza, High Dose Seasonal PF 06/09/2018, 09/07/2019   Influenza-Unspecified 09/07/2019   PFIZER(Purple Top)SARS-COV-2 Vaccination 11/19/2019, 02/12/2020   Pneumococcal Conjugate-13 08/22/2015   Pneumococcal Polysaccharide-23 11/10/2020   Pertinent  Health Maintenance Due  Topic Date Due   MAMMOGRAM  09/08/2013   Colonoscopy  12/01/2021   INFLUENZA VACCINE  02/17/2023   DEXA SCAN   Completed      08/04/2021    3:59 PM 03/27/2022   10:36 PM 09/06/2022    2:13 PM 01/10/2023    3:36 PM 01/26/2023    3:29 PM  Fall Risk  Falls in the past year? 0  1 0 1  Was there an injury with Fall? 0  0 0 1  Fall Risk Category Calculator 0  1 0 3  Fall Risk Category (Retired) Low      (RETIRED) Patient Fall Risk Level Low fall risk Low fall risk     Patient at Risk for Falls Due to No Fall Risks  History of fall(s);Impaired balance/gait;Impaired mobility No Fall Risks No Fall Risks  Fall risk Follow up Falls evaluation completed  Falls evaluation completed;Education provided;Falls prevention discussed Falls evaluation completed Falls evaluation completed   Functional Status Survey:    Vitals:   02/18/23 1443  BP: 120/80  Pulse: 94  Resp: 18  Temp: 97.7 F (36.5 C)  SpO2: 97%  Weight: 127 lb (57.6 kg)  Height: 5\' 4"  (1.626 m)   Body mass index  is 21.8 kg/m. Physical Exam Vitals reviewed.  Constitutional:      General: She is not in acute distress.    Appearance: Normal appearance. She is normal weight. She is not ill-appearing or diaphoretic.  HENT:     Head: Normocephalic.     Right Ear: Tympanic membrane, ear canal and external ear normal. There is no impacted cerumen.     Left Ear: Tympanic membrane, ear canal and external ear normal. There is no impacted cerumen.     Nose: Nose normal. No congestion or rhinorrhea.     Mouth/Throat:     Mouth: Mucous membranes are moist.     Pharynx: Oropharynx is clear. No oropharyngeal exudate or posterior oropharyngeal erythema.  Eyes:     General: No scleral icterus.       Right eye: No discharge.        Left eye: No discharge.     Extraocular Movements: Extraocular movements intact.     Conjunctiva/sclera: Conjunctivae normal.     Pupils: Pupils are equal, round, and reactive to light.  Neck:     Vascular: No carotid bruit.  Cardiovascular:     Rate and Rhythm: Normal rate and regular rhythm.     Pulses: Normal pulses.      Heart sounds: Normal heart sounds. No murmur heard.    No friction rub. No gallop.  Pulmonary:     Effort: Pulmonary effort is normal. No respiratory distress.     Breath sounds: Normal breath sounds. No wheezing, rhonchi or rales.  Chest:     Chest wall: No tenderness.  Abdominal:     General: Bowel sounds are normal. There is no distension.     Palpations: Abdomen is soft. There is no mass.     Tenderness: There is no abdominal tenderness. There is no right CVA tenderness, left CVA tenderness, guarding or rebound.  Musculoskeletal:        General: No swelling or tenderness. Normal range of motion.     Cervical back: Normal range of motion. No rigidity or tenderness.     Right lower leg: No edema.     Left lower leg: No edema.     Comments: Left leg foot turned outward   Lymphadenopathy:     Cervical: No cervical adenopathy.  Skin:    General: Skin is warm and dry.     Coloration: Skin is not pale.     Findings: No bruising, erythema, lesion or rash.  Neurological:     Mental Status: She is alert and oriented to person, place, and time.     Cranial Nerves: No cranial nerve deficit.     Sensory: No sensory deficit.     Motor: No weakness.     Coordination: Coordination normal.     Gait: Gait normal.  Psychiatric:        Mood and Affect: Mood normal.        Speech: Speech normal.        Behavior: Behavior normal.        Thought Content: Thought content normal.        Judgment: Judgment normal.     Labs reviewed: Recent Labs    08/29/22 1513 11/15/22 1457 01/10/23 1612  NA 140 138 143  K 3.8 4.1 4.4  CL 110 109 107  CO2 20* 23 27  GLUCOSE 86 107* 102*  BUN 20 21 16   CREATININE 0.97 0.97 0.85  CALCIUM 9.0 9.3 9.6   Recent Labs  03/27/22 2334 08/29/22 1513 11/15/22 1457 01/10/23 1612  AST 20 19 13 15   ALT 17 12 12 14   ALKPHOS 86 97  --   --   BILITOT 0.7 0.5 0.3 0.5  PROT 7.3 6.4* 6.5 6.7  ALBUMIN 4.2 3.3*  --   --    Recent Labs     08/29/22 1513 11/15/22 1457 01/10/23 1612  WBC 4.9 6.4 9.3  NEUTROABS 3.2 4,429 7,096  HGB 11.4* 12.7 13.2  HCT 36.1 39.2 40.4  MCV 90.5 86.2 88.6  PLT 289 309 310   Lab Results  Component Value Date   TSH 0.05 (L) 11/15/2022   Lab Results  Component Value Date   HGBA1C 6.0 (H) 12/02/2022   Lab Results  Component Value Date   CHOL 164 11/17/2020   HDL 78 11/17/2020   LDLCALC 72 11/17/2020   TRIG 49 11/17/2020   CHOLHDL 2.1 11/17/2020    Significant Diagnostic Results in last 30 days:  US Abdomen Complete  Result Date: 02/03/2023 CLINICAL DATA:  Right upper quadrant abdominal pain for 2 weeks EXAM: ABDOMEN ULTRASOUND COMPLETE COMPARISON:  CT abdomen and pelvis with contrast March 28, 2022 FINDINGS: Gallbladder: Cholelithiasis, measuring up to 1.7 cm. No gallbladder wall thickening or pericholecystic fluid. No sonographic Murphy sign noted by sonographer. Common bile duct: Diameter: 4 mm Liver: No focal lesion identified. Within normal limits in parenchymal echogenicity. Portal vein is patent on color Doppler imaging with normal direction of blood flow towards the liver. IVC: No abnormality visualized. Pancreas: Visualized portion unremarkable. Spleen: Size and appearance within normal limits. Right Kidney: Length: 9.3 cm. Echogenicity within normal limits. No mass or hydronephrosis visualized. Left Kidney: Length: 9.3 cm. Echogenicity within normal limits. There is a 1.0 x 1.0 x 0.8 cm benign cyst versus prominent medullary pyramid in the upper pole. No hydronephrosis visualized. Abdominal aorta: No aneurysm visualized. Other findings: None. IMPRESSION: Cholelithiasis without evidence of acute cholecystitis. Electronically Signed   By: Jacob Moores M.D.   On: 02/03/2023 15:30    Assessment/Plan 1. Chronic pain of right knee Chronic  - dialudid ineffective for her. - PDMP reviewed  - discontiue dialudid then start on Norco  - Urine Drug Screen w/Alc, no  confirm([facility])  2. S/P total left hip arthroplasty Reports pain as above   3. Attention deficit hyperactivity disorder (ADHD), unspecified ADHD type Continue to require Ritalin  - RITALIN LA 20 MG 24 hr capsule; TAKE ONE CAPSULE EVERY DAY  Dispense: 30 capsule; Refill: 0  4. Acute cough Bilateral lung diminished  - DG Chest 2 View; Future  5. Prediabetes Dietary modification and exercise advised  - Hemoglobin A1c  Family/ staff Communication: Reviewed plan of care with patient verbalized understanding  Labs/tests ordered:  - DG Chest 2 View; Future - Hemoglobin A1c  Next Appointment: Return if symptoms worsen or fail to improve.   Caesar Bookman, NP

## 2023-02-22 ENCOUNTER — Telehealth: Payer: Self-pay

## 2023-02-22 NOTE — Telephone Encounter (Signed)
Patient called to explain that last night she began to have discomfort when urinating. Patient was seen last week and this was briefly discussed per the patient and at the time she was having urinary frequency and has since then started with discomfort and is requesting some type of treatment.  Patient is about to catch a flight in a couple of hours for a wedding and does not have time to come to the office   [facility]

## 2023-02-22 NOTE — Telephone Encounter (Signed)
Will need to get check up in urgent care if already travelling due to multiple allergies to medication prefer to get urine for culture.

## 2023-02-22 NOTE — Telephone Encounter (Signed)
Patient called back to say her flight is in about a hour and she needs this message prioritized

## 2023-02-22 NOTE — Telephone Encounter (Signed)
Mychart message sent to patient with providers response.

## 2023-02-23 NOTE — Telephone Encounter (Signed)
I left a detailed message with Dinah's response and suggested that patient call us if she has any additional needs upon returning to town.

## 2023-02-28 NOTE — Progress Notes (Incomplete)
Provider: Richarda Blade FNP-C  , Donalee Citrin, NP  Patient Care Team: , Donalee Citrin, NP as PCP - General (Family Medicine)  Extended Emergency Contact Information Primary Emergency Contact: Juanda Chance States of [place] Mobile Phone: [phone] Relation: Son Secondary Emergency Contact: Pichard,Christian Mobile Phone: [phone] Relation: Son  Code Status:  Full Code  Goals of care: Advanced Directive information    01/26/2023    3:07 PM  Advanced Directives  Does Patient Have a Medical Advance Directive? No  Would patient like information on creating a medical advance directive? No - Patient declined     Chief Complaint  Patient presents with  . Acute Visit    Patient is here for medication refill    HPI:  Pt is a 72 y.o. female seen today for an acute visit for evaluation of dysuria but has improved.Also complains of worsening cough.She denies any fever,chills,fatigue,body aches,runny nose,chest tightness,chest pain,palpitation or shortness of breath. Also denies any Nausea or vomiting.    States dilaudid not effective has had take two tablets instead of one tablet.Request another medication to help with pain .   Past Medical History:  Diagnosis Date  . ADHD   . Anemia   . Cellulitis 07/2018   left lower extremity  . Chronic kidney disease    surgery to fix  . Chronic pain   . Depression    major depression  . DVT (deep venous thrombosis) (HCC)   . Ekbom's delusional parasitosis (HCC)   . Fibromyalgia   . History of Zenker's diverticulum removal   . Lyme disease   . Migraine with aura   . Multiple falls   . Thyroid disease    hypothyroid   Past Surgical History:  Procedure Laterality Date  . APPENDECTOMY  1974  . Avascular necrosis of the hip     multiple operations starting in 2005.  bilateral hips  . Back fusion  1999  . CERVICAL FUSION  [id number]  . EYE SURGERY     eye muscle  . HIP SURGERY Left 06/2022  . KIDNEY  SURGERY  1971   kidney reconstruction  . LAMINECTOMY  1985  . TOTAL HIP ARTHROPLASTY      Allergies  Allergen Reactions  . Amoxicillin-Pot Clavulanate Nausea And Vomiting and Other (See Comments)  . Clarithromycin Other (See Comments)    DOES NOT REMEMBER REACTION DOES NOT REMEMBER REACTION   . Fentanyl Nausea And Vomiting and Other (See Comments)    FENTANYL PATCH FENTANYL PATCH FENTANYL PATCH   . Levofloxacin Swelling    Face,eye swelling Face,eye swelling   . Lorazepam Other (See Comments)    DO NOT PUT ANY ATIVAN ON PATIENT'S IV POST SURGERY! DO NOT PUT ANY ATIVAN ON PATIENT'S IV POST SURGERY!   . Amoxicillin   . Doxycycline Nausea And Vomiting  . Penicillins Hives  . Sulfa Antibiotics Other (See Comments)    Per pt: unknown    Outpatient Encounter Medications as of 02/18/2023  Medication Sig  . azithromycin (ZITHROMAX) 250 MG tablet Take 2 tablets (500 mg ) by mouth x 1 dose then one tablet ( 250 mg ) by mouth daily x 4 days  . buPROPion HCl ER, XL, 450 MG TB24 TAKE ONE TABLET BY MOUTH EVERY DAY  . cyclobenzaprine (FLEXERIL) 5 MG tablet TAKE ONE TABLET ONCE DAILY AS NEEDED  . escitalopram (LEXAPRO) 20 MG tablet TAKE ONE TABLET BY MOUTH ONCE DAILY  . HYDROmorphone (DILAUDID) 2 MG tablet Take 1 tablet (2 mg  total) by mouth every 6 (six) hours as needed for severe pain.  Marland Kitchen levothyroxine (SYNTHROID) 125 MCG tablet Take 1 tablet (125 mcg total) by mouth daily.  . methocarbamol (ROBAXIN) 500 MG tablet TAKE 1 OR 2 TABLETS AS NEEDED FOR MUSCLE SPASM  . promethazine (PHENERGAN) 12.5 MG suppository Place 1 suppository (12.5 mg total) rectally every 6 (six) hours as needed for nausea or vomiting.  Marland Kitchen RITALIN 5 MG tablet TAKE ONE TABLET BY MOUTH DAILY AS NEEDED  . RITALIN LA 20 MG 24 hr capsule TAKE ONE CAPSULE EVERY DAY  . TOPAMAX 100 MG tablet Take 1 tablet (100 mg total) by mouth 2 (two) times daily. (Patient taking differently: Take 100 mg by mouth as needed.)   No  facility-administered encounter medications on file as of 02/18/2023.    Review of Systems  Constitutional:  Negative for appetite change, chills, fatigue, fever and unexpected weight change.  HENT:  Negative for congestion, dental problem, ear discharge, ear pain, facial swelling, hearing loss, nosebleeds, postnasal drip, rhinorrhea, sinus pressure, sinus pain, sneezing, sore throat, tinnitus and trouble swallowing.   Eyes:  Negative for pain, discharge, redness, itching and visual disturbance.  Respiratory:  Negative for cough, chest tightness, shortness of breath and wheezing.   Cardiovascular:  Negative for chest pain, palpitations and leg swelling.  Gastrointestinal:  Negative for abdominal distention, abdominal pain, blood in stool, constipation, diarrhea, nausea and vomiting.  Endocrine: Negative for cold intolerance, heat intolerance, polydipsia, polyphagia and polyuria.  Genitourinary:  Negative for difficulty urinating, dysuria, flank pain, frequency and urgency.  Musculoskeletal:  Negative for arthralgias, back pain, gait problem, joint swelling, myalgias, neck pain and neck stiffness.  Skin:  Negative for color change, pallor, rash and wound.  Neurological:  Negative for dizziness, syncope, speech difficulty, weakness, light-headedness, numbness and headaches.  Hematological:  Does not bruise/bleed easily.  Psychiatric/Behavioral:  Negative for agitation, behavioral problems, confusion, hallucinations, self-injury, sleep disturbance and suicidal ideas. The patient is not nervous/anxious.     Immunization History  Administered Date(s) Administered  . Influenza, High Dose Seasonal PF 06/09/2018, 09/07/2019  . Influenza-Unspecified 09/07/2019  . PFIZER(Purple Top)SARS-COV-2 Vaccination 11/19/2019, 02/12/2020  . Pneumococcal Conjugate-13 08/22/2015  . Pneumococcal Polysaccharide-23 11/10/2020   Pertinent  Health Maintenance Due  Topic Date Due  . MAMMOGRAM  09/08/2013  .  Colonoscopy  12/01/2021  . INFLUENZA VACCINE  02/17/2023  . DEXA SCAN  Completed      08/04/2021    3:59 PM 03/27/2022   10:36 PM 09/06/2022    2:13 PM 01/10/2023    3:36 PM 01/26/2023    3:29 PM  Fall Risk  Falls in the past year? 0  1 0 1  Was there an injury with Fall? 0  0 0 1  Fall Risk Category Calculator 0  1 0 3  Fall Risk Category (Retired) Low      (RETIRED) Patient Fall Risk Level Low fall risk Low fall risk     Patient at Risk for Falls Due to No Fall Risks  History of fall(s);Impaired balance/gait;Impaired mobility No Fall Risks No Fall Risks  Fall risk Follow up Falls evaluation completed  Falls evaluation completed;Education provided;Falls prevention discussed Falls evaluation completed Falls evaluation completed   Functional Status Survey:    Vitals:   02/18/23 1443  BP: 120/80  Pulse: 94  Resp: 18  Temp: 97.7 F (36.5 C)  SpO2: 97%  Weight: 127 lb (57.6 kg)  Height: 5\' 4"  (1.626 m)   Body mass index  is 21.8 kg/m. Physical Exam  Labs reviewed: Recent Labs    08/29/22 1513 11/15/22 1457 01/10/23 1612  NA 140 138 143  K 3.8 4.1 4.4  CL 110 109 107  CO2 20* 23 27  GLUCOSE 86 107* 102*  BUN 20 21 16   CREATININE 0.97 0.97 0.85  CALCIUM 9.0 9.3 9.6   Recent Labs    03/27/22 2334 08/29/22 1513 11/15/22 1457 01/10/23 1612  AST 20 19 13 15   ALT 17 12 12 14   ALKPHOS 86 97  --   --   BILITOT 0.7 0.5 0.3 0.5  PROT 7.3 6.4* 6.5 6.7  ALBUMIN 4.2 3.3*  --   --    Recent Labs    08/29/22 1513 11/15/22 1457 01/10/23 1612  WBC 4.9 6.4 9.3  NEUTROABS 3.2 4,429 7,096  HGB 11.4* 12.7 13.2  HCT 36.1 39.2 40.4  MCV 90.5 86.2 88.6  PLT 289 309 310   Lab Results  Component Value Date   TSH 0.05 (L) 11/15/2022   Lab Results  Component Value Date   HGBA1C 6.0 (H) 12/02/2022   Lab Results  Component Value Date   CHOL 164 11/17/2020   HDL 78 11/17/2020   LDLCALC 72 11/17/2020   TRIG 49 11/17/2020   CHOLHDL 2.1 11/17/2020    Significant  Diagnostic Results in last 30 days:  US Abdomen Complete  Result Date: 02/03/2023 CLINICAL DATA:  Right upper quadrant abdominal pain for 2 weeks EXAM: ABDOMEN ULTRASOUND COMPLETE COMPARISON:  CT abdomen and pelvis with contrast March 28, 2022 FINDINGS: Gallbladder: Cholelithiasis, measuring up to 1.7 cm. No gallbladder wall thickening or pericholecystic fluid. No sonographic Murphy sign noted by sonographer. Common bile duct: Diameter: 4 mm Liver: No focal lesion identified. Within normal limits in parenchymal echogenicity. Portal vein is patent on color Doppler imaging with normal direction of blood flow towards the liver. IVC: No abnormality visualized. Pancreas: Visualized portion unremarkable. Spleen: Size and appearance within normal limits. Right Kidney: Length: 9.3 cm. Echogenicity within normal limits. No mass or hydronephrosis visualized. Left Kidney: Length: 9.3 cm. Echogenicity within normal limits. There is a 1.0 x 1.0 x 0.8 cm benign cyst versus prominent medullary pyramid in the upper pole. No hydronephrosis visualized. Abdominal aorta: No aneurysm visualized. Other findings: None. IMPRESSION: Cholelithiasis without evidence of acute cholecystitis. Electronically Signed   By: Jacob Moores M.D.   On: 02/03/2023 15:30    Assessment/Plan There are no diagnoses linked to this encounter.   Family/ staff Communication: Reviewed plan of care with patient  Labs/tests ordered: None   Next Appointment:   Caesar Bookman, NP

## 2023-03-07 ENCOUNTER — Encounter: Payer: Self-pay | Admitting: Diagnostic Neuroimaging

## 2023-03-07 ENCOUNTER — Ambulatory Visit (INDEPENDENT_AMBULATORY_CARE_PROVIDER_SITE_OTHER): Payer: Medicare Other | Admitting: Diagnostic Neuroimaging

## 2023-03-07 DIAGNOSIS — R531 Weakness: Secondary | ICD-10-CM

## 2023-03-07 DIAGNOSIS — R200 Anesthesia of skin: Secondary | ICD-10-CM | POA: Diagnosis not present

## 2023-03-07 DIAGNOSIS — M542 Cervicalgia: Secondary | ICD-10-CM | POA: Diagnosis not present

## 2023-03-07 DIAGNOSIS — R202 Paresthesia of skin: Secondary | ICD-10-CM | POA: Diagnosis not present

## 2023-03-07 NOTE — Progress Notes (Signed)
[facility]  PATIENT: [name] DOB: March 29, 1951  REFERRING CLINICIAN: Linwood Dibbles, MD HISTORY FROM: patient and son REASON FOR VISIT: new consult   HISTORICAL  CHIEF COMPLAINT:  Chief Complaint  Patient presents with   New Patient (Initial Visit)    Pt with son, rm 7. Referred from ER visit. She would get up and start walking and the moment she would being to walk legs and arms were shaking. They she would freeze up. This has been ongoing for a period of time. She was in the ER in feb 2024 and that is when her MRI was completed.   Other    Dec 2023 she had a extensive surgery completed. She states that there is word finding problems. She has been in the last several years having increase in falls.    HISTORY OF PRESENT ILLNESS:   72 year old female here for evaluation of left-sided tremors.  December 2023 patient had left total hip replacement which was a prolonged complex surgery with significant blood loss lasting over 10 hours.    Also has history of cervical spine surgeries in the past.  Since 2021 patient having intermittent episodes of left leg and left arm numbness and tremors, lasting 1 hour up to 24 hours at a time, 1-2 times per week.  Symptoms seem to be worse when she is standing up.  No confusion or loss of consciousness.    REVIEW OF SYSTEMS: Full 14 system review of systems performed and negative with exception of: as per HPI.  ALLERGIES: Allergies  Allergen Reactions   Amoxicillin-Pot Clavulanate Nausea And Vomiting and Other (See Comments)   Clarithromycin Other (See Comments)    DOES NOT REMEMBER REACTION DOES NOT REMEMBER REACTION    Fentanyl Nausea And Vomiting and Other (See Comments)    FENTANYL PATCH FENTANYL PATCH FENTANYL PATCH    Levofloxacin Swelling    Face,eye swelling Face,eye swelling    Lorazepam Other (See Comments)    DO NOT PUT ANY ATIVAN ON PATIENT'S IV POST SURGERY! DO NOT PUT ANY ATIVAN ON PATIENT'S IV  POST SURGERY!    Amoxicillin    Doxycycline Nausea And Vomiting   Penicillins Hives   Sulfa Antibiotics Other (See Comments)    Per pt: unknown    HOME MEDICATIONS: Outpatient Medications Prior to Visit  Medication Sig Dispense Refill   buPROPion HCl ER, XL, 450 MG TB24 TAKE ONE TABLET BY MOUTH EVERY DAY 30 tablet 5   cyclobenzaprine (FLEXERIL) 5 MG tablet TAKE ONE TABLET ONCE DAILY AS NEEDED 30 tablet 5   escitalopram (LEXAPRO) 20 MG tablet TAKE ONE TABLET BY MOUTH ONCE DAILY 90 tablet 0   HYDROcodone-acetaminophen (NORCO/VICODIN) 5-325 MG tablet Take 1 tablet by mouth every 8 (eight) hours as needed for moderate pain. 90 tablet 0   levothyroxine (SYNTHROID) 125 MCG tablet Take 1 tablet (125 mcg total) by mouth daily. 90 tablet 1   methocarbamol (ROBAXIN) 500 MG tablet TAKE 1 OR 2 TABLETS AS NEEDED FOR MUSCLE SPASM 30 tablet 1   promethazine (PHENERGAN) 12.5 MG suppository Place 1 suppository (12.5 mg total) rectally every 6 (six) hours as needed for nausea or vomiting. 12 each 1   RITALIN 5 MG tablet TAKE ONE TABLET BY MOUTH DAILY AS NEEDED 30 tablet 0   RITALIN LA 20 MG 24 hr capsule TAKE ONE CAPSULE EVERY DAY 30 capsule 0   TOPAMAX 100 MG tablet Take 1 tablet (100 mg total) by mouth 2 (two) times daily. (Patient taking  differently: Take 100 mg by mouth as needed.) 90 tablet 1   azithromycin (ZITHROMAX) 250 MG tablet Take 2 tablets (500 mg ) by mouth x 1 dose then one tablet ( 250 mg ) by mouth daily x 4 days 6 tablet 0   No facility-administered medications prior to visit.    PAST MEDICAL HISTORY: Past Medical History:  Diagnosis Date   ADHD    Anemia    Cellulitis 07/2018   left lower extremity   Chronic kidney disease    surgery to fix   Chronic pain    Depression    major depression   DVT (deep venous thrombosis) (HCC)    Ekbom's delusional parasitosis (HCC)    Fibromyalgia    History of Zenker's diverticulum removal    Lyme disease    Migraine with aura     Multiple falls    Thyroid disease    hypothyroid    PAST SURGICAL HISTORY: Past Surgical History:  Procedure Laterality Date   APPENDECTOMY  1974   Avascular necrosis of the hip     multiple operations starting in 2005.  bilateral hips   Back fusion  1999   CERVICAL FUSION  [id number],[id number]   EYE SURGERY     eye muscle   HIP SURGERY Left 06/2022   KIDNEY SURGERY  1971   kidney reconstruction   LAMINECTOMY  1985   TOTAL HIP ARTHROPLASTY      FAMILY HISTORY: Family History  Problem Relation Age of Onset   Cancer Mother        cancr of unknown origin   Cancer Sister    Hemochromatosis Sister    Colon cancer Neg Hx    Stomach cancer Neg Hx    Esophageal cancer Neg Hx     SOCIAL HISTORY: Social History   Socioeconomic History   Marital status: Legally Separated    Spouse name: Not on file   Number of children: Not on file   Years of education: Not on file   Highest education level: Not on file  Occupational History   Not on file  Tobacco Use   Smoking status: Never   Smokeless tobacco: Never  Vaping Use   Vaping status: Never Used  Substance and Sexual Activity   Alcohol use: Not Currently    Comment: rarely   Drug use: No   Sexual activity: Not Currently    Birth control/protection: Post-menopausal  Other Topics Concern   Not on file  Social History Narrative   Clinical Psychologist--Dennis McKnight---[phone]      Tobacco use, amount per day now: No   Past tobacco use, amount per day: N/A   How many years did you use tobacco: Never    Alcohol use (drinks per week): Very rarely   Diet: Very health eating   Do you drink/eat things with caffeine: Very rarely   Marital status: Seperated                                 What year were you married? 1991   Do you live in a house, apartment, assisted living, condo, trailer, etc.? House   Is it one or more stories? 2   How many persons live in your home? 3 ( including me )   Do you have pets in your home?(  please list)   Highest Level of education completed? Advanced Degree   Current or past profession:  Many   Do you exercise? Not currently due to injury.                                 Type and how often?   Do you have a living will? No.   Do you have a DNR form? No.                                  If not, do you want to discuss one?   Do you have signed POA/HPOA forms? No.                       If so, please bring to you appointment      Do you have any difficulty bathing or dressing yourself? No.   Do you have any difficulty preparing food or eating? No.   Do you have any difficulty managing your medications? No.   Do you have any difficulty managing your finances? No.   Do you have any difficulty affording your medications? No.   Social Determinants of Health   Financial Resource Strain: Not on file  Food Insecurity: Not on file  Transportation Needs: Not on file  Physical Activity: Not on file  Stress: Not on file  Social Connections: Unknown (09/30/2019)   Received from [facility]   Social Connections    Conversations with friends/family/neighbors per week: Not on file  Intimate Partner Violence: Not on file     PHYSICAL EXAM  GENERAL EXAM/CONSTITUTIONAL: Vitals:  Vitals:   03/07/23 1032  BP: 115/71  Pulse: 80  Weight: 127 lb (57.6 kg)  Height: 5\' 3"  (1.6 m)   Body mass index is 22.5 kg/m. Wt Readings from Last 3 Encounters:  03/07/23 127 lb (57.6 kg)  02/18/23 127 lb (57.6 kg)  02/02/23 125 lb 3.2 oz (56.8 kg)   Patient is in no distress; well developed, nourished and groomed; DECR ROM IN NECK  CARDIOVASCULAR: Examination of carotid arteries is normal; no carotid bruits Regular rate and rhythm, no murmurs Examination of peripheral vascular system by observation and palpation is normal  EYES: Ophthalmoscopic exam of optic discs and posterior segments is normal; no papilledema or hemorrhages No results  found.  MUSCULOSKELETAL: Gait, strength, tone, movements noted in Neurologic exam below  NEUROLOGIC: MENTAL STATUS:      No data to display         awake, alert, oriented to person, place and time recent and remote memory intact normal attention and concentration language fluent, comprehension intact, naming intact fund of knowledge appropriate  CRANIAL NERVE:  2nd - no papilledema on fundoscopic exam 2nd, 3rd, 4th, 6th - pupils equal and reactive to light, visual fields full to confrontation, extraocular muscles intact, no nystagmus 5th - facial sensation symmetric 7th - facial strength symmetric 8th - hearing intact 9th - palate elevates symmetrically, uvula midline 11th - shoulder shrug symmetric 12th - tongue protrusion midline  MOTOR:  normal bulk and tone, full strength in the BUE, BLE  SENSORY:  normal and symmetric to light touch, temperature, vibration; EXCEPT SLIGHTLY DECR IN LEFT FOOT  COORDINATION:  finger-nose-finger, fine finger movements normal  REFLEXES:  deep tendon reflexes 1+ and symmetric  GAIT/STATION:  UNSTEADY; LIMITED BY LEFT HIP POST-OP CHANGES     DIAGNOSTIC DATA (LABS, IMAGING, TESTING) -  I reviewed patient records, labs, notes, testing and imaging myself where available.  Lab Results  Component Value Date   WBC 9.3 01/10/2023   HGB 13.2 01/10/2023   HCT 40.4 01/10/2023   MCV 88.6 01/10/2023   PLT 310 01/10/2023      Component Value Date/Time   NA 143 01/10/2023 1612   K 4.4 01/10/2023 1612   CL 107 01/10/2023 1612   CO2 27 01/10/2023 1612   GLUCOSE 102 (H) 01/10/2023 1612   BUN 16 01/10/2023 1612   CREATININE 0.85 01/10/2023 1612   CALCIUM 9.6 01/10/2023 1612   PROT 6.7 01/10/2023 1612   ALBUMIN 3.3 (L) 08/29/2022 1513   AST 15 01/10/2023 1612   ALT 14 01/10/2023 1612   ALKPHOS 97 08/29/2022 1513   BILITOT 0.5 01/10/2023 1612   GFRNONAA >60 08/29/2022 1513   GFRNONAA 68 12/24/2020 1019   GFRAA 79 12/24/2020 1019    Lab Results  Component Value Date   CHOL 164 11/17/2020   HDL 78 11/17/2020   LDLCALC 72 11/17/2020   TRIG 49 11/17/2020   CHOLHDL 2.1 11/17/2020   Lab Results  Component Value Date   HGBA1C 6.0 (H) 12/02/2022   No results found for: "VITAMINB12" Lab Results  Component Value Date   TSH 0.05 (L) 11/15/2022    08/29/22 MRI brain [I reviewed images myself and agree with interpretation. -VRP]  1. Technically limited exam due to the patient's inability to tolerate the full length of the study and motion. 2. No acute intracranial infarct or other abnormality. 3. Mild chronic microvascular ischemic disease. 4. Chronic left frontoethmoidal and maxillary sinusitis.    ASSESSMENT AND PLAN  72 y.o. year old female here with:   Dx:  1. Left-sided weakness   2. Neck pain   3. Numbness and tingling in left arm     PLAN:  INTERMITTENT LEFT ARM / LEFT LEG TREMORS, NUMBNESS, WEAKNESS - check MRI cervical spine (rule out spinal stenosis / myelopathy, radiculopathy) - continue pain mgmt per PCP  Orders Placed This Encounter  Procedures   MR CERVICAL SPINE W WO CONTRAST   Return for pending if symptoms worsen or fail to improve, pending test results.    Suanne Marker, MD 03/07/2023, 11:41 AM Certified in Neurology, Neurophysiology and Neuroimaging  [facility] [address], [address] [place], [place] [address] [phone]

## 2023-03-08 ENCOUNTER — Telehealth: Payer: Self-pay | Admitting: Diagnostic Neuroimaging

## 2023-03-08 NOTE — Telephone Encounter (Signed)
medicare/BCBS sup NPR sent to GI [phone] 

## 2023-03-28 ENCOUNTER — Other Ambulatory Visit: Payer: Self-pay | Admitting: Family

## 2023-03-28 DIAGNOSIS — F331 Major depressive disorder, recurrent, moderate: Secondary | ICD-10-CM

## 2023-04-04 ENCOUNTER — Other Ambulatory Visit: Payer: Self-pay | Admitting: Family

## 2023-04-04 DIAGNOSIS — F909 Attention-deficit hyperactivity disorder, unspecified type: Secondary | ICD-10-CM

## 2023-04-05 ENCOUNTER — Telehealth: Payer: Self-pay

## 2023-04-05 DIAGNOSIS — F909 Attention-deficit hyperactivity disorder, unspecified type: Secondary | ICD-10-CM

## 2023-04-05 NOTE — Telephone Encounter (Signed)
Incoming fax received via [employer] for Ritalin LA 20 mg capsule and Ritalin 5 mg tablet .  Patient is requesting a refill of the following medications: Requested Prescriptions   Pending Prescriptions Disp Refills   RITALIN LA 20 MG 24 hr capsule 30 capsule 0    Sig: TAKE ONE CAPSULE EVERY DAY   methylphenidate (RITALIN) 5 MG tablet 30 tablet 0    Sig: Take 1 tablet (5 mg total) by mouth daily as needed.    Date of last refill: 20 mg, 02/18/23, 5 mg 01/24/23  Refill amount: 30 for both   Treatment agreement date: Not on file, noted on pending appointment for October 2024

## 2023-04-06 MED ORDER — RITALIN LA 20 MG PO CP24
ORAL_CAPSULE | ORAL | 0 refills | Status: DC
Start: 2023-04-06 — End: 2023-05-11

## 2023-04-06 MED ORDER — METHYLPHENIDATE HCL 5 MG PO TABS: 5.0000 mg | ORAL_TABLET | Freq: Every day | ORAL | 0 refills | Status: DC | PRN

## 2023-04-06 NOTE — Telephone Encounter (Signed)
DUPLICATE Request, however we are unable to deny. I will forward to [place] K. Janyth Contes, NP

## 2023-04-20 ENCOUNTER — Telehealth: Payer: Self-pay | Admitting: Diagnostic Neuroimaging

## 2023-04-20 NOTE — Telephone Encounter (Signed)
Pt asked to be called to discuss pain all over top of her head, pt states she is unable to sleep, she failed to mention to Dr Marjory Lies that she has an infectious disease called Helmiths and she believes it is worsening, pt asking to be called.

## 2023-04-21 NOTE — Telephone Encounter (Signed)
Contacted pt back, LVM rq call back

## 2023-04-23 ENCOUNTER — Other Ambulatory Visit: Payer: Self-pay | Admitting: Family

## 2023-04-23 DIAGNOSIS — G8929 Other chronic pain: Secondary | ICD-10-CM

## 2023-04-25 NOTE — Telephone Encounter (Signed)
Pt returned call, she stated she was having a pain on top of the L side of her head. She took lexapro and the pain goes away. She asked if MRI appt could be moved to a sooner date. I advised pt she would have to discuss with GI as we do not schedule the imaging there. Advised she rq to be placed on wait list. Will send to MD as FYI.

## 2023-05-09 ENCOUNTER — Other Ambulatory Visit: Payer: Self-pay | Admitting: Nurse Practitioner

## 2023-05-11 ENCOUNTER — Other Ambulatory Visit: Payer: Self-pay | Admitting: Family

## 2023-05-11 DIAGNOSIS — F909 Attention-deficit hyperactivity disorder, unspecified type: Secondary | ICD-10-CM

## 2023-05-11 NOTE — Addendum Note (Signed)
Addended by: Maurice Small on: 05/11/2023 09:11 AM   Modules accepted: Orders

## 2023-05-11 NOTE — Telephone Encounter (Signed)
Patient [name] requesting refill of Ritalin 5 mg tablet and Ritalin 20 mg capsule. She called the pharmacy first but they told her that [facility] needed to refill the meds.

## 2023-05-11 NOTE — Telephone Encounter (Signed)
Patient is requesting a refill of the following medications: Requested Prescriptions   Pending Prescriptions Disp Refills   RITALIN LA 20 MG 24 hr capsule 30 capsule 0    Sig: TAKE ONE CAPSULE EVERY DAY    Date of last refill: 04/06/2023  Refill amount: 30  Treatment agreement date: Not on file for this medication, however it's noted on pending appointment (05/18/23) to sign treatment agreement.

## 2023-05-12 MED ORDER — RITALIN LA 20 MG PO CP24: ORAL_CAPSULE | ORAL | 0 refills | Status: DC

## 2023-05-12 NOTE — Addendum Note (Signed)
Addended byRicharda Blade C on: 05/12/2023 01:03 PM   Modules accepted: Orders

## 2023-05-17 ENCOUNTER — Ambulatory Visit: Payer: Medicare Other | Admitting: Family

## 2023-05-18 ENCOUNTER — Ambulatory Visit: Payer: Medicare Other | Admitting: Family

## 2023-05-24 ENCOUNTER — Ambulatory Visit (INDEPENDENT_AMBULATORY_CARE_PROVIDER_SITE_OTHER): Payer: Medicare Other | Admitting: Family

## 2023-05-24 ENCOUNTER — Encounter: Payer: Self-pay | Admitting: Family

## 2023-05-24 VITALS — BP 124/74 | HR 88 | Resp 20 | Ht 63.0 in | Wt 125.4 lb

## 2023-05-24 DIAGNOSIS — K219 Gastro-esophageal reflux disease without esophagitis: Secondary | ICD-10-CM | POA: Diagnosis not present

## 2023-05-24 DIAGNOSIS — R7303 Prediabetes: Secondary | ICD-10-CM | POA: Diagnosis not present

## 2023-05-24 DIAGNOSIS — Z13220 Encounter for screening for lipoid disorders: Secondary | ICD-10-CM

## 2023-05-24 DIAGNOSIS — F331 Major depressive disorder, recurrent, moderate: Secondary | ICD-10-CM | POA: Diagnosis not present

## 2023-05-24 DIAGNOSIS — E039 Hypothyroidism, unspecified: Secondary | ICD-10-CM

## 2023-05-24 DIAGNOSIS — F909 Attention-deficit hyperactivity disorder, unspecified type: Secondary | ICD-10-CM

## 2023-05-24 DIAGNOSIS — Z5181 Encounter for therapeutic drug level monitoring: Secondary | ICD-10-CM

## 2023-05-24 DIAGNOSIS — F22 Delusional disorders: Secondary | ICD-10-CM

## 2023-05-24 NOTE — Progress Notes (Signed)
Provider: Richarda Blade FNP-C   Gavino Fouch, Donalee Citrin, NP  Patient Care Team: Saylor Sheckler, Donalee Citrin, NP as PCP - General (Family Medicine)  Extended Emergency Contact Information Primary Emergency Contact: Juanda Chance [place] Mobile Phone: [phone] Relation: Son Secondary Emergency Contact: Pichard,Christian Mobile Phone: [phone] Relation: Son  Code Status:  Full Code  Goals of care: Advanced Directive information    01/26/2023    3:07 PM  Advanced Directives  Does Patient Have a Medical Advance Directive? No  Would patient like information on creating a medical advance directive? No - Patient declined     Chief Complaint  Patient presents with   Medical Management of Chronic Issues    Patient presents today for a 6 month follow-up   Quality Metric Gaps    Mammogram, colonoscopy, Hep C screening, zoster, TDAP, COVID#3, flu    HPI:  Pt is a 72 y.o. female seen today for 6 months follow up for medical management of chronic diseases.   States feeling overwhelmed going through divorce with the Husband. Also states was on Lexapro 40 mg tablet daily prior to being seeing here.would like lexapro increased from 20 mg tablet to 40 mg tablet.Discussed with patient max dose is 20 mg tablet.she wonders if she confused with her Ritalin to be increased to 40 mg currently on total of 25 mg.Recommend referral to psychiatry to manage ADHD and depression due to need to increase medication. She declines referral to psychiatry states has a counsel that has been working with her.   She complains of pain on the arms,legs and lower back pain.Request refill on Norco. States has follow up with orthopedic for ankle surgery that turns outward when she walks since she had her major surgery on her hip done in 2022.   Showed pictures of her scalp states had a bug crawl out of her.Also believes had some worms that under her skin that went from her wrist area to the armpit.   Past  Medical History:  Diagnosis Date   ADHD    Anemia    Cellulitis 07/2018   left lower extremity   Chronic kidney disease    surgery to fix   Chronic pain    Depression    major depression   DVT (deep venous thrombosis) (HCC)    Ekbom's delusional parasitosis (HCC)    Fibromyalgia    History of Zenker's diverticulum removal    Lyme disease    Migraine with aura    Multiple falls    Thyroid disease    hypothyroid   Past Surgical History:  Procedure Laterality Date   APPENDECTOMY  1974   Avascular necrosis of the hip     multiple operations starting in 2005.  bilateral hips   Back fusion  1999   CERVICAL FUSION  2956,2130   EYE SURGERY     eye muscle   HIP SURGERY Left 06/2022   KIDNEY SURGERY  1971   kidney reconstruction   LAMINECTOMY  1985   TOTAL HIP ARTHROPLASTY      Allergies  Allergen Reactions   Amoxicillin-Pot Clavulanate Nausea And Vomiting and Other (See Comments)   Clarithromycin Other (See Comments)    DOES NOT REMEMBER REACTION DOES NOT REMEMBER REACTION    Fentanyl Nausea And Vomiting and Other (See Comments)    FENTANYL PATCH FENTANYL PATCH FENTANYL PATCH    Levofloxacin Swelling    Face,eye swelling Face,eye swelling    Lorazepam Other (See Comments)    DO  NOT PUT ANY ATIVAN ON PATIENT'S IV POST SURGERY! DO NOT PUT ANY ATIVAN ON PATIENT'S IV POST SURGERY!    Amoxicillin    Doxycycline Nausea And Vomiting   Penicillins Hives   Sulfa Antibiotics Other (See Comments)    Per pt: unknown    Allergies as of 05/24/2023       Reactions   Amoxicillin-pot Clavulanate Nausea And Vomiting, Other (See Comments)   Clarithromycin Other (See Comments)   DOES NOT REMEMBER REACTION DOES NOT REMEMBER REACTION   Fentanyl Nausea And Vomiting, Other (See Comments)   FENTANYL PATCH FENTANYL PATCH FENTANYL PATCH   Levofloxacin Swelling   Face,eye swelling Face,eye swelling   Lorazepam Other (See Comments)   DO NOT PUT ANY ATIVAN ON PATIENT'S IV POST  SURGERY! DO NOT PUT ANY ATIVAN ON PATIENT'S IV POST SURGERY!   Amoxicillin    Doxycycline Nausea And Vomiting   Penicillins Hives   Sulfa Antibiotics Other (See Comments)   Per pt: unknown        Medication List        Accurate as of May 24, 2023  4:07 PM. If you have any questions, ask your nurse or doctor.          buPROPion HCl ER (XL) 450 MG Tb24 TAKE ONE TABLET BY MOUTH EVERY DAY   cyclobenzaprine 5 MG tablet Commonly known as: FLEXERIL TAKE ONE TABLET ONCE DAILY AS NEEDED   escitalopram 20 MG tablet Commonly known as: LEXAPRO TAKE ONE TABLET BY MOUTH ONCE DAILY   HYDROcodone-acetaminophen 5-325 MG tablet Commonly known as: NORCO/VICODIN Take 1 tablet by mouth every 8 (eight) hours as needed for moderate pain.   levothyroxine 125 MCG tablet Commonly known as: SYNTHROID Take 1 tablet (125 mcg total) by mouth daily.   methocarbamol 500 MG tablet Commonly known as: ROBAXIN TAKE 1 OR 2 TABLETS AS NEEDED FOR MUSCLE SPASM   promethazine 12.5 MG suppository Commonly known as: PHENERGAN Place 1 suppository (12.5 mg total) rectally every 6 (six) hours as needed for nausea or vomiting.   Ritalin 5 MG tablet Generic drug: methylphenidate TAKE ONE TABLET BY MOUTH DAILY AS NEEDED   Ritalin LA 20 MG 24 hr capsule Generic drug: methylphenidate TAKE ONE CAPSULE EVERY DAY   Topamax 100 MG tablet Generic drug: topiramate Take 1 tablet (100 mg total) by mouth 2 (two) times daily. What changed:  when to take this reasons to take this        Review of Systems  Constitutional:  Negative for appetite change, chills, fatigue, fever and unexpected weight change.  HENT:  Negative for congestion, dental problem, ear discharge, ear pain, facial swelling, hearing loss, nosebleeds, postnasal drip, rhinorrhea, sinus pressure, sinus pain, sneezing, sore throat, tinnitus and trouble swallowing.   Eyes:  Negative for pain, discharge, redness, itching and visual  disturbance.  Respiratory:  Negative for cough, chest tightness, shortness of breath and wheezing.   Cardiovascular:  Negative for chest pain, palpitations and leg swelling.  Gastrointestinal:  Negative for abdominal distention, abdominal pain, blood in stool, constipation, diarrhea, nausea and vomiting.  Endocrine: Negative for cold intolerance, heat intolerance, polydipsia, polyphagia and polyuria.  Genitourinary:  Negative for difficulty urinating, dysuria, flank pain, frequency and urgency.  Musculoskeletal:  Positive for arthralgias, back pain and gait problem. Negative for joint swelling, myalgias, neck pain and neck stiffness.  Skin:  Negative for color change, pallor, rash and wound.  Neurological:  Negative for dizziness, syncope, speech difficulty, weakness, light-headedness, numbness and headaches.  Hematological:  Does not bruise/bleed easily.  Psychiatric/Behavioral:  Positive for decreased concentration. Negative for agitation, behavioral problems, confusion, hallucinations, self-injury, sleep disturbance and suicidal ideas. The patient is nervous/anxious.     Immunization History  Administered Date(s) Administered   Influenza, High Dose Seasonal PF 06/09/2018, 09/07/2019   Influenza-Unspecified 09/07/2019   PFIZER(Purple Top)SARS-COV-2 Vaccination 11/19/2019, 02/12/2020   Pneumococcal Conjugate-13 08/22/2015   Pneumococcal Polysaccharide-23 11/10/2020   Pertinent  Health Maintenance Due  Topic Date Due   MAMMOGRAM  09/08/2013   Colonoscopy  12/01/2021   INFLUENZA VACCINE  02/17/2023   DEXA SCAN  Completed      08/04/2021    3:59 PM 03/27/2022   10:36 PM 09/06/2022    2:13 PM 01/10/2023    3:36 PM 01/26/2023    3:29 PM  Fall Risk  Falls in the past year? 0  1 0 1  Was there an injury with Fall? 0  0 0 1  Fall Risk Category Calculator 0  1 0 3  Fall Risk Category (Retired) Low      (RETIRED) Patient Fall Risk Level Low fall risk Low fall risk     Patient at Risk for  Falls Due to No Fall Risks  History of fall(s);Impaired balance/gait;Impaired mobility No Fall Risks No Fall Risks  Fall risk Follow up Falls evaluation completed  Falls evaluation completed;Education provided;Falls prevention discussed Falls evaluation completed Falls evaluation completed   Functional Status Survey:    Vitals:   05/24/23 1543  BP: 124/74  Pulse: 88  Resp: 20  SpO2: 98%  Weight: 125 lb 6.4 oz (56.9 kg)  Height: 5\' 3"  (1.6 m)   Body mass index is 22.21 kg/m. Physical Exam Vitals reviewed.  Constitutional:      General: She is not in acute distress.    Appearance: Normal appearance. She is normal weight. She is not ill-appearing or diaphoretic.  HENT:     Head: Normocephalic.     Right Ear: Tympanic membrane, ear canal and external ear normal. There is no impacted cerumen.     Left Ear: Tympanic membrane, ear canal and external ear normal. There is no impacted cerumen.     Nose: Nose normal. No congestion or rhinorrhea.     Mouth/Throat:     Mouth: Mucous membranes are moist.     Pharynx: Oropharynx is clear. No oropharyngeal exudate or posterior oropharyngeal erythema.  Eyes:     General: No scleral icterus.       Right eye: No discharge.        Left eye: No discharge.     Extraocular Movements: Extraocular movements intact.     Conjunctiva/sclera: Conjunctivae normal.     Pupils: Pupils are equal, round, and reactive to light.  Neck:     Vascular: No carotid bruit.  Cardiovascular:     Rate and Rhythm: Normal rate and regular rhythm.     Pulses: Normal pulses.     Heart sounds: Normal heart sounds. No murmur heard.    No friction rub. No gallop.  Pulmonary:     Effort: Pulmonary effort is normal. No respiratory distress.     Breath sounds: Normal breath sounds. No wheezing, rhonchi or rales.  Chest:     Chest wall: No tenderness.  Abdominal:     General: Bowel sounds are normal. There is no distension.     Palpations: Abdomen is soft. There is no  mass.     Tenderness: There is no abdominal tenderness. There is no  right CVA tenderness, left CVA tenderness, guarding or rebound.  Musculoskeletal:        General: No swelling or tenderness. Normal range of motion.     Cervical back: Normal range of motion. No rigidity or tenderness.     Right lower leg: No edema.     Left lower leg: No edema.     Comments: Left foot externally rotated when walking  Lymphadenopathy:     Cervical: No cervical adenopathy.  Skin:    General: Skin is warm and dry.     Coloration: Skin is not pale.     Findings: No bruising, erythema, lesion or rash.  Neurological:     Mental Status: She is alert and oriented to person, place, and time.     Cranial Nerves: No cranial nerve deficit.     Sensory: No sensory deficit.     Motor: No weakness.     Coordination: Coordination normal.     Gait: Gait abnormal.  Psychiatric:        Mood and Affect: Mood normal.        Speech: Speech normal.        Behavior: Behavior normal.        Thought Content: Thought content normal.        Judgment: Judgment normal.     Labs reviewed: Recent Labs    08/29/22 1513 11/15/22 1457 01/10/23 1612  NA 140 138 143  K 3.8 4.1 4.4  CL 110 109 107  CO2 20* 23 27  GLUCOSE 86 107* 102*  BUN 20 21 16   CREATININE 0.97 0.97 0.85  CALCIUM 9.0 9.3 9.6   Recent Labs    08/29/22 1513 11/15/22 1457 01/10/23 1612  AST 19 13 15   ALT 12 12 14   ALKPHOS 97  --   --   BILITOT 0.5 0.3 0.5  PROT 6.4* 6.5 6.7  ALBUMIN 3.3*  --   --    Recent Labs    08/29/22 1513 11/15/22 1457 01/10/23 1612  WBC 4.9 6.4 9.3  NEUTROABS 3.2 4,429 7,096  HGB 11.4* 12.7 13.2  HCT 36.1 39.2 40.4  MCV 90.5 86.2 88.6  PLT 289 309 310   Lab Results  Component Value Date   TSH 0.05 (L) 11/15/2022   Lab Results  Component Value Date   HGBA1C 6.0 (H) 12/02/2022   Lab Results  Component Value Date   CHOL 164 11/17/2020   HDL 78 11/17/2020   LDLCALC 72 11/17/2020   TRIG 49 11/17/2020    CHOLHDL 2.1 11/17/2020    Significant Diagnostic Results in last 30 days:  No results found.  Assessment/Plan 1. Attention deficit hyperactivity disorder (ADHD), unspecified ADHD type - continue on Ritalin  - Recommend refer to psychiatrist for further evaluation but declines.request higher doses of Ritalin  - continue to follow up with counselor    2. Prediabetes Lab Results  Component Value Date   HGBA1C 6.0 (H) 12/02/2022  - dietary modification and exercise advised  - Hemoglobin A1c  3. Moderate episode of recurrent major depressive disorder (HCC) Symptoms have worsen due to divorce with Husband  - continue on escitalopram and Bupropion   - declines refer to psychiatrist. - continue to follow up with counselor   - continue to monitor mood   4. Gastroesophageal reflux disease, unspecified whether esophagitis present Symptoms controlled. H/H stable.No tarry or black stool  - advised to avoid eating meals late in the evening and to avoid aggravating foods and spices. -  COMPLETE METABOLIC PANEL WITH GFR - CBC with Differential/Platelet  5. Acquired hypothyroidism Lab Results  Component Value Date   TSH 0.05 (L) 11/15/2022  - continue on levothyroxine  - TSH  6. Encounter for therapeutic drug monitoring Continues to require Ritalin for ADHD - DRUG MONITORING, PANEL 6 WITH CONFIRMATION, URINE  7. Screening for hyperlipidemia Dietary modification and exercise advised  - Lipid panel  8. Ekbom's delusional parasitosis (HCC) Recommend referral to psychiatrist   Family/ staff Communication: Reviewed plan of care with patient verbalized   Labs/tests ordered:  - COMPLETE METABOLIC PANEL WITH GFR - CBC with Differential/Platelet - TSH - Lipid panel - Hemoglobin A1c - DRUG MONITORING, PANEL 6 WITH CONFIRMATION, URINE  Next Appointment : Return in about 6 months (around 11/21/2023) for medical mangement of chronic issues.Caesar Bookman, NP

## 2023-05-25 ENCOUNTER — Other Ambulatory Visit: Payer: Self-pay | Admitting: Family

## 2023-05-25 ENCOUNTER — Telehealth: Payer: Self-pay

## 2023-05-25 LAB — COMPLETE METABOLIC PANEL WITH GFR
AG Ratio: 1.6 (calc) (ref 1.0–2.5)
ALT: 9 U/L (ref 6–29)
AST: 14 U/L (ref 10–35)
Albumin: 4.4 g/dL (ref 3.6–5.1)
Alkaline phosphatase (APISO): 97 U/L (ref 37–153)
BUN: 17 mg/dL (ref 7–25)
CO2: 23 mmol/L (ref 20–32)
Calcium: 9.7 mg/dL (ref 8.6–10.4)
Chloride: 111 mmol/L — ABNORMAL HIGH (ref 98–110)
Creat: 0.9 mg/dL (ref 0.60–1.00)
Globulin: 2.7 g/dL (ref 1.9–3.7)
Glucose, Bld: 95 mg/dL (ref 65–139)
Potassium: 4 mmol/L (ref 3.5–5.3)
Sodium: 142 mmol/L (ref 135–146)
Total Bilirubin: 0.3 mg/dL (ref 0.2–1.2)
Total Protein: 7.1 g/dL (ref 6.1–8.1)
eGFR: 67 mL/min/{1.73_m2} (ref >=60)

## 2023-05-25 LAB — CBC WITH DIFFERENTIAL/PLATELET
Absolute Lymphocytes: 1363 {cells}/uL (ref 850–3900)
Absolute Monocytes: 614 {cells}/uL (ref 200–950)
Basophils Absolute: 58 {cells}/uL (ref 0–200)
Basophils Relative: 0.9 %
Eosinophils Absolute: 192 {cells}/uL (ref 15–500)
Eosinophils Relative: 3 %
HCT: 43.6 % (ref 35.0–45.0)
Hemoglobin: 14.3 g/dL (ref 11.7–15.5)
MCH: 29.1 pg (ref 27.0–33.0)
MCHC: 32.8 g/dL (ref 32.0–36.0)
MCV: 88.8 fL (ref 80.0–100.0)
MPV: 10.1 fL (ref 7.5–12.5)
Monocytes Relative: 9.6 %
Neutro Abs: 4173 {cells}/uL (ref 1500–7800)
Neutrophils Relative %: 65.2 %
Platelets: 304 10*3/uL (ref 140–400)
RBC: 4.91 10*6/uL (ref 3.80–5.10)
RDW: 13.6 % (ref 11.0–15.0)
Total Lymphocyte: 21.3 %
WBC: 6.4 10*3/uL (ref 3.8–10.8)

## 2023-05-25 LAB — LIPID PANEL
Cholesterol: 156 mg/dL (ref <200)
HDL: 64 mg/dL (ref >=50)
LDL Cholesterol (Calc): 77 mg/dL
Non-HDL Cholesterol (Calc): 92 mg/dL (ref <130)
Total CHOL/HDL Ratio: 2.4 (calc) (ref <5.0)
Triglycerides: 70 mg/dL (ref <150)

## 2023-05-25 LAB — HEMOGLOBIN A1C
Hgb A1c MFr Bld: 5.9 %{Hb} — ABNORMAL HIGH (ref <5.7)
Mean Plasma Glucose: 123 mg/dL
eAG (mmol/L): 6.8 mmol/L

## 2023-05-25 LAB — TSH: TSH: 0.01 m[IU]/L — ABNORMAL LOW (ref 0.40–4.50)

## 2023-05-25 MED ORDER — HYDROCODONE-ACETAMINOPHEN 5-325 MG PO TABS: 1.0000 | ORAL_TABLET | Freq: Three times a day (TID) | ORAL | 0 refills | Status: DC | PRN

## 2023-05-25 NOTE — Telephone Encounter (Signed)
Hydrocodone send to pharmacy.

## 2023-05-25 NOTE — Telephone Encounter (Signed)
Message left on clinical intake voicemail:   Patient states she was seen yesterday and a pain medication was suppose to be called in

## 2023-05-26 LAB — DRUG MONITORING, PANEL 6 WITH CONFIRMATION, URINE
6 Acetylmorphine: NEGATIVE ng/mL (ref <10)
Alcohol Metabolites: NEGATIVE ng/mL (ref <500)
Amphetamine: NEGATIVE ng/mL (ref <250)
Amphetamines: NEGATIVE ng/mL (ref <500)
Barbiturates: NEGATIVE ng/mL (ref <300)
Benzodiazepines: NEGATIVE ng/mL (ref <100)
Cocaine Metabolite: NEGATIVE ng/mL (ref <150)
Creatinine: 135.1 mg/dL (ref >=20.0)
Marijuana Metabolite: NEGATIVE ng/mL (ref <20)
Methadone Metabolite: NEGATIVE ng/mL (ref <100)
Methamphetamine: NEGATIVE ng/mL (ref <250)
Opiates: NEGATIVE ng/mL (ref <100)
Oxidant: NEGATIVE ug/mL (ref <200)
Oxycodone: NEGATIVE ng/mL (ref <100)
Phencyclidine: NEGATIVE ng/mL (ref <25)
pH: 5.2 (ref 4.5–9.0)

## 2023-05-26 LAB — DM TEMPLATE

## 2023-05-26 NOTE — Telephone Encounter (Signed)
Left patient a detailed message about rx being sent to the pharmacy

## 2023-05-29 ENCOUNTER — Ambulatory Visit
Admission: RE | Admit: 2023-05-29 | Discharge: 2023-05-29 | Disposition: A | Discharge status: Home / Self Care | Payer: Medicare Other | Source: Ambulatory Visit | Attending: Diagnostic Neuroimaging | Admitting: Diagnostic Neuroimaging

## 2023-05-29 DIAGNOSIS — R531 Weakness: Secondary | ICD-10-CM

## 2023-05-29 DIAGNOSIS — R200 Anesthesia of skin: Secondary | ICD-10-CM

## 2023-05-29 DIAGNOSIS — M542 Cervicalgia: Secondary | ICD-10-CM | POA: Diagnosis not present

## 2023-05-29 MED ORDER — GADOPICLENOL 0.5 MMOL/ML IV SOLN
5.0000 mL | Freq: Once | INTRAVENOUS | Status: AC | PRN
  Administered 2023-05-29: 5 mL via INTRAVENOUS

## 2023-05-31 ENCOUNTER — Other Ambulatory Visit: Payer: Self-pay

## 2023-05-31 ENCOUNTER — Other Ambulatory Visit: Payer: Self-pay | Admitting: Family

## 2023-05-31 DIAGNOSIS — E0590 Thyrotoxicosis, unspecified without thyrotoxic crisis or storm: Secondary | ICD-10-CM

## 2023-05-31 DIAGNOSIS — F331 Major depressive disorder, recurrent, moderate: Secondary | ICD-10-CM

## 2023-05-31 DIAGNOSIS — F909 Attention-deficit hyperactivity disorder, unspecified type: Secondary | ICD-10-CM

## 2023-06-03 ENCOUNTER — Telehealth: Payer: Self-pay

## 2023-06-03 NOTE — Telephone Encounter (Signed)
Patient called to say she is in horrible pain and needs something stronger called in. Patient confirmed that she picked up rx for hydrocodone, however it does not manage her pain.

## 2023-06-03 NOTE — Telephone Encounter (Signed)
Spoke with patient, patient states she went to patient management before and it doesn't make sense to go there again (declined recommendation). Patient stated oh well.

## 2023-06-03 NOTE — Telephone Encounter (Signed)
Recommend referral to pain management if hydrocodone is ineffective.

## 2023-06-10 ENCOUNTER — Other Ambulatory Visit: Payer: Self-pay | Admitting: Family

## 2023-06-10 ENCOUNTER — Other Ambulatory Visit: Payer: Medicare Other

## 2023-06-10 ENCOUNTER — Other Ambulatory Visit: Payer: Self-pay

## 2023-06-10 DIAGNOSIS — F909 Attention-deficit hyperactivity disorder, unspecified type: Secondary | ICD-10-CM

## 2023-06-10 DIAGNOSIS — F331 Major depressive disorder, recurrent, moderate: Secondary | ICD-10-CM

## 2023-06-11 LAB — T4, FREE: Free T4: 1.4 ng/dL (ref 0.8–1.8)

## 2023-06-11 LAB — T3, FREE: T3, Free: 3.6 pg/mL (ref 2.3–4.2)

## 2023-06-13 ENCOUNTER — Other Ambulatory Visit: Payer: Self-pay | Admitting: Family

## 2023-06-13 DIAGNOSIS — F909 Attention-deficit hyperactivity disorder, unspecified type: Secondary | ICD-10-CM

## 2023-06-13 MED ORDER — RITALIN LA 20 MG PO CP24
ORAL_CAPSULE | ORAL | Status: DC
Start: 2023-06-13 — End: 2023-06-30

## 2023-06-13 MED ORDER — METHYLPHENIDATE HCL 5 MG PO TABS: 5.0000 mg | ORAL_TABLET | Freq: Every day | ORAL | Status: DC | PRN

## 2023-06-28 ENCOUNTER — Telehealth: Payer: Self-pay

## 2023-06-28 ENCOUNTER — Other Ambulatory Visit: Payer: Self-pay | Admitting: Family

## 2023-06-28 NOTE — Telephone Encounter (Signed)
Patient was advised to follow up with Psychiatrist PCP will no longer prescribe medication.Drug test was negative for prescribed medication.

## 2023-06-28 NOTE — Telephone Encounter (Signed)
Message left on clinical intake voicemail by pharmacist:    Patient was dispensed only 15 pills of the Brand Name Ritalin at her last refill due to low inventory. The pharmacy is unable to get the rest of her supply for Brand name only and called the patient to see if she is willing to try the generic formulation again, and she agreed.  A new rx is needed for the generic   See note sent to pharmacy on 06/13/23: Note to Pharmacy: Do NOT Prescribe at [facility] to be managed by Psychiatrist.Urine drug test was negative for prescribed medication  Indications of Use: Attention Deficit Hyperactivity Disorder    Please advise

## 2023-06-29 ENCOUNTER — Other Ambulatory Visit: Payer: Self-pay | Admitting: Family

## 2023-06-29 ENCOUNTER — Telehealth: Payer: Self-pay

## 2023-06-29 DIAGNOSIS — E039 Hypothyroidism, unspecified: Secondary | ICD-10-CM

## 2023-06-29 NOTE — Telephone Encounter (Signed)
Documentation in place as stated.No further prescription will be prescribed for now.

## 2023-06-29 NOTE — Telephone Encounter (Signed)
Patient called to express her frustration about Ritalin not being approved for the remainder of her supply. Patient states she was never told that we were referring her to psych although she is open to doing so. Patient states she was never told about UDS results from November 2024, despite our documentation that states she was informed.   Patient scheduled an appointment for next Wednesday (only wants to see Dinah) to Discuss UDS. Patient is asking for the remanding supply of her Ritlan in the generic form and highlighted that she can not be without this medication

## 2023-06-29 NOTE — Telephone Encounter (Signed)
Spoke with patient, patient aware of providers response. [name] had a cancellation for tomorrow at 1 pm and patient moved appointment up

## 2023-06-29 NOTE — Telephone Encounter (Signed)
I called the pharmacy and relayed [name]'s response

## 2023-06-30 ENCOUNTER — Ambulatory Visit (INDEPENDENT_AMBULATORY_CARE_PROVIDER_SITE_OTHER): Payer: Medicare Other | Admitting: Family

## 2023-06-30 ENCOUNTER — Telehealth: Payer: Self-pay | Admitting: Family

## 2023-06-30 DIAGNOSIS — F909 Attention-deficit hyperactivity disorder, unspecified type: Secondary | ICD-10-CM | POA: Diagnosis not present

## 2023-06-30 MED ORDER — RITALIN LA 20 MG PO CP24
ORAL_CAPSULE | ORAL | 0 refills | Status: DC
Start: 2023-06-30 — End: 2023-06-30

## 2023-06-30 MED ORDER — METHYLPHENIDATE HCL ER (LA) 20 MG PO CP24
ORAL_CAPSULE | ORAL | 0 refills | Status: DC
Start: 2023-06-30 — End: 2023-12-14

## 2023-06-30 NOTE — Patient Instructions (Signed)
Please follow up with Psychiatrist for ADHD for further management of Ritalin.

## 2023-06-30 NOTE — Progress Notes (Signed)
Provider: Richarda Blade FNP-C  Ericah Scotto, Donalee Citrin, NP  Patient Care Team: Kyl Givler, Donalee Citrin, NP as PCP - General (Family Medicine)  Extended Emergency Contact Information Primary Emergency Contact: Juanda Chance [place] Mobile Phone: [phone] Relation: Son Secondary Emergency Contact: Pichard,Christian Mobile Phone: [phone] Relation: Son  Code Status:  Full Code  Goals of care: Advanced Directive information    01/26/2023    3:07 PM  Advanced Directives  Does Patient Have a Medical Advance Directive? No  Would patient like information on creating a medical advance directive? No - Patient declined     Chief Complaint  Patient presents with   Medical Management of Chronic Issues    Patient presents today for a month follow-up    HPI:  Pt is a 72 y.o. female seen today for an acute visit to discuss Ritalin medication.Her urine drug test was negative for prescribed medication on 05/24/2023 results was reviewed and told will no longer prescribe Ritalin. Referral order to follow up with Psychiatrist.ritalin one month supply was prescribed but  states pharmacy gave her 5 capsule on Latest prescribed Ritalin will need 25 capsule while awaiting to be seen by Psychiatrist.PDMP reviewed indicates 5 capsule was refilled.Would like referral sent to Dr.Burgeuss.    Past Medical History:  Diagnosis Date   ADHD    Anemia    Cellulitis 07/2018   left lower extremity   Chronic kidney disease    surgery to fix   Chronic pain    Depression    major depression   DVT (deep venous thrombosis) (HCC)    Ekbom's delusional parasitosis (HCC)    Fibromyalgia    History of Zenker's diverticulum removal    Lyme disease    Migraine with aura    Multiple falls    Thyroid disease    hypothyroid   Past Surgical History:  Procedure Laterality Date   APPENDECTOMY  1974   Avascular necrosis of the hip     multiple operations starting in 2005.  bilateral hips   Back  fusion  1999   CERVICAL FUSION  [id number]   EYE SURGERY     eye muscle   HIP SURGERY Left 06/2022   KIDNEY SURGERY  1971   kidney reconstruction   LAMINECTOMY  1985   TOTAL HIP ARTHROPLASTY      Allergies  Allergen Reactions   Amoxicillin-Pot Clavulanate Nausea And Vomiting and Other (See Comments)   Clarithromycin Other (See Comments)    DOES NOT REMEMBER REACTION DOES NOT REMEMBER REACTION    Fentanyl Nausea And Vomiting and Other (See Comments)    FENTANYL PATCH FENTANYL PATCH FENTANYL PATCH    Levofloxacin Swelling    Face,eye swelling Face,eye swelling    Lorazepam Other (See Comments)    DO NOT PUT ANY ATIVAN ON PATIENT'S IV POST SURGERY! DO NOT PUT ANY ATIVAN ON PATIENT'S IV POST SURGERY!    Amoxicillin    Doxycycline Nausea And Vomiting   Penicillins Hives   Sulfa Antibiotics Other (See Comments)    Per pt: unknown    Outpatient Encounter Medications as of 06/30/2023  Medication Sig   buPROPion HCl ER, XL, 450 MG TB24 TAKE ONE TABLET BY MOUTH EVERY DAY   cyclobenzaprine (FLEXERIL) 5 MG tablet TAKE ONE TABLET ONCE DAILY AS NEEDED   escitalopram (LEXAPRO) 20 MG tablet TAKE ONE TABLET BY MOUTH ONCE DAILY   HYDROcodone-acetaminophen (NORCO/VICODIN) 5-325 MG tablet Take 1 tablet by mouth every 8 (eight) hours as needed for  moderate pain (pain score 4-6).   levothyroxine (SYNTHROID) 125 MCG tablet TAKE ONE TABLET BY MOUTH DAILY   methocarbamol (ROBAXIN) 500 MG tablet TAKE 1 OR 2 TABLETS AS NEEDED FOR MUSCLE SPASM   methylphenidate (RITALIN) 5 MG tablet Take 1 tablet (5 mg total) by mouth daily as needed. Do NOT Prescribe at [facility] to be managed by Psychiatrist.Urine drug test was negative for prescribed medication   promethazine (PHENERGAN) 12.5 MG suppository Place 1 suppository (12.5 mg total) rectally every 6 (six) hours as needed for nausea or vomiting.   TOPAMAX 100 MG tablet Take 1 tablet (100 mg total) by mouth 2 (two) times daily. (Patient taking differently:  Take 100 mg by mouth as needed.)   [DISCONTINUED] RITALIN LA 20 MG 24 hr capsule TAKE ONE CAPSULE DAILY   methylphenidate (RITALIN LA) 20 MG 24 hr capsule TAKE ONE CAPSULE DAILY   [DISCONTINUED] RITALIN LA 20 MG 24 hr capsule TAKE ONE CAPSULE DAILY   No facility-administered encounter medications on file as of 06/30/2023.    Review of Systems  Constitutional:  Negative for appetite change, chills, fatigue, fever and unexpected weight change.  Eyes:  Negative for pain, discharge, redness, itching and visual disturbance.  Respiratory:  Negative for cough, chest tightness, shortness of breath and wheezing.   Cardiovascular:  Negative for chest pain, palpitations and leg swelling.  Gastrointestinal:  Negative for abdominal distention, abdominal pain, blood in stool, constipation, diarrhea, nausea and vomiting.  Skin:  Negative for color change, pallor, rash and wound.  Neurological:  Negative for dizziness, weakness, light-headedness, numbness and headaches.  Psychiatric/Behavioral:  Negative for agitation, behavioral problems, confusion, hallucinations and sleep disturbance. The patient is not nervous/anxious.        On Ritalin for ADHD    Immunization History  Administered Date(s) Administered   Influenza, High Dose Seasonal PF 06/09/2018, 09/07/2019   Influenza-Unspecified 09/07/2019   PFIZER(Purple Top)SARS-COV-2 Vaccination 11/19/2019, 02/12/2020   Pneumococcal Conjugate-13 08/22/2015   Pneumococcal Polysaccharide-23 11/10/2020   Pertinent  Health Maintenance Due  Topic Date Due   MAMMOGRAM  09/08/2013   Colonoscopy  12/01/2021   INFLUENZA VACCINE  02/17/2023   DEXA SCAN  Completed      08/04/2021    3:59 PM 03/27/2022   10:36 PM 09/06/2022    2:13 PM 01/10/2023    3:36 PM 01/26/2023    3:29 PM  Fall Risk  Falls in the past year? 0  1 0 1  Was there an injury with Fall? 0  0 0 1  Fall Risk Category Calculator 0  1 0 3  Fall Risk Category (Retired) Low      (RETIRED) Patient  Fall Risk Level Low fall risk Low fall risk     Patient at Risk for Falls Due to No Fall Risks  History of fall(s);Impaired balance/gait;Impaired mobility No Fall Risks No Fall Risks  Fall risk Follow up Falls evaluation completed  Falls evaluation completed;Education provided;Falls prevention discussed Falls evaluation completed Falls evaluation completed   Functional Status Survey:    Vitals:   06/30/23 1309  BP: 128/72  Pulse: 100  Resp: 20  Temp: 97.7 F (36.5 C)  SpO2: 97%  Weight: 126 lb 6.4 oz (57.3 kg)  Height: 5\' 3"  (1.6 m)   Body mass index is 22.39 kg/m. Physical Exam Vitals reviewed.  Constitutional:      General: She is not in acute distress.    Appearance: Normal appearance. She is normal weight. She is not ill-appearing or diaphoretic.  HENT:     Head: Normocephalic.     Nose: Nose normal. No congestion or rhinorrhea.     Mouth/Throat:     Mouth: Mucous membranes are moist.     Pharynx: Oropharynx is clear. No oropharyngeal exudate or posterior oropharyngeal erythema.  Eyes:     General: No scleral icterus.       Right eye: No discharge.        Left eye: No discharge.     Extraocular Movements: Extraocular movements intact.     Conjunctiva/sclera: Conjunctivae normal.     Pupils: Pupils are equal, round, and reactive to light.  Neck:     Vascular: No carotid bruit.  Cardiovascular:     Rate and Rhythm: Normal rate and regular rhythm.     Pulses: Normal pulses.     Heart sounds: Normal heart sounds. No murmur heard.    No friction rub. No gallop.  Pulmonary:     Effort: Pulmonary effort is normal. No respiratory distress.     Breath sounds: Normal breath sounds. No wheezing, rhonchi or rales.  Chest:     Chest wall: No tenderness.  Abdominal:     General: Bowel sounds are normal. There is no distension.     Palpations: Abdomen is soft. There is no mass.     Tenderness: There is no abdominal tenderness. There is no right CVA tenderness, left CVA  tenderness, guarding or rebound.  Musculoskeletal:     Cervical back: Normal range of motion. No rigidity or tenderness.  Lymphadenopathy:     Cervical: No cervical adenopathy.  Skin:    General: Skin is warm and dry.     Coloration: Skin is not pale.     Findings: No bruising, erythema, lesion or rash.  Neurological:     Mental Status: She is alert and oriented to person, place, and time.     Motor: No weakness.     Gait: Gait abnormal.  Psychiatric:        Mood and Affect: Mood normal.        Speech: Speech normal.        Behavior: Behavior normal.     Labs reviewed: Recent Labs    11/15/22 1457 01/10/23 1612 05/24/23 1643  NA 138 143 142  K 4.1 4.4 4.0  CL 109 107 111*  CO2 23 27 23   GLUCOSE 107* 102* 95  BUN 21 16 17   CREATININE 0.97 0.85 0.90  CALCIUM 9.3 9.6 9.7   Recent Labs    08/29/22 1513 11/15/22 1457 01/10/23 1612 05/24/23 1643  AST 19 13 15 14   ALT 12 12 14 9   ALKPHOS 97  --   --   --   BILITOT 0.5 0.3 0.5 0.3  PROT 6.4* 6.5 6.7 7.1  ALBUMIN 3.3*  --   --   --    Recent Labs    11/15/22 1457 01/10/23 1612 05/24/23 1643  WBC 6.4 9.3 6.4  NEUTROABS 4,429 7,096 4,173  HGB 12.7 13.2 14.3  HCT 39.2 40.4 43.6  MCV 86.2 88.6 88.8  PLT 309 310 304   Lab Results  Component Value Date   TSH 0.01 (L) 05/24/2023   Lab Results  Component Value Date   HGBA1C 5.9 (H) 05/24/2023   Lab Results  Component Value Date   CHOL 156 05/24/2023   HDL 64 05/24/2023   LDLCALC 77 05/24/2023   TRIG 70 05/24/2023   CHOLHDL 2.4 05/24/2023    Significant Diagnostic Results in  last 30 days:  No results found.  Assessment/Plan Attention deficit hyperactivity disorder (ADHD), unspecified ADHD type Refill 25 capsule that was not filled at the pharmacy  - Recommend referral to psychiatrist for further management of ADHD made aware PCP will no longer prescribe Ritalin latest UDT showed no prescribed medication.  - methylphenidate (RITALIN LA) 20 MG 24 hr  capsule; TAKE ONE CAPSULE DAILY  Dispense: 25 capsule; Refill: 0  Family/ staff Communication: Reviewed plan of care with patient verbalized understanding   Labs/tests ordered: None   Next Appointment: Return if symptoms worsen or fail to improve.   Caesar Bookman, NP

## 2023-06-30 NOTE — Telephone Encounter (Signed)
Please pickup stool specimen supply to check for blood.

## 2023-06-30 NOTE — Telephone Encounter (Signed)
Patient called at 4:20 p.m. and said she forgot to mention to you during her visit today that for the last two days her stools have been black. She was wondering if the MRI needed to be done sooner.

## 2023-07-06 ENCOUNTER — Encounter: Payer: Self-pay | Admitting: Family

## 2023-07-06 ENCOUNTER — Ambulatory Visit: Payer: Medicare Other | Admitting: Family

## 2023-07-07 NOTE — Telephone Encounter (Signed)
Patient states she is currently feeling better but would call back if she need to come pick up the stool specimen kit.

## 2023-07-14 ENCOUNTER — Other Ambulatory Visit: Payer: Self-pay | Admitting: Family

## 2023-07-14 DIAGNOSIS — F331 Major depressive disorder, recurrent, moderate: Secondary | ICD-10-CM

## 2023-09-13 ENCOUNTER — Telehealth: Payer: Self-pay

## 2023-09-13 NOTE — Telephone Encounter (Signed)
 Incoming fax received from patients pharmacy to initiate a prior authorization for                   .  PA initiated through covermymeds. Key: [id number]  Awaiting reply from the insurance company which will be determined in 48-72 hours.

## 2023-09-15 NOTE — Telephone Encounter (Signed)
 Received [employer] fax stating that Bupropion was APPROVED till 09/12/2024  Member ID: [id number]

## 2023-09-17 ENCOUNTER — Other Ambulatory Visit: Payer: Self-pay | Admitting: Family

## 2023-09-17 DIAGNOSIS — G8929 Other chronic pain: Secondary | ICD-10-CM

## 2023-09-19 NOTE — Telephone Encounter (Signed)
 High risk or very high risk warning populated when attempting to refill medication. RX request sent to PCP for review and approval if warranted.

## 2023-09-19 NOTE — Telephone Encounter (Signed)
 Please schedule 6 months follow up appointment in June ,2025.

## 2023-09-20 NOTE — Telephone Encounter (Signed)
 In office visit preferred for 6 months visit so that we can completed assessment.

## 2023-09-20 NOTE — Telephone Encounter (Signed)
 Dinah please advise if ok to scheduled patient for video visit

## 2023-10-10 ENCOUNTER — Encounter: Admitting: Family

## 2023-10-16 NOTE — Progress Notes (Signed)
   This encounter was created in error - please disregard. No show

## 2023-10-24 ENCOUNTER — Encounter (HOSPITAL_COMMUNITY): Payer: Self-pay | Admitting: Psychiatry

## 2023-10-24 ENCOUNTER — Telehealth (HOSPITAL_COMMUNITY): Payer: Self-pay | Admitting: *Deleted

## 2023-10-24 ENCOUNTER — Telehealth (HOSPITAL_BASED_OUTPATIENT_CLINIC_OR_DEPARTMENT_OTHER): Admitting: Psychiatry

## 2023-10-24 VITALS — Wt 126.0 lb

## 2023-10-24 DIAGNOSIS — F325 Major depressive disorder, single episode, in full remission: Secondary | ICD-10-CM

## 2023-10-24 DIAGNOSIS — F901 Attention-deficit hyperactivity disorder, predominantly hyperactive type: Secondary | ICD-10-CM

## 2023-10-24 DIAGNOSIS — F419 Anxiety disorder, unspecified: Secondary | ICD-10-CM

## 2023-10-24 MED ORDER — ATOMOXETINE HCL 10 MG PO CAPS: 10.0000 mg | ORAL_CAPSULE | Freq: Every day | ORAL | 0 refills | Status: DC

## 2023-10-24 MED ORDER — ATOMOXETINE HCL 25 MG PO CAPS: 25.0000 mg | ORAL_CAPSULE | Freq: Every day | ORAL | 0 refills | Status: DC

## 2023-10-24 NOTE — Telephone Encounter (Signed)
 I spoke to the pharmacist.  We will reduce the dose of Strattera to 10 mg only once a day.  Will consider to lower the Wellbutrin on her next follow-up appointment.  She is taking a moderate dose of Wellbutrin 450 mg XL once a day.

## 2023-10-24 NOTE — Telephone Encounter (Signed)
 Writer received a message from Pharmacist at [facility] that there is a "severe"  interaction between the Strattera and the Wellbutrin XL 400 mg. Due to high dose of the Wellbutrin this may cause an unusually high concentration of the Straterra. Please review and advise.
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
## 2023-11-11 ENCOUNTER — Other Ambulatory Visit: Payer: Self-pay | Admitting: Family

## 2023-11-11 DIAGNOSIS — F331 Major depressive disorder, recurrent, moderate: Secondary | ICD-10-CM

## 2023-11-22 ENCOUNTER — Telehealth (HOSPITAL_COMMUNITY): Payer: Self-pay | Admitting: Psychiatry

## 2023-11-22 ENCOUNTER — Encounter (HOSPITAL_COMMUNITY): Payer: Self-pay

## 2023-11-22 ENCOUNTER — Telehealth (HOSPITAL_BASED_OUTPATIENT_CLINIC_OR_DEPARTMENT_OTHER): Payer: Self-pay | Admitting: Psychiatry

## 2023-11-22 ENCOUNTER — Encounter (HOSPITAL_COMMUNITY): Payer: Self-pay | Admitting: Psychiatry

## 2023-11-22 DIAGNOSIS — Z91199 Patient's noncompliance with other medical treatment and regimen due to unspecified reason: Secondary | ICD-10-CM

## 2023-11-22 NOTE — Telephone Encounter (Signed)
 Per provider, patient has been discharged due to missed appointments. Patient has been sent dismissal letter via [employer] and certified mail on 11/22/2023.

## 2023-11-22 NOTE — Progress Notes (Signed)
 Patient is no-show on video platform.  Per history she saw once back in June 2019 and never had a follow-up.  We will close the case and refer her out

## 2023-11-23 HISTORY — PX: ORTHOPEDIC SURGERY: SHX850

## 2023-12-01 ENCOUNTER — Ambulatory Visit: Admitting: Family

## 2023-12-05 ENCOUNTER — Encounter: Admitting: Family

## 2023-12-08 ENCOUNTER — Encounter: Admitting: Family

## 2023-12-12 NOTE — Progress Notes (Signed)
   This encounter was created in error - please disregard. No show

## 2023-12-13 ENCOUNTER — Ambulatory Visit: Admitting: Family

## 2023-12-14 ENCOUNTER — Encounter: Payer: Self-pay | Admitting: Family

## 2023-12-14 ENCOUNTER — Ambulatory Visit (INDEPENDENT_AMBULATORY_CARE_PROVIDER_SITE_OTHER): Admitting: Family

## 2023-12-14 VITALS — BP 126/70 | HR 81 | Temp 97.9°F | Resp 18 | Ht 63.0 in | Wt 125.6 lb

## 2023-12-14 DIAGNOSIS — F331 Major depressive disorder, recurrent, moderate: Secondary | ICD-10-CM | POA: Diagnosis not present

## 2023-12-14 DIAGNOSIS — R7303 Prediabetes: Secondary | ICD-10-CM

## 2023-12-14 DIAGNOSIS — Z1159 Encounter for screening for other viral diseases: Secondary | ICD-10-CM

## 2023-12-14 DIAGNOSIS — G8928 Other chronic postprocedural pain: Secondary | ICD-10-CM | POA: Insufficient documentation

## 2023-12-14 DIAGNOSIS — Z1231 Encounter for screening mammogram for malignant neoplasm of breast: Secondary | ICD-10-CM

## 2023-12-14 DIAGNOSIS — F909 Attention-deficit hyperactivity disorder, unspecified type: Secondary | ICD-10-CM

## 2023-12-14 DIAGNOSIS — E039 Hypothyroidism, unspecified: Secondary | ICD-10-CM | POA: Diagnosis not present

## 2023-12-14 DIAGNOSIS — Z13220 Encounter for screening for lipoid disorders: Secondary | ICD-10-CM

## 2023-12-14 DIAGNOSIS — K219 Gastro-esophageal reflux disease without esophagitis: Secondary | ICD-10-CM

## 2023-12-14 DIAGNOSIS — D509 Iron deficiency anemia, unspecified: Secondary | ICD-10-CM

## 2023-12-14 MED ORDER — METHYLPHENIDATE HCL ER (LA) 20 MG PO CP24
ORAL_CAPSULE | ORAL | 0 refills | Status: DC
Start: 2023-12-14 — End: 2024-01-18

## 2023-12-14 MED ORDER — TRAMADOL HCL 50 MG PO TABS: 50.0000 mg | ORAL_TABLET | Freq: Four times a day (QID) | ORAL | 3 refills | Status: DC | PRN

## 2023-12-14 MED ORDER — METHYLPHENIDATE HCL 5 MG PO TABS
5.0000 mg | ORAL_TABLET | Freq: Every day | ORAL | 0 refills | Status: DC | PRN
Start: 2023-12-14 — End: 2024-02-07

## 2023-12-14 NOTE — Progress Notes (Signed)
 Provider: Christean Courts FNP-C   Chenel Wernli, Elijio Guadeloupe, NP  Patient Care Team: [place] Zou, Elijio Guadeloupe, NP as PCP - General (Family Medicine)  Extended Emergency Contact Information Primary Emergency Contact: Pedley,Peter  [place] Mobile Phone: [phone] Relation: Son Secondary Emergency Contact: Pichard,Christian Mobile Phone: [phone] Relation: Son  Code Status:  Full Code  Goals of care: Advanced Directive information    12/14/2023   11:23 AM  Advanced Directives  Does Patient Have a Medical Advance Directive? No  Would patient like information on creating a medical advance directive? No - Patient declined     Chief Complaint  Patient presents with   Medical Management of Chronic Issues    Routine Visit       Discussed the use of AI scribe software for clinical note transcription with the patient, who gave verbal consent to proceed.  History of Present Illness   [name] is a 73 year old female who presents for 6 months follow-up and after recent left leg surgery.  She recently underwent surgery to straighten her leg and continues to require a walker for ambulation. She attends physical therapy two to three times a week, which she finds very painful, especially after returning home, experiencing severe pain for up to two days post-therapy. She was informed that the muscle around the surgical site was gone and needs to be rebuilt.  She is currently taking Eliquis daily due to a history of blood clots, which she has experienced after previous surgeries. She recalls a past incident where swelling led to a hospital admission. She is concerned about taking Tylenol  with Eliquis. For pain management, she has been prescribed oxycodone 5 mg, which she takes sparingly, primarily after therapy sessions, and tramadol 50 mg for regular pain. She has not been using hydrocodone  due to stomach issues.  She has been off Ritalin , which she has been taking for ADHD for  fifty years and reports significant difficulty sleeping since discontinuation. She describes her current situation as 'horrible' due to lack of sleep and increased depression, exacerbated by her ongoing divorce. She is seeking to resume Ritalin  use until she can find a new psychiatrist.  Her current medication regimen includes Eliquis, Lexapro  20 mg, Wellbutrin  450 mg, and Synthroid  125 mcg. She has stopped taking Voltaren  and meloxicam. She uses Protonix  for acid reflux, which she takes in the morning with Eliquis, and Senokot as needed for constipation. She is out of Zofran , which she uses for nausea.  She has a history of ADHD since childhood and is currently experiencing situational depression due to personal circumstances, including a divorce. She is actively seeking a psychiatrist for ongoing management of her ADHD and depression.  She has a history of prediabetes and is aware of the need for regular monitoring of her blood glucose levels. She plans to return for fasting lab work to check her cholesterol and A1c levels.   Past Medical History:  Diagnosis Date   ADHD    Anemia    Cellulitis 07/2018   left lower extremity   Chronic kidney disease    surgery to fix   Chronic pain    Depression    major depression   DVT (deep venous thrombosis) (HCC)    Ekbom's delusional parasitosis (HCC)    Fibromyalgia    History of Zenker's diverticulum removal    Lyme disease    Migraine with aura    Multiple falls    Thyroid  disease    hypothyroid   Past Surgical  History:  Procedure Laterality Date   APPENDECTOMY  1974   Avascular necrosis of the hip     multiple operations starting in 2005.  bilateral hips   Back fusion  1999   CERVICAL FUSION  [id number]   EYE SURGERY     eye muscle   HIP SURGERY Left 06/2022   KIDNEY SURGERY  1971   kidney reconstruction   LAMINECTOMY  1985   ORTHOPEDIC SURGERY  11/23/2023   SURGERY IN [place]    TOTAL HIP ARTHROPLASTY      Allergies   Allergen Reactions   Amoxicillin-Pot Clavulanate Nausea And Vomiting and Other (See Comments)   Clarithromycin Other (See Comments)    DOES NOT REMEMBER REACTION DOES NOT REMEMBER REACTION    Fentanyl Nausea And Vomiting and Other (See Comments)    FENTANYL PATCH FENTANYL PATCH FENTANYL PATCH    Levofloxacin Swelling    Face,eye swelling Face,eye swelling    Lorazepam  Other (See Comments)    DO NOT PUT ANY ATIVAN  ON PATIENT'S IV POST SURGERY! DO NOT PUT ANY ATIVAN  ON PATIENT'S IV POST SURGERY!    Amoxicillin    Doxycycline Nausea And Vomiting   Penicillins Hives   Sulfa Antibiotics Other (See Comments)    Per pt: unknown    Allergies as of 12/14/2023       Reactions   Amoxicillin-pot Clavulanate Nausea And Vomiting, Other (See Comments)   Clarithromycin Other (See Comments)   DOES NOT REMEMBER REACTION DOES NOT REMEMBER REACTION   Fentanyl Nausea And Vomiting, Other (See Comments)   FENTANYL PATCH FENTANYL PATCH FENTANYL PATCH   Levofloxacin Swelling   Face,eye swelling Face,eye swelling   Lorazepam  Other (See Comments)   DO NOT PUT ANY ATIVAN  ON PATIENT'S IV POST SURGERY! DO NOT PUT ANY ATIVAN  ON PATIENT'S IV POST SURGERY!   Amoxicillin    Doxycycline Nausea And Vomiting   Penicillins Hives   Sulfa Antibiotics Other (See Comments)   Per pt: unknown        Medication List        Accurate as of Dec 14, 2023 12:33 PM. If you have any questions, ask your nurse or doctor.          STOP taking these medications    atomoxetine  10 MG capsule Commonly known as: Strattera  Stopped by: Lyzette Reinhardt C Jolyn Deshmukh   cyclobenzaprine  5 MG tablet Commonly known as: FLEXERIL  Stopped by: Kian Ottaviano C Sayf Kerner   HYDROcodone -acetaminophen  5-325 MG tablet Commonly known as: NORCO/VICODIN Stopped by: Ardis Fullwood C Dallas Torok   methocarbamol  500 MG tablet Commonly known as: ROBAXIN  Stopped by: Aariya Ferrick C Nussen Pullin   Topamax  100 MG tablet Generic drug: topiramate  Stopped by: Jamar Weatherall C  Giuseppina Quinones       TAKE these medications    buPROPion  HCl ER (XL) 450 MG Tb24 TAKE ONE TABLET BY MOUTH EVERY DAY   escitalopram  20 MG tablet Commonly known as: LEXAPRO  TAKE ONE TABLET BY MOUTH ONCE DAILY   levothyroxine  125 MCG tablet Commonly known as: SYNTHROID  TAKE ONE TABLET BY MOUTH DAILY   methylphenidate  20 MG 24 hr capsule Commonly known as: Ritalin  LA TAKE ONE CAPSULE DAILY   methylphenidate  5 MG tablet Commonly known as: Ritalin  Take 1 tablet (5 mg total) by mouth daily as needed. Do NOT Prescribe at [facility] to be managed by Psychiatrist.Urine drug test was negative for prescribed medication   ondansetron  4 MG tablet Commonly known as: ZOFRAN  Take 4 mg by mouth every 8 (eight) hours as needed for nausea or vomiting.  pantoprazole  40 MG tablet Commonly known as: PROTONIX  Take 40 mg by mouth daily.   promethazine  12.5 MG suppository Commonly known as: PHENERGAN  Place 1 suppository (12.5 mg total) rectally every 6 (six) hours as needed for nausea or vomiting.   senna 8.6 MG tablet Commonly known as: SENOKOT Take 1 tablet by mouth daily as needed for constipation.   traMADol 50 MG tablet Commonly known as: ULTRAM Take 50 mg by mouth every 6 (six) hours as needed for moderate pain (pain score 4-6).        Review of Systems  Immunization History  Administered Date(s) Administered   Influenza, High Dose Seasonal PF 06/09/2018, 09/07/2019, 06/07/2022   Influenza-Unspecified 09/07/2019   PFIZER(Purple Top)SARS-COV-2 Vaccination 11/19/2019, 02/12/2020   Pneumococcal Conjugate-13 08/22/2015   Pneumococcal Polysaccharide-23 11/10/2020   Pertinent  Health Maintenance Due  Topic Date Due   MAMMOGRAM  09/08/2013   Colonoscopy  12/01/2021   INFLUENZA VACCINE  02/17/2024   DEXA SCAN  Completed      03/27/2022   10:36 PM 09/06/2022    2:13 PM 01/10/2023    3:36 PM 01/26/2023    3:29 PM 12/14/2023   11:22 AM  Fall Risk  Falls in the past year?  1 0 1 0  Was there  an injury with Fall?  0 0 1 0  Fall Risk Category Calculator  1 0 3 0  (RETIRED) Patient Fall Risk Level Low fall risk      Patient at Risk for Falls Due to  History of fall(s);Impaired balance/gait;Impaired mobility No Fall Risks No Fall Risks History of fall(s)  Fall risk Follow up  Falls evaluation completed;Education provided;Falls prevention discussed Falls evaluation completed Falls evaluation completed Falls evaluation completed   Functional Status Survey:    Vitals:   12/14/23 1129  BP: 126/70  Pulse: 81  Resp: 18  Temp: 97.9 F (36.6 C)  SpO2: 98%  Weight: 125 lb 9.6 oz (57 kg)  Height: 5\' 3"  (1.6 m)   Body mass index is 22.25 kg/m. Physical Exam VITALS: T- 97.9, P- 81, BP- 126/70, SaO2- 98% MEASUREMENTS: Weight- 125.6. GENERAL: Alert, cooperative, well developed, no acute distress HEENT: Normocephalic, normal oropharynx, moist mucous membranes, ears normal, nose normal, extraocular movements intact, no sinus tenderness CHEST: Clear to auscultation bilaterally, no wheezes, rhonchi, or crackles CARDIOVASCULAR: Normal heart rate and rhythm, S1 and S2 normal without murmurs ABDOMEN: Soft, non-tender, non-distended, without organomegaly, normal bowel sounds, abdomen normal EXTREMITIES: No cyanosis or edema, extremities normal NEUROLOGICAL: Cranial nerves grossly intact, moves all extremities without gross motor or sensory deficit  SKIN: No rash,no lesion or erythema  Left hip incision healed without any erythema or drainage noted  PSYCHIATRY/BEHAVIORAL: Mood stable    Labs reviewed: Recent Labs    01/10/23 1612 05/24/23 1643  NA 143 142  K 4.4 4.0  CL 107 111*  CO2 27 23  GLUCOSE 102* 95  BUN 16 17  CREATININE 0.85 0.90  CALCIUM 9.6 9.7   Recent Labs    01/10/23 1612 05/24/23 1643  AST 15 14  ALT 14 9  BILITOT 0.5 0.3  PROT 6.7 7.1   Recent Labs    01/10/23 1612 05/24/23 1643  WBC 9.3 6.4  NEUTROABS 7,096 4,173  HGB 13.2 14.3  HCT 40.4 43.6  MCV  88.6 88.8  PLT 310 304   Lab Results  Component Value Date   TSH 0.01 (L) 05/24/2023   Lab Results  Component Value Date   HGBA1C 5.9 (H)  05/24/2023   Lab Results  Component Value Date   CHOL 156 05/24/2023   HDL 64 05/24/2023   LDLCALC 77 05/24/2023   TRIG 70 05/24/2023   CHOLHDL 2.4 05/24/2023    Significant Diagnostic Results in last 30 days:  No results found.  Assessment/Plan  Postoperative pain left hip  Postoperative pain following recent leg surgery, severe after physical therapy sessions, lasting two days. Pain management includes tramadol for regular pain and oxycodone for severe pain post-therapy. Attending physical therapy two to three times weekly to rebuild muscle and improve mobility. - Continue physical therapy sessions - Refill tramadol prescription - Use oxycodone 5 mg sparingly for severe pain post-therapy - Allow use of acetaminophen  for pain management  Use of anticoagulant therapy On apixaban due to blood clots post-surgery. Concerns about potential interactions and side effects. Advised that acetaminophen  can be taken with apixaban. Pantoprazole  recommended to prevent stomach irritation due to apixaban. - Take pantoprazole  once daily in the morning with apixaban to prevent stomach irritation  ADHD Long-standing ADHD, off methylphenidate  since surgery. Reports difficulty sleeping and increased depression since discontinuation. Prefers to find a psychiatrist independently for methylphenidate  management. - Provide external referral for psychiatry to assist with methylphenidate  management  Situational depression Increased depression attributed to discontinuation of methylphenidate  and situational stressors, including ongoing divorce. Believes resuming methylphenidate  will help alleviate depressive symptoms. - Provide external referral for psychiatry to assist with methylphenidate  management  Prediabetes Prediabetic, last A1c checked six months ago.  Discussed potential link between prediabetes and parasitic infections. - Schedule fasting lab work for CBC, CMP, TSH, A1c, and cholesterol - Screen for hepatitis C during lab work  Hypothyroidism On levothyroxine  125 mcg daily for hypothyroidism. - Continue levothyroxine  125 mcg daily  General Health Maintenance Requires updates on immunizations and routine screenings. Discussed importance of tetanus, shingles, and RSV vaccines. Needs to schedule a mammogram and colonoscopy. - Get Tdap vaccine at pharmacy - Get shingles and RSV vaccines at pharmacy - Schedule mammogram at breast center - Schedule colonoscopy with gastroenterology   Family/ staff Communication: Reviewed plan of care with patient verbalized understanding   Labs/tests ordered:  - CBC with Differential/Platelet - CMP with eGFR([facility]) - TSH - Hgb A1C - Lipid panel - Hep C Antibody  Next Appointment : Return in about 6 months (around 06/15/2024) for medical mangement of chronic issues., Fasting labs in the morning.   Spent 35 minutes of Face to face and non-face to face with patient  >50% time spent counseling; reviewing medical record; tests; labs; documentation and developing future plan of care.   Estil Heman, NP

## 2023-12-15 ENCOUNTER — Other Ambulatory Visit

## 2023-12-18 NOTE — Progress Notes (Signed)
   This encounter was created in error - please disregard. No show

## 2023-12-20 ENCOUNTER — Telehealth: Payer: Self-pay | Admitting: *Deleted

## 2023-12-20 ENCOUNTER — Other Ambulatory Visit: Payer: Self-pay | Admitting: Family

## 2023-12-20 DIAGNOSIS — E039 Hypothyroidism, unspecified: Secondary | ICD-10-CM

## 2023-12-20 NOTE — Telephone Encounter (Signed)
 Received fax from [facility] for Prior Authorization for patient's Mehylphenidate HCL ER 20mg   Placed PA Request Form in Dinah's folder to Review, Fill out and sign.   To be faxed back to [facility] Fax#: [phone] once completed.

## 2023-12-20 NOTE — Telephone Encounter (Signed)
 Patient will be following up with Psychiatry for management of ADHD

## 2023-12-27 ENCOUNTER — Other Ambulatory Visit: Payer: Self-pay | Admitting: Family

## 2023-12-27 ENCOUNTER — Ambulatory Visit: Payer: Self-pay

## 2023-12-27 DIAGNOSIS — G43109 Migraine with aura, not intractable, without status migrainosus: Secondary | ICD-10-CM

## 2023-12-27 NOTE — Telephone Encounter (Signed)
 Copied from CRM [id number]. Topic: Clinical - Red Word Triage >> Dec 27, 2023  4:13 PM Brittney F wrote: [facility] that prompted transfer to Nurse Triage:   Back and shoulder pain; out of breath then started sweating profusely    Reason for Disposition  [1] MILD difficulty breathing (e.g., minimal/no SOB at rest, SOB with walking, pulse <100) AND [2] NEW-onset or WORSE than normal  Answer Assessment - Initial Assessment Questions 1. RESPIRATORY STATUS: "Describe your breathing?" (e.g., wheezing, shortness of breath, unable to speak, severe coughing)      "Getting out of breath" 2. ONSET: "When did this breathing problem begin?"      Today  3. PATTERN "Does the difficult breathing come and go, or has it been constant since it started?"      Constant  4. SEVERITY: "How bad is your breathing?" (e.g., mild, moderate, severe)    - MILD: No SOB at rest, mild SOB with walking, speaks normally in sentences, can lie down, no retractions, pulse < 100.    - MODERATE: SOB at rest, SOB with minimal exertion and prefers to sit, cannot lie down flat, speaks in phrases, mild retractions, audible wheezing, pulse 100-120.    - SEVERE: Very SOB at rest, speaks in single words, struggling to breathe, sitting hunched forward, retractions, pulse > 120      Mild to moderate  5. RECURRENT SYMPTOM: "Have you had difficulty breathing before?" If Yes, ask: "When was the last time?" and "What happened that time?"      No 6. CARDIAC HISTORY: "Do you have any history of heart disease?" (e.g., heart attack, angina, bypass surgery, angioplasty)      No 7. LUNG HISTORY: "Do you have any history of lung disease?"  (e.g., pulmonary embolus, asthma, emphysema)     No 8. CAUSE: "What do you think is causing the breathing problem?"      Unsure 9. OTHER SYMPTOMS: "Do you have any other symptoms? (e.g., dizziness, runny nose, cough, chest pain, fever)     Sweatiness, back pain, shoulder pain  Protocols used: Breathing  Difficulty-A-AH   FYI Only or Action Required?: FYI only for provider  Patient was last seen in primary care on 12/14/2023 by Ngetich, Elijio Guadeloupe, NP. Called Nurse Triage reporting Back Pain and Shortness of Breath. Symptoms began today. Interventions attempted: Rest, hydration, or home remedies. Symptoms are: improved with rest.  Triage Disposition: See HCP Within 4 Hours (Or PCP Triage)  Appointment tomorrow, patient instructed to go to the ED for worsening symptoms.   Patient/caregiver understands and will follow disposition?: Yes with modification

## 2023-12-28 ENCOUNTER — Ambulatory Visit: Admitting: Family

## 2023-12-28 ENCOUNTER — Emergency Department (HOSPITAL_COMMUNITY)
Admission: EM | Admit: 2023-12-28 | Discharge: 2023-12-28 | Disposition: A | Discharge status: Home / Self Care | Attending: Emergency Medicine | Admitting: Emergency Medicine

## 2023-12-28 ENCOUNTER — Other Ambulatory Visit: Payer: Self-pay | Admitting: Family

## 2023-12-28 ENCOUNTER — Emergency Department (HOSPITAL_COMMUNITY)

## 2023-12-28 ENCOUNTER — Encounter (HOSPITAL_COMMUNITY): Payer: Self-pay

## 2023-12-28 ENCOUNTER — Other Ambulatory Visit: Payer: Self-pay

## 2023-12-28 DIAGNOSIS — M542 Cervicalgia: Secondary | ICD-10-CM | POA: Diagnosis not present

## 2023-12-28 DIAGNOSIS — Y900 Blood alcohol level of less than 20 mg/100 ml: Secondary | ICD-10-CM | POA: Diagnosis not present

## 2023-12-28 DIAGNOSIS — I6782 Cerebral ischemia: Secondary | ICD-10-CM | POA: Insufficient documentation

## 2023-12-28 DIAGNOSIS — G629 Polyneuropathy, unspecified: Secondary | ICD-10-CM | POA: Insufficient documentation

## 2023-12-28 DIAGNOSIS — R200 Anesthesia of skin: Secondary | ICD-10-CM | POA: Diagnosis not present

## 2023-12-28 DIAGNOSIS — R1011 Right upper quadrant pain: Secondary | ICD-10-CM

## 2023-12-28 DIAGNOSIS — R519 Headache, unspecified: Secondary | ICD-10-CM | POA: Diagnosis present

## 2023-12-28 LAB — I-STAT CHEM 8, ED
BUN: 16 mg/dL (ref 8–23)
Calcium, Ion: 1.15 mmol/L (ref 1.15–1.40)
Chloride: 108 mmol/L (ref 98–111)
Creatinine, Ser: 0.9 mg/dL (ref 0.44–1.00)
Glucose, Bld: 91 mg/dL (ref 70–99)
HCT: 37 % (ref 36.0–46.0)
Hemoglobin: 12.6 g/dL (ref 12.0–15.0)
Potassium: 4 mmol/L (ref 3.5–5.1)
Sodium: 142 mmol/L (ref 135–145)
TCO2: 18 mmol/L — ABNORMAL LOW (ref 22–32)

## 2023-12-28 LAB — DIFFERENTIAL
Abs Immature Granulocytes: 0.02 10*3/uL (ref 0.00–0.07)
Basophils Absolute: 0.1 10*3/uL (ref 0.0–0.1)
Basophils Relative: 2 %
Eosinophils Absolute: 0.4 10*3/uL (ref 0.0–0.5)
Eosinophils Relative: 6 %
Immature Granulocytes: 0 %
Lymphocytes Relative: 20 %
Lymphs Abs: 1.2 10*3/uL (ref 0.7–4.0)
Monocytes Absolute: 0.7 10*3/uL (ref 0.1–1.0)
Monocytes Relative: 11 %
Neutro Abs: 3.8 10*3/uL (ref 1.7–7.7)
Neutrophils Relative %: 61 %

## 2023-12-28 LAB — COMPREHENSIVE METABOLIC PANEL WITH GFR
ALT: 15 U/L (ref 0–44)
AST: 19 U/L (ref 15–41)
Albumin: 3.8 g/dL (ref 3.5–5.0)
Alkaline Phosphatase: 69 U/L (ref 38–126)
Anion gap: 10 (ref 5–15)
BUN: 16 mg/dL (ref 8–23)
CO2: 18 mmol/L — ABNORMAL LOW (ref 22–32)
Calcium: 9.1 mg/dL (ref 8.9–10.3)
Chloride: 108 mmol/L (ref 98–111)
Creatinine, Ser: 0.88 mg/dL (ref 0.44–1.00)
GFR, Estimated: >60 mL/min (ref >60)
Glucose, Bld: 91 mg/dL (ref 70–99)
Potassium: 4.1 mmol/L (ref 3.5–5.1)
Sodium: 136 mmol/L (ref 135–145)
Total Bilirubin: 0.4 mg/dL (ref 0.0–1.2)
Total Protein: 6.8 g/dL (ref 6.5–8.1)

## 2023-12-28 LAB — CBC
HCT: 37.1 % (ref 36.0–46.0)
Hemoglobin: 11.7 g/dL — ABNORMAL LOW (ref 12.0–15.0)
MCH: 27.7 pg (ref 26.0–34.0)
MCHC: 31.5 g/dL (ref 30.0–36.0)
MCV: 87.7 fL (ref 80.0–100.0)
Platelets: 342 10*3/uL (ref 150–400)
RBC: 4.23 MIL/uL (ref 3.87–5.11)
RDW: 13.5 % (ref 11.5–15.5)
WBC: 6.2 10*3/uL (ref 4.0–10.5)
nRBC: 0 % (ref 0.0–0.2)

## 2023-12-28 LAB — PROTIME-INR
INR: 1 (ref 0.8–1.2)
Prothrombin Time: 13.6 s (ref 11.4–15.2)

## 2023-12-28 LAB — ETHANOL: Alcohol, Ethyl (B): <15 mg/dL (ref <15)

## 2023-12-28 LAB — APTT: aPTT: 23 s — ABNORMAL LOW (ref 24–36)

## 2023-12-28 MED ORDER — OXYCODONE-ACETAMINOPHEN 5-325 MG PO TABS
1.0000 | ORAL_TABLET | Freq: Once | ORAL | Status: AC
  Administered 2023-12-28: 1 via ORAL
  Filled 2023-12-28: qty 1

## 2023-12-28 MED ORDER — OXYCODONE-ACETAMINOPHEN 5-325 MG PO TABS
1.0000 | ORAL_TABLET | Freq: Once | ORAL | Status: DC
  Filled 2023-12-28: qty 1

## 2023-12-28 MED ORDER — TOPIRAMATE 25 MG PO TABS
50.0000 mg | ORAL_TABLET | Freq: Once | ORAL | Status: AC
  Administered 2023-12-28: 50 mg via ORAL
  Filled 2023-12-28: qty 2

## 2023-12-28 NOTE — ED Provider Notes (Signed)
 [facility] AT [facility] Provider Note   CSN: [id number] Arrival date & time: 12/28/23  1425     History  Chief Complaint  Patient presents with   Foot numbness     [name] is a 73 y.o. female.  Patient to ED with complaint of numbness in the left foot that started this morning. She states with her foot numb she has been unable to walk. She reports repeat surgery to her left hip and pelvis 5 weeks ago, asymptomatic until today. She also reports headache pain to the left parietal scalp and neck pain for several days.   The history is provided by the patient. No language interpreter was used.       Home Medications Prior to Admission medications   Medication Sig Start Date End Date Taking? Authorizing Provider  buPROPion  HCl ER, XL, 450 MG TB24 TAKE ONE TABLET BY MOUTH EVERY DAY 06/29/23   Ngetich, Dinah C, NP  escitalopram  (LEXAPRO ) 20 MG tablet TAKE ONE TABLET BY MOUTH ONCE DAILY 11/11/23   Ngetich, Dinah C, NP  levothyroxine  (SYNTHROID ) 125 MCG tablet Take 1 tablet (125 mcg total) by mouth daily. APPOINTMENT DUE 12/20/23   Ngetich, Dinah C, NP  methylphenidate  (RITALIN  LA) 20 MG 24 hr capsule TAKE ONE CAPSULE DAILY 12/14/23   Ngetich, Dinah C, NP  methylphenidate  (RITALIN ) 5 MG tablet Take 1 tablet (5 mg total) by mouth daily as needed. Do NOT Prescribe at [facility] to be managed by Psychiatrist.Urine drug test was negative for prescribed medication 12/14/23   Ngetich, Dinah C, NP  ondansetron  (ZOFRAN ) 4 MG tablet Take 4 mg by mouth every 8 (eight) hours as needed for nausea or vomiting.    [provider]  pantoprazole  (PROTONIX ) 40 MG tablet Take 40 mg by mouth daily.    [provider]  promethazine  (PHENERGAN ) 12.5 MG suppository Place 1 suppository (12.5 mg total) rectally every 6 (six) hours as needed for nausea or vomiting. 01/28/23   Ngetich, Dinah C, NP  PROMETHEGAN 25 MG suppository place ONE-HALF SUPPOSITORY (12.5mg  total)  rectally every SIX hours AS NEEDED FOR NAUSEA AND VOMITING 12/28/23   Ngetich, Dinah C, NP  senna (SENOKOT) 8.6 MG tablet Take 1 tablet by mouth daily as needed for constipation.    [provider]  topiramate  (TOPAMAX ) 50 MG tablet Take 1 tablet (50 mg total) by mouth 2 (two) times daily. 12/29/23   Ngetich, Dinah C, NP  traMADol  (ULTRAM ) 50 MG tablet Take 1 tablet (50 mg total) by mouth every 6 (six) hours as needed for moderate pain (pain score 4-6). 12/14/23   Ngetich, Dinah C, NP      Allergies    Amoxicillin-pot clavulanate, Clarithromycin, Fentanyl, Levofloxacin, Lorazepam , Amoxicillin, Doxycycline, Penicillins, and Sulfa antibiotics    Review of Systems   Review of Systems  Physical Exam Updated Vital Signs BP 128/79   Pulse 77   Temp 98 F (36.7 C) (Oral)   Resp 19   Ht 5' 3 (1.6 m)   Wt 54.4 kg   LMP 07/19/2000 (Approximate)   SpO2 97%   BMI 21.26 kg/m  Physical Exam Vitals and nursing note reviewed.  Constitutional:      Appearance: Normal appearance.  HENT:     Head: Normocephalic and atraumatic.   Cardiovascular:     Rate and Rhythm: Normal rate and regular rhythm.  Pulmonary:     Effort: Pulmonary effort is normal.     Breath sounds: No wheezing, rhonchi  or rales.  Abdominal:     Palpations: Abdomen is soft.   Musculoskeletal:     Cervical back: Normal range of motion and neck supple.     Comments: FROM LE's bilaterally. To examine the left foot, the patient is able to raise the left leg without limitation or weakness. She has plantar and dorsiflexion strength bilaterally. Pulses to distal LE's present bilaterally. No discoloration, no swelling, no calf tenderness.    Skin:    General: Skin is warm and dry.   Neurological:     Mental Status: She is alert and oriented to person, place, and time.     Sensory: Sensory deficit (Subjective loss of sensation to left foot.) present.     Motor: No weakness.     ED Results / Procedures / Treatments    Labs (all labs ordered are listed, but only abnormal results are displayed) Labs Reviewed  APTT - Abnormal; Notable for the following components:      Result Value   aPTT 23 (*)    All other components within normal limits  CBC - Abnormal; Notable for the following components:   Hemoglobin 11.7 (*)    All other components within normal limits  COMPREHENSIVE METABOLIC PANEL WITH GFR - Abnormal; Notable for the following components:   CO2 18 (*)    All other components within normal limits  I-STAT CHEM 8, ED - Abnormal; Notable for the following components:   TCO2 18 (*)    All other components within normal limits  ETHANOL  PROTIME-INR  DIFFERENTIAL    EKG None  Radiology CT HEAD WO CONTRAST Result Date: 12/28/2023 CLINICAL DATA:  Left foot numbness vomiting EXAM: CT HEAD WITHOUT CONTRAST TECHNIQUE: Contiguous axial images were obtained from the base of the skull through the vertex without intravenous contrast. RADIATION DOSE REDUCTION: This exam was performed according to the departmental dose-optimization program which includes automated exposure control, adjustment of the mA and/or kV according to patient size and/or use of iterative reconstruction technique. COMPARISON:  MRI 08/29/2022, head CT 08/29/2022 FINDINGS: Brain: No acute territorial infarction, hemorrhage or intracranial mass. Mild chronic small vessel ischemic changes of the white matter. Stable ventricle size. Vascular: No hyperdense vessels.  Carotid vascular calcification Skull: Normal. Negative for fracture or focal lesion. Sinuses/Orbits: Opacified left maxillary sinus Other: None IMPRESSION: 1. No CT evidence for acute intracranial abnormality. 2. Mild chronic small vessel ischemic changes of the white matter. 3. Chronic left-sided sinus disease Electronically Signed   By: Esmeralda Hedge M.D.   On: 12/28/2023 20:56    Procedures Procedures    Medications Ordered in ED Medications  oxyCODONE -acetaminophen   (PERCOCET/ROXICET) 5-325 MG per tablet 1 tablet (1 tablet Oral Given 12/28/23 2030)  topiramate  (TOPAMAX ) tablet 50 mg (50 mg Oral Given 12/28/23 2229)    ED Course/ Medical Decision Making/ A&P Clinical Course as of 12/29/23 1515  Wed Dec 28, 2023  2237 Patient's initial complaints were of left foot numbness, new x 2 days, preventing walking, and headache that is recurrent but uncontrolled.  [SU]  2307 The percocet did not provide any relief and she is requesting something more. The patient asked to attempt to ambulate. She then says that is not why she came in, it was because I was shaking. No similar history provided to this provider or her nurse. She reports a history of being on Topamax  for years and recently ran out, has not been able to see her doctor for renewal. Her history and report of  symptoms changes. She no longer requests pain relief. When attempting to ambulate, she declines stating she can't because of the numbness in her foot, which she now states is chronic and longer lasting than the last 2 days.  TOC consult offered for temporary rehab at a facility as no acute process is felt present. She states if she was given Topamax  she could be discharged home. She now states she has an appointment with her doctor tomorrow. Per chart review, primary care note states her topamax  was stopped. Offered a single dose and PCP follow up per scheduled. Patient reports she is comfortable with that and son is contacted by the patient for transportation home. The patient is felt appropriate for discharge hoem.  [SU]    Clinical Course User Index [SU] Mandy Second, PA-C                                 Medical Decision Making Risk Prescription drug management.           Final Clinical Impression(s) / ED Diagnoses Final diagnoses:  Neuropathy  Acute nonintractable headache, unspecified headache type    Rx / DC Orders ED Discharge Orders     None         Mandy Second,  PA-C 12/29/23 1515    Rafael Bun A, DO 01/02/24 1133

## 2023-12-28 NOTE — ED Provider Triage Note (Signed)
 Emergency Medicine Provider Triage Evaluation Note  [name] , a 73 y.o. female  was evaluated in triage.  Pt complains of left foot numbness.  Started at 11 AM today.  She states she tried to get up on her walker and was able to do so because of the numbness in her left foot.  She has not been ambulatory since.  Reports a left-sided hip and pelvis reconstruction that happened a couple years ago but it was revised in the OR 2 months ago.  Also mention that she threw up once a day but denies belly pain.  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  Review of Systems  Positive: See above Negative: See above  Physical Exam  BP (!) 127/91 (BP Location: Right Arm)   Pulse 93   Temp 99.7 F (37.6 C)   Resp (!) 24   Ht 5' 3 (1.6 m)   Wt 54.4 kg   LMP 07/19/2000 (Approximate)   SpO2 96%   BMI 21.26 kg/m  Gen:   Awake, no distress   Resp:  Normal effort  MSK:   Moves extremities without difficulty  Other:    Medical Decision Making  Medically screening exam initiated at 2:58 PM.  Appropriate orders placed.  [name] was informed that the remainder of the evaluation will be completed by another provider, this initial triage assessment does not replace that evaluation, and the importance of remaining in the ED until their evaluation is complete.  Work up started   Janalee Mcmurray, PA-C 12/28/23 1459

## 2023-12-28 NOTE — Discharge Instructions (Signed)
 Keep your appointment with your doctor tomorrow and discuss continuation of your Topamax  with her.

## 2023-12-28 NOTE — Telephone Encounter (Signed)
 Noted

## 2023-12-28 NOTE — ED Triage Notes (Addendum)
 C/O left foot numbness that started at 1100 and head swelling. Pt states she threw up today, pt states she has parasites in skull.

## 2023-12-29 ENCOUNTER — Encounter: Payer: Self-pay | Admitting: Family

## 2023-12-29 ENCOUNTER — Ambulatory Visit: Admitting: Family

## 2023-12-29 ENCOUNTER — Ambulatory Visit (INDEPENDENT_AMBULATORY_CARE_PROVIDER_SITE_OTHER): Admitting: Family

## 2023-12-29 VITALS — BP 120/80 | HR 97 | Temp 98.2°F | Ht 63.0 in | Wt 125.6 lb

## 2023-12-29 DIAGNOSIS — R0609 Other forms of dyspnea: Secondary | ICD-10-CM

## 2023-12-29 DIAGNOSIS — R2681 Unsteadiness on feet: Secondary | ICD-10-CM | POA: Diagnosis not present

## 2023-12-29 DIAGNOSIS — J320 Chronic maxillary sinusitis: Secondary | ICD-10-CM

## 2023-12-29 DIAGNOSIS — G43109 Migraine with aura, not intractable, without status migrainosus: Secondary | ICD-10-CM

## 2023-12-29 DIAGNOSIS — L508 Other urticaria: Secondary | ICD-10-CM | POA: Diagnosis not present

## 2023-12-29 DIAGNOSIS — M21619 Bunion of unspecified foot: Secondary | ICD-10-CM

## 2023-12-29 MED ORDER — TOPIRAMATE 50 MG PO TABS
50.0000 mg | ORAL_TABLET | Freq: Two times a day (BID) | ORAL | 3 refills | Status: AC
Start: 2023-12-29 — End: ?

## 2024-01-03 ENCOUNTER — Ambulatory Visit: Admitting: Family

## 2024-01-06 NOTE — Progress Notes (Signed)
 Provider: Christean Courts FNP-C  Ellasyn Swilling, Elijio Guadeloupe, NP  Patient Care Team: Meldrick Buttery, Elijio Guadeloupe, NP as PCP - General (Family Medicine)  Extended Emergency Contact Information Primary Emergency Contact: Tennison,Peter  [place] Mobile Phone: [phone] Relation: Son Secondary Emergency Contact: Pichard,Christian Mobile Phone: [phone] Relation: Son  Code Status:  Full Code  Goals of care: Advanced Directive information    12/29/2023   11:35 AM  Advanced Directives  Does Patient Have a Medical Advance Directive? No  Would patient like information on creating a medical advance directive? No - Patient declined     Chief Complaint  Patient presents with   Medical Management of Chronic Issues    Discuss meds. And mini stroke.    Discussed the use of AI scribe software for clinical note transcription with the patient, who gave verbal consent to proceed.  History of Present Illness   [name] is a 73 year old female with a history of stroke who presents with symptoms suggestive of a mini-stroke.  She experienced a severe episode characterized by profuse sweating, shortness of breath, vomiting, mumbled speech, and difficulty walking with a heavy foot and stumbling. These symptoms occurred after she stopped taking her medications, Topamax  and Eliquis, following foot surgery.  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 She stopped taking Eliquis about a week ago and also stopped Topamax , which she uses for migraines. She resumed Topamax  after contacting her pharmacy, which helped calm her symptoms temporarily.  She continues to experience shortness of breath, especially when walking, and has not been able to run or engage in physical activities without chest pain for about fifteen years. She mentions having mucus in her mouth upon waking and occasional numbness in her leg, which she  attributes to her foot surgery and bunion issues.  She is currently taking Topamax  50 mg twice a day for migraines, which helps reduce the frequency of her migraines. She prefers to receive her medication on a monthly basis rather than a 90-day supply.  She reports skin issues, describing 'worms coming out of her skin' and experiencing itchiness. She has taken photographs of her skin to show the extent of the issue.   Past Medical History:  Diagnosis Date   ADHD    Anemia    Cellulitis 07/2018   left lower extremity   Chronic kidney disease    surgery to fix   Chronic pain    Depression    major depression   DVT (deep venous thrombosis) (HCC)    Ekbom's delusional parasitosis (HCC)    Fibromyalgia    History of Zenker's diverticulum removal    Lyme disease    Migraine with aura    Multiple falls    Thyroid  disease    hypothyroid   Past Surgical History:  Procedure Laterality Date   APPENDECTOMY  1974   Avascular necrosis of the hip     multiple operations starting in 2005.  bilateral hips   Back fusion  1999   CERVICAL FUSION  3086,5784   EYE SURGERY     eye muscle   HIP SURGERY Left 06/2022   KIDNEY SURGERY  1971   kidney reconstruction   LAMINECTOMY  1985   ORTHOPEDIC SURGERY  11/23/2023   SURGERY IN [place]    TOTAL HIP ARTHROPLASTY      Allergies  Allergen Reactions   Amoxicillin-Pot Clavulanate Nausea And Vomiting and  Other (See Comments)   Clarithromycin Other (See Comments)    DOES NOT REMEMBER REACTION DOES NOT REMEMBER REACTION    Fentanyl Nausea And Vomiting and Other (See Comments)    FENTANYL PATCH FENTANYL PATCH FENTANYL PATCH    Levofloxacin Swelling    Face,eye swelling Face,eye swelling    Lorazepam  Other (See Comments)    DO NOT PUT ANY ATIVAN  ON PATIENT'S IV POST SURGERY! DO NOT PUT ANY ATIVAN  ON PATIENT'S IV POST SURGERY!    Amoxicillin    Doxycycline Nausea And Vomiting   Penicillins Hives   Sulfa Antibiotics Other (See  Comments)    Per pt: unknown    Outpatient Encounter Medications as of 12/29/2023  Medication Sig   buPROPion  HCl ER, XL, 450 MG TB24 TAKE ONE TABLET BY MOUTH EVERY DAY   escitalopram  (LEXAPRO ) 20 MG tablet TAKE ONE TABLET BY MOUTH ONCE DAILY   levothyroxine  (SYNTHROID ) 125 MCG tablet Take 1 tablet (125 mcg total) by mouth daily. APPOINTMENT DUE   methylphenidate  (RITALIN  LA) 20 MG 24 hr capsule TAKE ONE CAPSULE DAILY   methylphenidate  (RITALIN ) 5 MG tablet Take 1 tablet (5 mg total) by mouth daily as needed. Do NOT Prescribe at [facility] to be managed by Psychiatrist.Urine drug test was negative for prescribed medication   ondansetron  (ZOFRAN ) 4 MG tablet Take 4 mg by mouth every 8 (eight) hours as needed for nausea or vomiting.   pantoprazole  (PROTONIX ) 40 MG tablet Take 40 mg by mouth daily.   promethazine  (PHENERGAN ) 12.5 MG suppository Place 1 suppository (12.5 mg total) rectally every 6 (six) hours as needed for nausea or vomiting.   senna (SENOKOT) 8.6 MG tablet Take 1 tablet by mouth daily as needed for constipation.   traMADol  (ULTRAM ) 50 MG tablet Take 1 tablet (50 mg total) by mouth every 6 (six) hours as needed for moderate pain (pain score 4-6).   [DISCONTINUED] topiramate  (TOPAMAX ) 50 MG tablet Take 50 mg by mouth 2 (two) times daily.   PROMETHEGAN 25 MG suppository place ONE-HALF SUPPOSITORY (12.5mg  total) rectally every SIX hours AS NEEDED FOR NAUSEA AND VOMITING   topiramate  (TOPAMAX ) 50 MG tablet Take 1 tablet (50 mg total) by mouth 2 (two) times daily.   No facility-administered encounter medications on file as of 12/29/2023.    Review of Systems  Constitutional:  Negative for appetite change, chills, fatigue, fever and unexpected weight change.  HENT:  Negative for congestion, dental problem, ear discharge, ear pain, facial swelling, hearing loss, nosebleeds, postnasal drip, rhinorrhea, sinus pressure, sinus pain, sneezing, sore throat, tinnitus and trouble swallowing.   Eyes:   Negative for pain, discharge, redness, itching and visual disturbance.  Respiratory:  Negative for cough, chest tightness and wheezing.        Reports shortness of breath  Cardiovascular:  Negative for chest pain, palpitations and leg swelling.  Gastrointestinal:  Negative for abdominal distention, abdominal pain, blood in stool, constipation, diarrhea, nausea and vomiting.  Endocrine: Negative for cold intolerance, heat intolerance, polydipsia, polyphagia and polyuria.  Genitourinary:  Negative for difficulty urinating, dysuria, flank pain, frequency and urgency.  Musculoskeletal:  Positive for arthralgias and gait problem. Negative for back pain, joint swelling, myalgias, neck pain and neck stiffness.  Skin:  Negative for color change, pallor, rash and wound.  Neurological:  Negative for dizziness, syncope, speech difficulty, weakness, light-headedness and numbness.       Migraine headache   Hematological:  Does not bruise/bleed easily.  Psychiatric/Behavioral:  Negative for agitation, behavioral problems, confusion,  hallucinations, self-injury, sleep disturbance and suicidal ideas. The patient is not nervous/anxious.     Immunization History  Administered Date(s) Administered   Influenza, High Dose Seasonal PF 06/09/2018, 09/07/2019, 06/07/2022   Influenza-Unspecified 09/07/2019   PFIZER(Purple Top)SARS-COV-2 Vaccination 11/19/2019, 02/12/2020   Pneumococcal Conjugate-13 08/22/2015   Pneumococcal Polysaccharide-23 11/10/2020   Pertinent  Health Maintenance Due  Topic Date Due   MAMMOGRAM  09/08/2013   Colonoscopy  12/01/2021   INFLUENZA VACCINE  02/17/2024   DEXA SCAN  Completed      09/06/2022    2:13 PM 01/10/2023    3:36 PM 01/26/2023    3:29 PM 12/14/2023   11:22 AM 12/29/2023   11:35 AM  Fall Risk  Falls in the past year? 1 0 1 0 1  Was there an injury with Fall? 0 0 1 0 0  Fall Risk Category Calculator 1 0 3 0 1  Patient at Risk for Falls Due to History of  fall(s);Impaired balance/gait;Impaired mobility No Fall Risks No Fall Risks History of fall(s) Impaired mobility  Fall risk Follow up Falls evaluation completed;Education provided;Falls prevention discussed Falls evaluation completed Falls evaluation completed Falls evaluation completed Falls evaluation completed   Functional Status Survey:    Vitals:   12/29/23 1132  BP: 120/80  Pulse: 97  Temp: 98.2 F (36.8 C)  SpO2: 97%  Weight: 125 lb 9.6 oz (57 kg)  Height: 5' 3 (1.6 m)   Body mass index is 22.25 kg/m. Physical Exam  VITALS: P- 87 GENERAL: Alert, cooperative, well developed, no acute distress HEENT: Normocephalic, normal oropharynx, moist mucous membranes CHEST: Clear to auscultation bilaterally, no wheezes, rhonchi, or crackles CARDIOVASCULAR: Normal heart rate and rhythm, S1 and S2 normal without murmurs ABDOMEN: Soft, non-tender, non-distended, without organomegaly, normal bowel sounds EXTREMITIES: No cyanosis, edema, or swelling NEUROLOGICAL: Cranial nerves grossly intact, moves all extremities without gross motor or sensory deficit  SKIN: No rash,no lesion or erythema   PSYCHIATRY/BEHAVIORAL: Mood stable    Labs reviewed: Recent Labs    01/10/23 1612 05/24/23 1643 12/28/23 1501 12/28/23 1523  NA 143 142 136 142  K 4.4 4.0 4.1 4.0  CL 107 111* 108 108  CO2 27 23 18*  --   GLUCOSE 102* 95 91 91  BUN 16 17 16 16   CREATININE 0.85 0.90 0.88 0.90  CALCIUM 9.6 9.7 9.1  --    Recent Labs    01/10/23 1612 05/24/23 1643 12/28/23 1501  AST 15 14 19   ALT 14 9 15   ALKPHOS  --   --  69  BILITOT 0.5 0.3 0.4  PROT 6.7 7.1 6.8  ALBUMIN  --   --  3.8   Recent Labs    01/10/23 1612 05/24/23 1643 12/28/23 1501 12/28/23 1523  WBC 9.3 6.4 6.2  --   NEUTROABS 7,096 4,173 3.8  --   HGB 13.2 14.3 11.7* 12.6  HCT 40.4 43.6 37.1 37.0  MCV 88.6 88.8 87.7  --   PLT 310 304 342  --    Lab Results  Component Value Date   TSH 0.01 (L) 05/24/2023   Lab Results   Component Value Date   HGBA1C 5.9 (H) 05/24/2023   Lab Results  Component Value Date   CHOL 156 05/24/2023   HDL 64 05/24/2023   LDLCALC 77 05/24/2023   TRIG 70 05/24/2023   CHOLHDL 2.4 05/24/2023    Significant Diagnostic Results in last 30 days:  CT HEAD WO CONTRAST Result Date: 12/28/2023 CLINICAL DATA:  Left foot numbness vomiting EXAM: CT HEAD WITHOUT CONTRAST TECHNIQUE: Contiguous axial images were obtained from the base of the skull through the vertex without intravenous contrast. RADIATION DOSE REDUCTION: This exam was performed according to the departmental dose-optimization program which includes automated exposure control, adjustment of the mA and/or kV according to patient size and/or use of iterative reconstruction technique. COMPARISON:  MRI 08/29/2022, head CT 08/29/2022 FINDINGS: Brain: No acute territorial infarction, hemorrhage or intracranial mass. Mild chronic small vessel ischemic changes of the white matter. Stable ventricle size. Vascular: No hyperdense vessels.  Carotid vascular calcification Skull: Normal. Negative for fracture or focal lesion. Sinuses/Orbits: Opacified left maxillary sinus Other: None IMPRESSION: 1. No CT evidence for acute intracranial abnormality. 2. Mild chronic small vessel ischemic changes of the white matter. 3. Chronic left-sided sinus disease Electronically Signed   By: Esmeralda Hedge M.D.   On: 12/28/2023 20:56    Assessment/Plan Transient Ischemic Attack (TIA) She experienced symptoms suggestive of a TIA, including diaphoresis, dyspnea, emesis, dysarthria, and ataxia. These symptoms resolved after resuming medication but recurred the following morning. She has a stroke history from 2016 and similar past episodes. A CT scan showed no intracranial abnormalities, but mild chronic small vessel ischemia was noted, which is age-related. She was advised to use a cane for ambulation due to leg weakness. She expressed concern about mini-strokes  preceding major strokes, as she has ignored similar episodes in the past. - Prescribe Topamax  50 mg twice daily - Advise use of a cane for ambulation - Order chest x-ray to evaluate dyspnea - Refer to physical therapy for leg strengthening  Shortness of Breath She reports persistent dyspnea, exacerbated by exertion, and a 15-year history of exertional chest pain. The CT scan showed no pulmonary embolism, and the EKG was normal. She also reports waking with oral mucus. - Order chest x-ray to evaluate dyspnea  Migraine She uses Topamax  for migraine prophylaxis and reports rare migraines on the medication. She prefers a monthly supply. - Prescribe Topamax  50 mg twice daily with monthly refills  Chronic Sinusitis Chronic sinus disease was noted on the CT scan. She did not report specific symptoms related to sinusitis during the visit.  Dermatological Condition She reports pruritic skin with white substances described as worms. She has been advised to see a dermatologist for further evaluation and possible skin scraping. - Refer to dermatology for evaluation and possible skin scraping  Bunion and Foot Surgery She is scheduled for bunion surgery on June 27th. She reports leg weakness and difficulty walking, potentially related to the bunion and previous foot surgery. - Proceed with scheduled bunion surgery on June 27th  General Health Maintenance Her lab work, including kidney and liver function tests, electrolytes, and EKG, were normal. She is advised to maintain regular follow-ups and health screenings. - Advise regular follow-ups and health screenings  Follow-up She is scheduled for a six-month follow-up appointment in December. She is advised to follow up with imaging and dermatology as needed. - Schedule six-month follow-up appointment in December - Advise follow-up with [facility] for chest x-ray - Advise follow-up with dermatology           Family/ staff  Communication: Reviewed plan of care with patient  Labs/tests ordered: None   Next Appointment: Return in about 6 months (around 06/29/2024) for medical mangement of chronic issues..   Total time: minutes. Greater than 50% of total time spent doing patient education regarding T2DM,HTN, HLD,chronic back pain,health maintenance including symptom/medication management.   [name]  Rebekah Evens, NP

## 2024-01-18 ENCOUNTER — Other Ambulatory Visit: Payer: Self-pay | Admitting: Family

## 2024-01-18 DIAGNOSIS — F909 Attention-deficit hyperactivity disorder, unspecified type: Secondary | ICD-10-CM

## 2024-01-18 NOTE — Telephone Encounter (Signed)
 Controlled substsance

## 2024-01-19 ENCOUNTER — Other Ambulatory Visit: Payer: Self-pay | Admitting: Adult Health

## 2024-01-19 ENCOUNTER — Other Ambulatory Visit: Payer: Self-pay | Admitting: Family

## 2024-01-19 DIAGNOSIS — F909 Attention-deficit hyperactivity disorder, unspecified type: Secondary | ICD-10-CM

## 2024-01-19 MED ORDER — METHYLPHENIDATE HCL ER (LA) 20 MG PO CP24: 20.0000 mg | ORAL_CAPSULE | Freq: Every day | ORAL | Status: DC

## 2024-01-19 NOTE — Telephone Encounter (Signed)
 Copied from CRM [id number]. Topic: Clinical - Medication Refill >> Jan 19, 2024 11:56 AM Cherylann S wrote: Medication: methylphenidate  (RITALIN  LA) 20 MG 24 hr capsule  Has the patient contacted their pharmacy? Yes (Agent: If no, request that the patient contact the pharmacy for the refill. If patient does not wish to contact the pharmacy document the reason why and proceed with request.) (Agent: If yes, when and what did the pharmacy advise?)  This is the patient's preferred pharmacy:  [facility] - [place], [place] - [address] [place] [address] Phone: [phone] Fax: [phone]  Is this the correct pharmacy for this prescription? Yes If no, delete pharmacy and type the correct one.   Has the prescription been filled recently? No  Is the patient out of the medication? Yes  Has the patient been seen for an appointment in the last year OR does the patient have an upcoming appointment? Yes  Can we respond through MyChart? Yes  Agent: Please be advised that Rx refills may take up to 3 business days. We ask that you follow-up with your pharmacy.

## 2024-01-19 NOTE — Telephone Encounter (Signed)
Called patient and no answer. Voicemail was left with office call back number.   

## 2024-01-19 NOTE — Telephone Encounter (Signed)
 Patient is calling about refill on this medication. Dinah Ngetich, NP has stated in previous drug screening that she will no longer prescribe this medication to patient because the medication wasn't in urine. Patient was suppose to find a Psychiatrist since November 2024. Roxan Plough, NP also placed a referral for patient 12/14/2023. Refill was sent to [facility] yesterday by Jereld Delude, NP. Would you like me to call and cancel this prescription for patient. Message routed to Jereld Delude, NP. Please advise.

## 2024-01-19 NOTE — Telephone Encounter (Signed)
 I have called the pharmacy and they stated that medication was sent in yesterday and put out for delivery to patient. They stated that she should have gotten it yesterday. Message routed to Jereld Delude, NP as RICK.

## 2024-01-26 ENCOUNTER — Other Ambulatory Visit: Payer: Self-pay | Admitting: Family

## 2024-01-26 DIAGNOSIS — F909 Attention-deficit hyperactivity disorder, unspecified type: Secondary | ICD-10-CM

## 2024-01-26 NOTE — Telephone Encounter (Signed)
 Medication pend and sent to Harlene An, NP for denial per documentation of PCP Ngetich, Roxan BROCKS, NP     Roxan BROCKS Plough, NP 05/31/2023 10:47 AM EST  Urine drug test also negative for prescribed medication.Recommend referral to psychiatrist for further management of ADHD medication.will no longer prescribe Ritalin .

## 2024-01-27 ENCOUNTER — Other Ambulatory Visit: Payer: Self-pay | Admitting: Family

## 2024-01-27 DIAGNOSIS — F909 Attention-deficit hyperactivity disorder, unspecified type: Secondary | ICD-10-CM

## 2024-01-27 NOTE — Telephone Encounter (Unsigned)
 Copied from CRM [id number]. Topic: Clinical - Medication Refill >> Jan 27, 2024  4:17 PM Zane F wrote: Patient is calling in stating that she needs a refill of the following prescription not to be confused with her 20 mg prescription as the 5 mg is taken as needed.   Medication: methylphenidate  (RITALIN ) 5 MG tablet  Has the patient contacted their pharmacy? Yes  This is the patient's preferred pharmacy:  [facility] - [place], [place] - [address] [place] [address] Phone: [phone] Fax: [phone]  Is this the correct pharmacy for this prescription? Yes   Has the prescription been filled recently? No  Is the patient out of the medication? Yes  Has the patient been seen for an appointment in the last year OR does the patient have an upcoming appointment? Yes  Can we respond through MyChart? Yes  Agent: Please be advised that Rx refills may take up to 3 business days. We ask that you follow-up with your pharmacy.

## 2024-01-30 NOTE — Telephone Encounter (Signed)
 Medication needs to be refilled with psychiatrist. I am unauthorized to refuse medication.

## 2024-01-31 ENCOUNTER — Telehealth: Payer: Self-pay | Admitting: *Deleted

## 2024-01-31 DIAGNOSIS — F909 Attention-deficit hyperactivity disorder, unspecified type: Secondary | ICD-10-CM

## 2024-01-31 NOTE — Telephone Encounter (Signed)
 Do NOT Prescribe. Refills go through Psychiatrist.   Note added to Rx Request and faxed back to pharmacy.

## 2024-01-31 NOTE — Telephone Encounter (Signed)
 Pharmacy requesting refill.  Epic LR: 12/14/2023 Contract date: 05/24/2023  Pended Rx and sent to [facility] for approval due to Dinah out of office.

## 2024-02-01 ENCOUNTER — Telehealth: Payer: Self-pay | Admitting: Gastroenterology

## 2024-02-01 NOTE — Telephone Encounter (Signed)
 Left message for pt to call back

## 2024-02-01 NOTE — Telephone Encounter (Signed)
 Inbound call from patient stating she has been choking a significant amount, to the point where she feels that she is going to faint. States she is also have a lot of vomiting. Also states she is having a lot of blood in her stool and she is very concerned and does not wish to wait until September to be seen. Requesting a call to discuss further. Please advise, thank you.

## 2024-02-03 NOTE — Telephone Encounter (Signed)
 Left message for pt to call back

## 2024-02-06 NOTE — Telephone Encounter (Signed)
 2 attempts made to reach patient with no return call. Will await further correspondence from patient before continuing to reach out.

## 2024-02-07 ENCOUNTER — Encounter: Payer: Self-pay | Admitting: Family

## 2024-02-07 ENCOUNTER — Ambulatory Visit
Admission: RE | Admit: 2024-02-07 | Discharge: 2024-02-07 | Disposition: A | Discharge status: Home / Self Care | Source: Ambulatory Visit | Attending: Family | Admitting: Family

## 2024-02-07 ENCOUNTER — Ambulatory Visit: Admitting: Family

## 2024-02-07 VITALS — BP 124/72 | HR 89 | Temp 97.9°F | Resp 20 | Ht 63.0 in | Wt 125.6 lb

## 2024-02-07 DIAGNOSIS — R110 Nausea: Secondary | ICD-10-CM

## 2024-02-07 DIAGNOSIS — F909 Attention-deficit hyperactivity disorder, unspecified type: Secondary | ICD-10-CM

## 2024-02-07 DIAGNOSIS — R0609 Other forms of dyspnea: Secondary | ICD-10-CM

## 2024-02-07 DIAGNOSIS — Z1231 Encounter for screening mammogram for malignant neoplasm of breast: Secondary | ICD-10-CM

## 2024-02-07 MED ORDER — ONDANSETRON HCL 4 MG PO TABS: 4.0000 mg | ORAL_TABLET | Freq: Three times a day (TID) | ORAL | 3 refills | Status: AC | PRN

## 2024-02-07 MED ORDER — PROMETHAZINE HCL 12.5 MG RE SUPP: 12.5000 mg | Freq: Four times a day (QID) | RECTAL | 1 refills | Status: AC | PRN

## 2024-02-07 MED ORDER — METHYLPHENIDATE HCL 5 MG PO TABS: 5.0000 mg | ORAL_TABLET | Freq: Every day | ORAL | 0 refills | Status: DC | PRN

## 2024-02-07 NOTE — Patient Instructions (Signed)
 -  Please get chest X-ray at [facility] imaging at [facility] then will call you with results. ? ?

## 2024-02-09 ENCOUNTER — Other Ambulatory Visit: Payer: Self-pay | Admitting: Family

## 2024-02-09 DIAGNOSIS — E039 Hypothyroidism, unspecified: Secondary | ICD-10-CM

## 2024-02-09 NOTE — Telephone Encounter (Signed)
 What sure if was ok to refill. Patient just had this med filled on 01/18/2024 by another provider

## 2024-02-12 NOTE — Progress Notes (Signed)
 Provider: Roxan Plough FNP-C   Babita Amaker, Roxan BROCKS, NP  Patient Care Team: Radford Pease, Roxan BROCKS, NP as PCP - General (Family Medicine)  Extended Emergency Contact Information Primary Emergency Contact: Harjo,Peter  [place] Mobile Phone: [phone] Relation: Son Secondary Emergency Contact: Pichard,Christian Mobile Phone: [phone] Relation: Son  Code Status:  Full Code  Goals of care: Advanced Directive information    12/29/2023   11:35 AM  Advanced Directives  Does Patient Have a Medical Advance Directive? No  Would patient like information on creating a medical advance directive? No - Patient declined     Chief Complaint  Patient presents with   Medical Management of Chronic Issues    Wants to go over health.    Discussed the use of AI scribe software for clinical note transcription with the patient, who gave verbal consent to proceed.  History of Present Illness   [name] is a 73 year old female who presents with issues regarding medication refills and multiple ongoing health concerns.  She is experiencing difficulties in obtaining her medication refills, specifically with her Ritalin  prescriptions. She has had trouble getting her 5 mg Ritalin  filled despite previous discussions. Currently, she has a two-week supply of her 20 mg Ritalin  but requires the 5 mg dose. Additionally, she needs a refill for Phenergan  suppositories and prefers a pill form for convenience when traveling. She also uses Zofran  pills for nausea, especially when traveling.  She is preparing to start a detox program due to feeling 'really sick' and is concerned about her skin condition, describing it as 'horrible' under her arms. 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 She has not yet seen a dermatologist due to long wait times but plans to see a skin doctor in [place] who uses a non-invasive technique.  She has a history of  spine issues, having undergone surgeries in the past, including one in 1998 or 1997 where discs were rebuilt. She recently consulted a neuro spine specialist due to worsening pain in her lower back, which she describes as being 'crushed'. She reports right abdominal pain, which she associates with her spine issues.  She experiences nausea and occasional vomiting, which she attributes to a 'throat situation' where her esophagus gets clogged with 'parasitic stuff'. She is awaiting an appointment for an endoscopy to address this issue. She mentions a decrease in her pulse oximetry readings from 98 to 93 and expresses concern about her lung health, requesting a lung x-ray. She experiences shortness of breath, particularly when walking, and has a history of coughing, which she believes is related to her esophageal issues.  She is also due for a mammogram, which she last had several years ago, and is awaiting a call to schedule it. She mentions a past need for blood thinners post-surgery but is currently managing her health with daily exercises to maintain blood flow.    Past Medical History:  Diagnosis Date   ADHD    Anemia    Cellulitis 07/2018   left lower extremity   Chronic kidney disease    surgery to fix   Chronic pain    Depression    major depression   DVT (deep venous thrombosis) (HCC)    Ekbom's delusional parasitosis (HCC)    Fibromyalgia    History of Zenker's diverticulum removal    Lyme disease    Migraine with aura    Multiple falls    Thyroid  disease  hypothyroid   Past Surgical History:  Procedure Laterality Date   APPENDECTOMY  1974   Avascular necrosis of the hip     multiple operations starting in 2005.  bilateral hips   Back fusion  1999   CERVICAL FUSION  8014,8002   EYE SURGERY     eye muscle   HIP SURGERY Left 06/2022   KIDNEY SURGERY  1971   kidney reconstruction   LAMINECTOMY  1985   ORTHOPEDIC SURGERY  11/23/2023   SURGERY IN [place]    TOTAL HIP  ARTHROPLASTY      Allergies  Allergen Reactions   Amoxicillin-Pot Clavulanate Nausea And Vomiting and Other (See Comments)   Clarithromycin Other (See Comments)    DOES NOT REMEMBER REACTION DOES NOT REMEMBER REACTION    Fentanyl Nausea And Vomiting and Other (See Comments)    FENTANYL PATCH FENTANYL PATCH FENTANYL PATCH    Levofloxacin Swelling    Face,eye swelling Face,eye swelling    Lorazepam  Other (See Comments)    DO NOT PUT ANY ATIVAN  ON PATIENT'S IV POST SURGERY! DO NOT PUT ANY ATIVAN  ON PATIENT'S IV POST SURGERY!    Amoxicillin    Doxycycline Nausea And Vomiting   Penicillins Hives   Sulfa Antibiotics Other (See Comments)    Per pt: unknown    Allergies as of 02/07/2024       Reactions   Amoxicillin-pot Clavulanate Nausea And Vomiting, Other (See Comments)   Clarithromycin Other (See Comments)   DOES NOT REMEMBER REACTION DOES NOT REMEMBER REACTION   Fentanyl Nausea And Vomiting, Other (See Comments)   FENTANYL PATCH FENTANYL PATCH FENTANYL PATCH   Levofloxacin Swelling   Face,eye swelling Face,eye swelling   Lorazepam  Other (See Comments)   DO NOT PUT ANY ATIVAN  ON PATIENT'S IV POST SURGERY! DO NOT PUT ANY ATIVAN  ON PATIENT'S IV POST SURGERY!   Amoxicillin    Doxycycline Nausea And Vomiting   Penicillins Hives   Sulfa Antibiotics Other (See Comments)   Per pt: unknown        Medication List        Accurate as of February 07, 2024 11:59 PM. If you have any questions, ask your nurse or doctor.          STOP taking these medications    Eliquis 5 MG Tabs tablet Generic drug: apixaban Stopped by: Marua Qin C Hadlea Furuya       TAKE these medications    buPROPion  HCl ER (XL) 450 MG Tb24 TAKE ONE TABLET BY MOUTH EVERY DAY   DICLOFENAC  SODIUM PO Take 100 mg by mouth as needed.   escitalopram  20 MG tablet Commonly known as: LEXAPRO  TAKE ONE TABLET BY MOUTH ONCE DAILY   levothyroxine  125 MCG tablet Commonly known as: SYNTHROID  Take 1 tablet  (125 mcg total) by mouth daily. APPOINTMENT DUE   methocarbamol  500 MG tablet Commonly known as: ROBAXIN  Take 500 mg by mouth as needed.   methylphenidate  20 MG 24 hr capsule Commonly known as: RITALIN  LA Take 1 capsule (20 mg total) by mouth daily. NOT PRESCRIBED at [facility]   methylphenidate  5 MG tablet Commonly known as: Ritalin  Take 1 tablet (5 mg total) by mouth daily as needed. Do NOT Prescribe at [facility] to be managed by Psychiatrist.Urine drug test was negative for prescribed medication   ondansetron  4 MG tablet Commonly known as: ZOFRAN  Take 1 tablet (4 mg total) by mouth every 8 (eight) hours as needed for nausea or vomiting.   oxyCODONE  5 MG immediate release tablet Commonly  known as: Oxy IR/ROXICODONE  Take 5 mg by mouth every 8 (eight) hours.   pantoprazole  40 MG tablet Commonly known as: PROTONIX  Take 40 mg by mouth daily.   Promethegan 25 MG suppository Generic drug: promethazine  place ONE-HALF SUPPOSITORY (12.5mg  total) rectally every SIX hours AS NEEDED FOR NAUSEA AND VOMITING   promethazine  12.5 MG suppository Commonly known as: PHENERGAN  Place 1 suppository (12.5 mg total) rectally every 6 (six) hours as needed for nausea or vomiting.   senna 8.6 MG tablet Commonly known as: SENOKOT Take 1 tablet by mouth daily as needed for constipation.   topiramate  50 MG tablet Commonly known as: TOPAMAX  Take 1 tablet (50 mg total) by mouth 2 (two) times daily.   traMADol  50 MG tablet Commonly known as: ULTRAM  Take 1 tablet (50 mg total) by mouth every 6 (six) hours as needed for moderate pain (pain score 4-6).        Review of Systems  Constitutional:  Negative for appetite change, chills, fatigue, fever and unexpected weight change.  HENT:  Negative for congestion, dental problem, ear discharge, ear pain, facial swelling, hearing loss, nosebleeds, postnasal drip, rhinorrhea, sinus pressure, sinus pain, sneezing, sore throat, tinnitus and trouble swallowing.   Eyes:   Negative for pain, discharge, redness, itching and visual disturbance.  Respiratory:  Negative for cough, chest tightness, shortness of breath and wheezing.   Cardiovascular:  Negative for chest pain, palpitations and leg swelling.  Gastrointestinal:  Negative for abdominal distention, abdominal pain, blood in stool, constipation, diarrhea, nausea and vomiting.  Endocrine: Negative for cold intolerance, heat intolerance, polydipsia, polyphagia and polyuria.  Genitourinary:  Negative for difficulty urinating, dysuria, flank pain, frequency and urgency.  Musculoskeletal:  Positive for arthralgias and gait problem. Negative for back pain, joint swelling, myalgias, neck pain and neck stiffness.  Skin:  Negative for color change, pallor, rash and wound.  Neurological:  Negative for dizziness, syncope, speech difficulty, weakness, light-headedness, numbness and headaches.  Hematological:  Does not bruise/bleed easily.  Psychiatric/Behavioral:  Negative for agitation, behavioral problems, confusion, hallucinations, self-injury, sleep disturbance and suicidal ideas. The patient is not nervous/anxious.     Immunization History  Administered Date(s) Administered   Influenza, High Dose Seasonal PF 06/09/2018, 09/07/2019, 06/07/2022   Influenza-Unspecified 09/07/2019   PFIZER(Purple Top)SARS-COV-2 Vaccination 11/19/2019, 02/12/2020   Pneumococcal Conjugate-13 08/22/2015   Pneumococcal Polysaccharide-23 11/10/2020   Pertinent  Health Maintenance Due  Topic Date Due   MAMMOGRAM  09/08/2013   Colonoscopy  12/01/2021   INFLUENZA VACCINE  02/17/2024   DEXA SCAN  Completed      09/06/2022    2:13 PM 01/10/2023    3:36 PM 01/26/2023    3:29 PM 12/14/2023   11:22 AM 12/29/2023   11:35 AM  Fall Risk  Falls in the past year? 1 0 1 0 1  Was there an injury with Fall? 0 0 1 0 0  Fall Risk Category Calculator 1 0 3 0 1  Patient at Risk for Falls Due to History of fall(s);Impaired balance/gait;Impaired  mobility No Fall Risks No Fall Risks History of fall(s) Impaired mobility  Fall risk Follow up Falls evaluation completed;Education provided;Falls prevention discussed Falls evaluation completed Falls evaluation completed Falls evaluation completed Falls evaluation completed   Functional Status Survey:    Vitals:   02/07/24 1353  BP: 124/72  Pulse: 89  Resp: 20  Temp: 97.9 F (36.6 C)  SpO2: 93%  Weight: 125 lb 9.6 oz (57 kg)  Height: 5' 3 (1.6 m)  Body mass index is 22.25 kg/m. Physical Exam  GENERAL: Alert, cooperative, well developed, no acute distress HEENT: Normocephalic, normal oropharynx, moist mucous membranes CHEST: Clear to auscultation bilaterally, no wheezes, rhonchi, or crackles CARDIOVASCULAR: Normal heart rate and rhythm, S1 and S2 normal without murmurs ABDOMEN: Soft, non-tender, non-distended, without organomegaly, normal bowel sounds EXTREMITIES: No cyanosis or edema NEUROLOGICAL: Cranial nerves grossly intact, moves all extremities without gross motor or sensory deficit unsteady gait      Labs reviewed: Recent Labs    05/24/23 1643 12/28/23 1501 12/28/23 1523  NA 142 136 142  K 4.0 4.1 4.0  CL 111* 108 108  CO2 23 18*  --   GLUCOSE 95 91 91  BUN 17 16 16   CREATININE 0.90 0.88 0.90  CALCIUM 9.7 9.1  --    Recent Labs    05/24/23 1643 12/28/23 1501  AST 14 19  ALT 9 15  ALKPHOS  --  69  BILITOT 0.3 0.4  PROT 7.1 6.8  ALBUMIN  --  3.8   Recent Labs    05/24/23 1643 12/28/23 1501 12/28/23 1523  WBC 6.4 6.2  --   NEUTROABS 4,173 3.8  --   HGB 14.3 11.7* 12.6  HCT 43.6 37.1 37.0  MCV 88.8 87.7  --   PLT 304 342  --    Lab Results  Component Value Date   TSH 0.01 (L) 05/24/2023   Lab Results  Component Value Date   HGBA1C 5.9 (H) 05/24/2023   Lab Results  Component Value Date   CHOL 156 05/24/2023   HDL 64 05/24/2023   LDLCALC 77 05/24/2023   TRIG 70 05/24/2023   CHOLHDL 2.4 05/24/2023    Significant Diagnostic Results  in last 30 days:  No results found.  Assessment/Plan  ADHD - request Ritalin  5 mg tablet refill. - recommend establish with psych service   Spinal Issues Experiencing worsening lower back pain with disc misalignment and compression. Scheduled for neurosurgical evaluation and potential surgery to address pain and structural issues. Neurosurgery prioritized over foot surgery to improve mobility. - Proceed with neurosurgical consultation and potential surgery.  Foot Surgery Scheduled for foot surgery involving debridement and metal plate placement. Postoperative plan includes six weeks of non-weight bearing. Foot surgery deferred until after neurosurgery for optimal recovery. - Proceed with scheduled foot surgery post-neurosurgery.  Nausea and Esophageal Issues Experiencing nausea and esophageal obstruction due to parasitic material. Plans for specialist consultation and endoscopy. - Proceed with endoscopy for esophageal evaluation.  Shortness of Breath Reports dyspnea, particularly with ambulation, associated with parasitic infections. Plans for lung x-ray to assess pulmonary status. - Order lung x-ray at [facility].  Eczema New onset eczema, suspected to be related to parasitic infections. Plans to consult a dermatologist using non-invasive techniques. - Schedule appointment with dermatologist for eczema evaluation.  Medication Refill Issues Difficulties obtaining timely refills for Ritalin  and Phenergan . Requires 5 mg Ritalin  and prefers Phenergan  in pill form for travel convenience. - Approve refill for 5 mg Ritalin . - Approve refill for Phenergan  in pill form.  General Health Maintenance Due for mammogram, last performed several years ago. Interested in maintaining exercise routine to prevent postoperative thromboembolism. - Order mammogram at [facility]. - Encourage continuation of daily exercises.  Follow-up Multiple follow-up appointments and procedures  planned, including neurosurgery, foot surgery, and dermatology consultation. - Follow up with neurosurgeon for spinal issues. - Follow up with dermatologist for eczema. - Follow up with specialist for endoscopy. - Schedule mammogram and lung  x-ray appointments.   Family/ staff Communication: Reviewed plan of care with patient verbalized understanding   Labs/tests ordered: - DG chest  - mammogram  Next Appointment : Return if symptoms worsen or fail to improve.   Spent 30 minutes of Face to face and non-face to face with patient  >50% time spent counseling; reviewing medical record; tests; labs; documentation and developing future plan of care.   Roxan JAYSON Plough, NP

## 2024-02-13 ENCOUNTER — Ambulatory Visit: Payer: Self-pay | Admitting: Family

## 2024-02-15 ENCOUNTER — Other Ambulatory Visit: Payer: Self-pay | Admitting: Orthopedic Surgery

## 2024-02-15 ENCOUNTER — Other Ambulatory Visit: Payer: Self-pay | Admitting: Family

## 2024-02-15 NOTE — Telephone Encounter (Signed)
 Pharmacy requested refill.  ?Pended Rx and sent to [facility] for approval.  ?

## 2024-02-24 ENCOUNTER — Telehealth: Payer: Self-pay

## 2024-02-24 DIAGNOSIS — F909 Attention-deficit hyperactivity disorder, unspecified type: Secondary | ICD-10-CM

## 2024-02-24 MED ORDER — METHYLPHENIDATE HCL ER (LA) 20 MG PO CP24: 20.0000 mg | ORAL_CAPSULE | Freq: Every day | ORAL | 0 refills | Status: DC

## 2024-02-24 NOTE — Telephone Encounter (Signed)
 Notes- in November of 2024 it was documented on UDS (urine drug screen) that we would no longer prescribe Ritlan, however Monina Fredda Delude, NP prescribed last month.  Left message on voicemail for patient to return call when available, reason for call: Inform patient that rx should come from specialist

## 2024-02-24 NOTE — Telephone Encounter (Signed)
 Patient returned call and stated at last visit [name] changed her mind and told her she would prescribe

## 2024-02-24 NOTE — Telephone Encounter (Signed)
 Copied from CRM [id number]. Topic: Clinical - Medication Question >> Feb 24, 2024 10:22 AM Cherylann RAMAN wrote: Reason for CRM: Patient called stating that her pharmacy will be calling to submit a refill for her methylphenidate  (RITALIN  LA) 20 MG 24 hr capsule. Provider Roxan has approved the refill request for the medication in the last appt. Please contact patient at [phone] for additional information.

## 2024-02-24 NOTE — Telephone Encounter (Signed)
 Patient has not established with psych yet

## 2024-02-24 NOTE — Telephone Encounter (Signed)
 Medication was prescribed last visit while patient was supposed to establish with psychiatrist.

## 2024-03-09 ENCOUNTER — Telehealth: Payer: Self-pay | Admitting: Gastroenterology

## 2024-03-09 NOTE — Telephone Encounter (Signed)
 Left message for patient to call back

## 2024-03-09 NOTE — Telephone Encounter (Signed)
 Patient states that she has been choking on her saliva and is requesting to speak to a nurse.

## 2024-03-12 NOTE — Telephone Encounter (Signed)
 Left message for patient to call back

## 2024-03-13 ENCOUNTER — Other Ambulatory Visit: Payer: Self-pay | Admitting: Family

## 2024-03-13 DIAGNOSIS — F909 Attention-deficit hyperactivity disorder, unspecified type: Secondary | ICD-10-CM

## 2024-03-13 NOTE — Telephone Encounter (Signed)
 2 attempts have been made to speak with patient regarding her concerns. Will await further correspondence from patient before continuing efforts.

## 2024-03-13 NOTE — Telephone Encounter (Signed)
 Patient is requesting a refill of the following medications: Requested Prescriptions   Pending Prescriptions Disp Refills   RITALIN  5 MG tablet [Pharmacy Med Name: Ritalin  5 mg tablet] 30 tablet 0    Sig: TAKE ONE TABLET DAILY AS NEEDED    Date of last refill: 02/07/24  Refill amount: 0  Treatment agreement date: 05/24/23

## 2024-03-21 ENCOUNTER — Other Ambulatory Visit: Payer: Self-pay | Admitting: Orthopaedic Surgery

## 2024-03-21 DIAGNOSIS — Z96641 Presence of right artificial hip joint: Secondary | ICD-10-CM

## 2024-03-21 NOTE — Telephone Encounter (Signed)
 Left message for patient to call back

## 2024-03-21 NOTE — Progress Notes (Unsigned)
 Ellouise Console, PA-C [address] [place], [place]  [address] Phone: [phone]   Primary Care Physician: Leonarda Roxan BROCKS, NP  Primary Gastroenterologist:  Ellouise Console, PA-C /Dr. Stacia  Chief Complaint: Dysphagia; Hx gastric ulcers; Hx Barrett's; Repeat EGD       HPI:   [name] is a 73 y.o. female, established patient of Dr. Stacia, presents for recurrent worsening chronic solid food dysphagia.  She is here today with her son.  Current symptoms: Patient reports food sticking in her mid chest for several months.  She reports having acid reflux, and vomiting food and mucus.  She takes pantoprazole  40 mg once per week sporadically as needed.  She has not been consistently taking her PPI.  She denies abdominal pain, bowel irregularities, or rectal bleeding.  03/29/2022 last EGD by Dr. Stacia: 5 cm hiatal hernia.  Torturous lower esophagus.  Concern for short segment Barrett's.  Benign appearing mild distal esophageal stenosis dilated to 17 mm balloon dilator.  Hematin in the stomach.  Many nonbleeding superficial gastric ulcers.  Mild duodenitis without bleeding.  Biopsies positive for Barrett's.  Negative for H. Pylori and dysplasia.  She was treated with omeprazole  40 Mg daily plus sucralfate  1 g 4 times daily.  Repeat EGD recommended in 8 weeks (is overdue).  10/2013 EGD with esophageal dilation.  11/2011 Last colonoscopy: Sigmoid diverticulosis, otherwise normal with no polyps.  10-year repeat (was due 11/2021).  PMH: Ekbom's delusional parasitosis, history of failed left hip arthroplasty, depression, ADHD, Barrett's esophagus, esophageal stricture, hiatal hernia, Hx gastric ulcers (H. pylori negative), Zenker's diverticulum, cricopharyngeal bar, fibromyalgia, chronic pain, multiple allergies, migraines.  Last hip surgery 11/2023 by Dr. Ozell Rogue, orthopedic surgeon in [place] .    Current Outpatient Medications  Medication Sig Dispense Refill    buPROPion  HCl ER, XL, 450 MG TB24 TAKE ONE TABLET BY MOUTH EVERY DAY 90 tablet 1   Diclofenac  Sodium CR 100 MG 24 hr tablet TAKE ONE TABLET DAILY WITH FOOD 30 tablet 3   DICLOFENAC  SODIUM PO Take 100 mg by mouth as needed.     escitalopram  (LEXAPRO ) 20 MG tablet TAKE ONE TABLET BY MOUTH ONCE DAILY 90 tablet 1   levothyroxine  (SYNTHROID ) 125 MCG tablet TAKE ONE TABLET BY MOUTH DAILY 30 tablet 3   methocarbamol  (ROBAXIN ) 500 MG tablet TAKE 1 OR 2 TABLETS AS NEEDED FOR MUSCLE SPASM 30 tablet 1   methylphenidate  (RITALIN  LA) 20 MG 24 hr capsule Take 1 capsule (20 mg total) by mouth daily. 30 capsule 0   ondansetron  (ZOFRAN ) 4 MG tablet Take 1 tablet (4 mg total) by mouth every 8 (eight) hours as needed for nausea or vomiting. 20 tablet 3   oxyCODONE  (OXY IR/ROXICODONE ) 5 MG immediate release tablet Take 5 mg by mouth every 8 (eight) hours.     promethazine  (PHENERGAN ) 12.5 MG suppository Place 1 suppository (12.5 mg total) rectally every 6 (six) hours as needed for nausea or vomiting. 12 each 1   PROMETHEGAN 25 MG suppository place ONE-HALF SUPPOSITORY (12.5mg  total) rectally every SIX hours AS NEEDED FOR NAUSEA AND VOMITING 6 suppository 0   RITALIN  5 MG tablet TAKE ONE TABLET DAILY AS NEEDED 30 tablet 0   senna (SENOKOT) 8.6 MG tablet Take 1 tablet by mouth daily as needed for constipation.     topiramate  (TOPAMAX ) 50 MG tablet Take 1 tablet (50 mg total) by mouth 2 (two) times daily. 60 tablet 3   traMADol  (ULTRAM ) 50 MG  tablet Take 1 tablet (50 mg total) by mouth every 6 (six) hours as needed for moderate pain (pain score 4-6). 60 tablet 3   pantoprazole  (PROTONIX ) 40 MG tablet Take 1 tablet (40 mg total) by mouth daily. 90 tablet 3   No current facility-administered medications for this visit.    Allergies as of 03/22/2024 - Review Complete 03/22/2024  Allergen Reaction Noted   Amoxicillin-pot clavulanate Nausea And Vomiting and Other (See Comments) 03/30/2011   Clarithromycin Other (See  Comments) 03/30/2011   Fentanyl Nausea And Vomiting and Other (See Comments) 03/30/2011   Levofloxacin Swelling 03/02/2011   Lorazepam  Other (See Comments) 03/02/2011   Amoxicillin  01/25/2022   Doxycycline Nausea And Vomiting 10/01/2011   Penicillins Hives 10/01/2011   Sulfa antibiotics Other (See Comments) 10/11/2011    Past Medical History:  Diagnosis Date   ADHD    Anemia    Cellulitis 07/2018   left lower extremity   Chronic kidney disease    surgery to fix   Chronic pain    Depression    major depression   DVT (deep venous thrombosis) (HCC)    Ekbom's delusional parasitosis (HCC)    Fibromyalgia    History of Zenker's diverticulum removal    Lyme disease    Migraine with aura    Multiple falls    Thyroid  disease    hypothyroid    Past Surgical History:  Procedure Laterality Date   APPENDECTOMY  1974   Avascular necrosis of the hip     multiple operations starting in 2005.  bilateral hips   Back fusion  1999   CERVICAL FUSION  8014,8002   EYE SURGERY     eye muscle   HIP SURGERY Left 06/2022   KIDNEY SURGERY  1971   kidney reconstruction   LAMINECTOMY  1985   ORTHOPEDIC SURGERY  11/23/2023   SURGERY IN [place]    TOTAL HIP ARTHROPLASTY      Review of Systems:    All systems reviewed and negative except where noted in HPI.    Physical Exam:  BP 110/70   Pulse 84   Ht 5' 3 (1.6 m)   Wt 132 lb (59.9 kg)   LMP 07/19/2000 (Approximate)   SpO2 98%   BMI 23.38 kg/m  Patient's last menstrual period was 07/19/2000 (approximate).  General: Well-nourished, well-developed in no acute distress.  Walks with a walker. Lungs: Clear to auscultation bilaterally. Non-labored. Heart: Regular rate and rhythm, no murmurs rubs or gallops.  Abdomen: Bowel sounds are normal; Abdomen is Soft; No hepatosplenomegaly, masses or hernias;  No Abdominal Tenderness; No guarding or rebound tenderness. Neuro: Alert and oriented x 3.  Grossly intact.  Psych: Alert and  cooperative, normal mood and affect.   Imaging Studies: No results found.  Labs: CBC    Component Value Date/Time   WBC 6.2 12/28/2023 1501   RBC 4.23 12/28/2023 1501   HGB 12.6 12/28/2023 1523   HCT 37.0 12/28/2023 1523   PLT 342 12/28/2023 1501   MCV 87.7 12/28/2023 1501   MCV 84.9 10/01/2011 1243   MCH 27.7 12/28/2023 1501   MCHC 31.5 12/28/2023 1501   RDW 13.5 12/28/2023 1501   LYMPHSABS 1.2 12/28/2023 1501   MONOABS 0.7 12/28/2023 1501   EOSABS 0.4 12/28/2023 1501   BASOSABS 0.1 12/28/2023 1501    CMP     Component Value Date/Time   NA 142 12/28/2023 1523   K 4.0 12/28/2023 1523   CL 108 12/28/2023 1523  CO2 18 (L) 12/28/2023 1501   GLUCOSE 91 12/28/2023 1523   BUN 16 12/28/2023 1523   CREATININE 0.90 12/28/2023 1523   CREATININE 0.90 05/24/2023 1643   CALCIUM 9.1 12/28/2023 1501   PROT 6.8 12/28/2023 1501   ALBUMIN 3.8 12/28/2023 1501   AST 19 12/28/2023 1501   ALT 15 12/28/2023 1501   ALKPHOS 69 12/28/2023 1501   BILITOT 0.4 12/28/2023 1501   GFRNONAA >60 12/28/2023 1501   GFRNONAA 68 12/24/2020 1019   GFRAA 79 12/24/2020 1019       Assessment and Plan:   [name] is a 73 y.o. y/o female presents for:  1.  Chronic Recurrent Worsening Solid Food Dysphagia - Scheduling EGD with Dilation I discussed risks of EGD w/ DIL with patient to include risk of bleeding, perforation, and risk of sedation.  Patient expressed understanding and agrees to proceed with EGD.   2.  History of esophageal stricture, hiatal hernia, torturous esophagus  3.  History of gastric ulcers (last EGD 03/2022 H. pylori negative) - Continue PPI: Advised her to take Pantoprazole  40mg  1 tablet every day. - Avoid NSAIDs - Repeat EGD  4.  Barrett's esophagus /chronic GERD - Schedule repeat EGD-  - Refilled pantoprazole  40mg  1 tablet daily every daily, #90, 3 RF.  5.  Colon cancer screening is Overdue: Last colonoscopy in 2013 was negative. - Ordered Cologuard. - Patient  declined Colonoscopy.  Ellouise Console, PA-C  Follow up in 3 months with DR. Cunningham for Dysphagia.

## 2024-03-21 NOTE — Telephone Encounter (Addendum)
 Inbound call from patient requesting a call back regarding previous notes. States she is choking, vomiting, and unable to breathe. States she is scared. Patient is requesting a urgent call back. Please advise, thank you

## 2024-03-21 NOTE — Telephone Encounter (Signed)
 Ms.Six called back and states that she was supposed to have repeat endoscopy some time ago but was never able to get this completed. States that she has quite a bit of difficulty with choking episodes as well as pill dysphagia. Does have some vomiting at times. She has scheduled an appointment with Ellouise Console, PA-C for tomorrow, 03/22/24 at 11 am for further evaluation/instruction. Patient agrees to this plan.

## 2024-03-22 ENCOUNTER — Encounter: Payer: Self-pay | Admitting: Physician Assistant

## 2024-03-22 ENCOUNTER — Ambulatory Visit (INDEPENDENT_AMBULATORY_CARE_PROVIDER_SITE_OTHER): Admitting: Physician Assistant

## 2024-03-22 VITALS — BP 110/70 | HR 84 | Ht 63.0 in | Wt 132.0 lb

## 2024-03-22 DIAGNOSIS — K2270 Barrett's esophagus without dysplasia: Secondary | ICD-10-CM | POA: Diagnosis not present

## 2024-03-22 DIAGNOSIS — Z8711 Personal history of peptic ulcer disease: Secondary | ICD-10-CM | POA: Diagnosis not present

## 2024-03-22 DIAGNOSIS — K2100 Gastro-esophageal reflux disease with esophagitis, without bleeding: Secondary | ICD-10-CM

## 2024-03-22 DIAGNOSIS — K219 Gastro-esophageal reflux disease without esophagitis: Secondary | ICD-10-CM | POA: Diagnosis not present

## 2024-03-22 DIAGNOSIS — R1310 Dysphagia, unspecified: Secondary | ICD-10-CM

## 2024-03-22 DIAGNOSIS — Z1211 Encounter for screening for malignant neoplasm of colon: Secondary | ICD-10-CM

## 2024-03-22 DIAGNOSIS — Z8719 Personal history of other diseases of the digestive system: Secondary | ICD-10-CM

## 2024-03-22 MED ORDER — PANTOPRAZOLE SODIUM 40 MG PO TBEC
40.0000 mg | DELAYED_RELEASE_TABLET | Freq: Every day | ORAL | 3 refills | Status: AC
Start: 2024-03-22 — End: ?

## 2024-03-22 NOTE — Progress Notes (Signed)
 Agree with the assessment and plan as outlined by Ellouise Console, PA-C.  Guidelines do not currently recommend any antibiotic prophylaxis for endoscopic procedures in patient's with prost

## 2024-03-22 NOTE — Patient Instructions (Addendum)
 TAKE PANTOPRAZOLE  EVERYDAY !!!!!  Your provider has ordered Cologuard testing as an option for colon cancer screening. This is performed by [employer] and may be out of network with your insurance. PRIOR to completing the test, it is YOUR responsibility to contact your insurance about covered benefits for this test. Your out of pocket expense could be anywhere from $0.00 to $649.00.   When you call to check coverage with your insurer, please provide the following information:   -The ONLY provider of Cologuard is Optician, dispensing  - CPT code for Cologuard is [phone].  [facility] NPI # [id number]  -[employer] Tax ID # [id number]   We have already sent your demographic and insurance information to [employer] (phone number [phone]) and they should contact you within the next week regarding your test. If you have not heard from them within the next week, please call our office at [phone].  You have been scheduled for an Endoscopy. Please follow written instructions given to you at your visit today.  If you use inhalers (even only as needed), please bring them with you on the day of your procedure.  If you take any of the following medications, they will need to be adjusted prior to your procedure:   DO NOT TAKE 7 DAYS PRIOR TO TEST- Trulicity (dulaglutide) Ozempic, Wegovy (semaglutide) Mounjaro (tirzepatide) Bydureon Bcise (exanatide extended release)  DO NOT TAKE 1 DAY PRIOR TO YOUR TEST Rybelsus (semaglutide) Adlyxin (lixisenatide) Victoza (liraglutide) Byetta (exanatide) ___________________________________________________________________________  Please follow up sooner if symptoms increase or worsen  Due to recent changes in healthcare laws, you may see the results of your imaging and laboratory studies on MyChart before your provider has had a chance to review them.  We understand that in some cases there may be results  that are confusing or concerning to you. Not all laboratory results come back in the same time frame and the provider may be waiting for multiple results in order to interpret others.  Please give us  48 hours in order for your provider to thoroughly review all the results before contacting the office for clarification of your results.   Thank you for trusting me with your gastrointestinal care!   Ellouise Console, PA-C _______________________________________________________  If your blood pressure at your visit was 140/90 or greater, please contact your primary care physician to follow up on this.  _______________________________________________________  If you are age 75 or older, your body mass index should be between 23-30. Your Body mass index is 23.38 kg/m. If this is out of the aforementioned range listed, please consider follow up with your Primary Care Provider.  If you are age 16 or younger, your body mass index should be between 19-25. Your Body mass index is 23.38 kg/m. If this is out of the aformentioned range listed, please consider follow up with your Primary Care Provider.   ________________________________________________________  The [facility] providers would like to encourage you to use MYCHART to communicate with providers for non-urgent requests or questions.  Due to long hold times on the telephone, sending your provider a message by [facility] may be a faster and more efficient way to get a response.  Please allow 48 business hours for a response.  Please remember that this is for non-urgent requests.  _______________________________________________________

## 2024-03-23 NOTE — Progress Notes (Signed)
 Agree with the assessment and plan as outlined by Ellouise Console, PA-C.  Current guidelines do not recommend antibiotic prophylaxis for patients with prostheses undergoing endoscopic procedures.  Ok to proceed with EGD/dilation without antibiotics from my standpoint

## 2024-03-26 ENCOUNTER — Telehealth: Payer: Self-pay | Admitting: *Deleted

## 2024-03-26 NOTE — Telephone Encounter (Signed)
 Left message advising patient that antibiotics are not needed prior to Endoscopy as most current guidelines do not recommend this. Advised she may move forward with procedure as currently scheduled.

## 2024-03-26 NOTE — Telephone Encounter (Signed)
-----   Message from Ellouise Console sent at 03/25/2024 10:10 AM EDT ----- Notify patient she does NOT need antibiotics before her EGD with Dilation procedure. Ellouise Console, PA-C ----- Message ----- From: Stacia Glendia BRAVO, MD Sent: 03/23/2024   8:04 AM EDT To: Ellouise Console, PA-C

## 2024-03-27 ENCOUNTER — Ambulatory Visit

## 2024-03-27 ENCOUNTER — Ambulatory Visit
Admission: RE | Admit: 2024-03-27 | Discharge: 2024-03-27 | Disposition: A | Discharge status: Home / Self Care | Source: Ambulatory Visit | Attending: Orthopaedic Surgery | Admitting: Orthopaedic Surgery

## 2024-03-27 DIAGNOSIS — Z96641 Presence of right artificial hip joint: Secondary | ICD-10-CM

## 2024-03-28 ENCOUNTER — Ambulatory Visit: Admission: RE | Admit: 2024-03-28 | Source: Ambulatory Visit

## 2024-04-06 ENCOUNTER — Other Ambulatory Visit: Payer: Self-pay | Admitting: Family

## 2024-04-13 ENCOUNTER — Ambulatory Visit (AMBULATORY_SURGERY_CENTER): Admitting: Gastroenterology

## 2024-04-13 ENCOUNTER — Other Ambulatory Visit: Payer: Self-pay | Admitting: Family

## 2024-04-13 ENCOUNTER — Encounter: Payer: Self-pay | Admitting: Gastroenterology

## 2024-04-13 VITALS — BP 123/68 | HR 74 | Temp 97.5°F | Resp 20 | Ht 63.0 in | Wt 132.0 lb

## 2024-04-13 DIAGNOSIS — K2950 Unspecified chronic gastritis without bleeding: Secondary | ICD-10-CM

## 2024-04-13 DIAGNOSIS — Q399 Congenital malformation of esophagus, unspecified: Secondary | ICD-10-CM | POA: Diagnosis not present

## 2024-04-13 DIAGNOSIS — F909 Attention-deficit hyperactivity disorder, unspecified type: Secondary | ICD-10-CM

## 2024-04-13 DIAGNOSIS — K254 Chronic or unspecified gastric ulcer with hemorrhage: Secondary | ICD-10-CM | POA: Diagnosis not present

## 2024-04-13 DIAGNOSIS — K2270 Barrett's esophagus without dysplasia: Secondary | ICD-10-CM

## 2024-04-13 DIAGNOSIS — R1310 Dysphagia, unspecified: Secondary | ICD-10-CM

## 2024-04-13 DIAGNOSIS — K449 Diaphragmatic hernia without obstruction or gangrene: Secondary | ICD-10-CM

## 2024-04-13 DIAGNOSIS — K219 Gastro-esophageal reflux disease without esophagitis: Secondary | ICD-10-CM

## 2024-04-13 MED ORDER — PANTOPRAZOLE SODIUM 40 MG PO TBEC: 40.0000 mg | DELAYED_RELEASE_TABLET | Freq: Two times a day (BID) | ORAL | 3 refills | Status: AC

## 2024-04-13 MED ORDER — SODIUM CHLORIDE 0.9 % IV SOLN: 500.0000 mL | Freq: Once | INTRAVENOUS | Status: DC

## 2024-04-13 NOTE — Progress Notes (Signed)
 Sedate, gd SR, tolerated procedure well, VSS, report to RN

## 2024-04-13 NOTE — Progress Notes (Signed)
 Called to room to assist during endoscopic procedure.  Patient ID and intended procedure confirmed with present staff. Received instructions for my participation in the procedure from the performing physician.

## 2024-04-13 NOTE — Op Note (Signed)
 [facility] Patient Name: [name] Procedure Date: 04/13/2024 3:42 PM MRN: [id number] Endoscopist: Glendia E. Stacia , MD, [id number] Age: 73 Referring MD:  Date of Birth: 26-Dec-1950 Gender: Female Account #: [id number] Procedure:                Upper GI endoscopy Indications:              Dysphagia, Esophageal reflux symptoms that persist                            despite appropriate therapy Medicines:                Monitored Anesthesia Care Procedure:                Pre-Anesthesia Assessment:                           - Prior to the procedure, a History and Physical                            was performed, and patient medications and                            allergies were reviewed. The patient's tolerance of                            previous anesthesia was also reviewed. The risks                            and benefits of the procedure and the sedation                            options and risks were discussed with the patient.                            All questions were answered, and informed consent                            was obtained. Prior Anticoagulants: The patient has                            taken no anticoagulant or antiplatelet agents. ASA                            Grade Assessment: III - A patient with severe                            systemic disease. After reviewing the risks and                            benefits, the patient was deemed in satisfactory                            condition to undergo the procedure.  After obtaining informed consent, the endoscope was                            passed under direct vision. Throughout the                            procedure, the patient's blood pressure, pulse, and                            oxygen saturations were monitored continuously. The                            GIF [id number] #[id number] was introduced through the                            mouth, and advanced  to the second part of duodenum.                            The upper GI endoscopy was accomplished without                            difficulty. The patient tolerated the procedure                            well. Scope In: Scope Out: Findings:                 The lower third of the esophagus was significantly                            tortuous.                           The Z-line was irregular and was found 30 cm from                            the incisors. Biopsies were taken with a cold                            forceps for histology. Estimated blood loss was                            minimal.                           The exam of the esophagus was otherwise normal.                           Many superficial gastric ulcers were found in the                            gastric body and proximal antrum. There was some                            fresh blood in the stomach and adherent  clots with                            some of the small ulcers. . Biopsies were taken                            with a cold forceps for Helicobacter pylori                            testing. Estimated blood loss was minimal.                           The exam of the stomach was otherwise normal.                           A 5 cm hiatal hernia was present.                           The examined duodenum was normal. Complications:            No immediate complications. Estimated Blood Loss:     Estimated blood loss was minimal. Impression:               - Tortuous, foreshortened esophagus seondary to                            hiatal hernia. No significant esophageal stricture                            or stenosis appreciated.                           - Z-line irregular, 30 cm from the incisors, but no                            obvious Barrett's esophagus. Biopsied.                           - Small, superficial gastric ulcers with recent                            bleeding/adherent clot. Biopsied  to exclude H.                            pylori. These are likely related to NSAIDs.                           - 5 cm hiatal hernia.                           - Normal examined duodenum. Recommendation:           - Patient has a contact number available for                            emergencies. The signs and symptoms of potential  delayed complications were discussed with the                            patient. Return to normal activities tomorrow.                            Written discharge instructions were provided to the                            patient.                           - Resume previous diet.                           - Use Protonix  (pantoprazole ) 40 mg PO BID for 8                            weeks, then continue once daily indefinitely.                           - Await pathology results.                           - Repeat upper endoscopy in 8 weeks to check                            healing of gastric ulceration.                           - Avoid NSAIDs [name] E. Stacia, MD 04/13/2024 4:13:49 PM This report has been signed electronically.

## 2024-04-13 NOTE — Patient Instructions (Addendum)
 Resume previous diet.                           - Use Protonix  (pantoprazole ) 40 mg PO BID for 8                            weeks, then continue once daily indefinitely.                           - Await pathology results.                           - Repeat upper endoscopy in 8 weeks to check                            healing of gastric ulceration.                           - Avoid NSAIDs    YOU HAD AN ENDOSCOPIC PROCEDURE TODAY AT THE [facility]:   Refer to the procedure report that was given to you for any specific questions about what was found during the examination.  If the procedure report does not answer your questions, please call your gastroenterologist to clarify.  If you requested that your care partner not be given the details of your procedure findings, then the procedure report has been included in a sealed envelope for you to review at your convenience later.  YOU SHOULD EXPECT: Some feelings of bloating in the abdomen. Passage of more gas than usual.  Walking can help get rid of the air that was put into your GI tract during the procedure and reduce the bloating. If you had a lower endoscopy (such as a colonoscopy or flexible sigmoidoscopy) you may notice spotting of blood in your stool or on the toilet paper. If you underwent a bowel prep for your procedure, you may not have a normal bowel movement for a few days.  Please Note:  You might notice some irritation and congestion in your nose or some drainage.  This is from the oxygen used during your procedure.  There is no need for concern and it should clear up in a day or so.  SYMPTOMS TO REPORT IMMEDIATELY:   Following upper endoscopy (EGD)  Vomiting of blood or coffee ground material  New chest pain or pain under the shoulder blades  Painful or persistently difficult swallowing  New shortness of breath  Fever of 100F or higher  Black, tarry-looking stools  For urgent or emergent issues, a  gastroenterologist can be reached at any hour by calling [phone]. Do not use MyChart messaging for urgent concerns.    DIET:  We do recommend a small meal at first, but then you may proceed to your regular diet.  Drink plenty of fluids but you should avoid alcoholic beverages for 24 hours.  ACTIVITY:  You should plan to take it easy for the rest of today and you should NOT DRIVE or use heavy machinery until tomorrow (because of the sedation medicines used during the test).    FOLLOW UP: Our staff will call the number listed on your records the next business day following your procedure.  We will call around 7:15- 8:00 am to check on you  and address any questions or concerns that you may have regarding the information given to you following your procedure. If we do not reach you, we will leave a message.     If any biopsies were taken you will be contacted by phone or by letter within the next 1-3 weeks.  Please call us  at [phone] if you have not heard about the biopsies in 3 weeks.    SIGNATURES/CONFIDENTIALITY: You and/or your care partner have signed paperwork which will be entered into your electronic medical record.  These signatures attest to the fact that that the information above on your After Visit Summary has been reviewed and is understood.  Full responsibility of the confidentiality of this discharge information lies with you and/or your care-partner.

## 2024-04-13 NOTE — Progress Notes (Signed)
 History and Physical Interval Note:  04/13/2024 3:25 PM  [name]  has presented today for endoscopic procedure(s), with the diagnosis of  Encounter Diagnosis  Name Primary?   Dysphagia, unspecified type Yes  .  The various methods of evaluation and treatment have been discussed with the patient and/or family. After consideration of risks, benefits and other options for treatment, the patient has consented to  the endoscopic procedure(s).   The patient's history has been reviewed, patient examined, no change in status, stable for endoscopic procedure(s).  I have reviewed the patient's chart and labs.  Questions were answered to the patient's satisfaction.     Brendaliz Kuk E. Stacia, MD [facility]

## 2024-04-13 NOTE — Telephone Encounter (Signed)
 Patient has request for refill on 04/13/24. Patient last refill was 02/24/24. Patient has contract on file dated 05/24/23. Patient has upcoming appointment 06/20/24. Updated contract/Sign contract was added to appointment notes. Medication pend and sent to PCP (Ngetich, Dinah C, NP) for approval.

## 2024-04-13 NOTE — Progress Notes (Signed)
 Pt's states no medical or surgical changes since previsit or office visit.   Patient states last sip was today at 1415 to take a Tylenol . Dr. Stacia and Norleen CRNA made aware.

## 2024-04-16 ENCOUNTER — Telehealth: Payer: Self-pay

## 2024-04-16 NOTE — Telephone Encounter (Signed)
 Attempted to reach patient for post-procedure f/u call. No answer. Left message for her to please not hesitate to reach out if she has any questions/concerns regarding her care.

## 2024-04-19 LAB — SURGICAL PATHOLOGY

## 2024-04-21 ENCOUNTER — Ambulatory Visit: Payer: Self-pay | Admitting: Gastroenterology

## 2024-04-21 NOTE — Progress Notes (Signed)
 [name],  The biopsies taken from your stomach were notable for mild chronic gastritis (inflammation) which is a common finding, but there was no evidence of Helicobacter pylori infection.  These changes are likely related to medications like diclofenac .  The biopsies that I took during your recent procedure showed Barrett's mucosa (intestinal metaplasia), but NO sign of the pre-cancerous change called dysplasia.   Therefore, you should repeat upper endoscopy for Barrett's surveillance in 3 years.  You should continue to take a proton pump inhibitor such as omeprazole  daily indefinitely.  You should repeat upper endoscopy in 8 weeks as discussed to assess healing of your esophagitis and consider repeat dilation if you are still having swallowing issues.  We will put you in our reminder system and contact you at that time.

## 2024-05-02 ENCOUNTER — Other Ambulatory Visit: Payer: Self-pay | Admitting: Family

## 2024-05-02 ENCOUNTER — Telehealth: Payer: Self-pay

## 2024-05-02 DIAGNOSIS — Z1231 Encounter for screening mammogram for malignant neoplasm of breast: Secondary | ICD-10-CM

## 2024-05-02 DIAGNOSIS — F909 Attention-deficit hyperactivity disorder, unspecified type: Secondary | ICD-10-CM

## 2024-05-02 NOTE — Telephone Encounter (Signed)
 Copied from CRM [id number]. Topic: Clinical - Request for Lab/Test Order >> May 02, 2024  4:26 PM Graeme ORN wrote: Reason for CRM: Patient called. Went to appt for Mammogram. Was unable to have it done to to concerns with breast and family history. They were supposed to reach back out to her but never did. Called them today - they states provider needs to put in new order. Thank You

## 2024-05-02 NOTE — Telephone Encounter (Signed)
 Contract dated 05/26/2023. Message routed to PCP Ngetich, Roxan BROCKS, NP for approval. Update contract added to upcoming appointment notes

## 2024-05-03 ENCOUNTER — Telehealth: Payer: Self-pay

## 2024-05-03 NOTE — Telephone Encounter (Signed)
 Left a detail voicemail in regards that the new order for her mammogram has been signed.   Message routed to Ngetich, Dinah C, NP

## 2024-05-03 NOTE — Telephone Encounter (Signed)
 Unable to place order for Metadate  CD 20 mg states not reimbursed.

## 2024-05-03 NOTE — Telephone Encounter (Signed)
 Copied from CRM #[id number]. Topic: Clinical - Prescription Issue >> May 03, 2024  8:56 AM Susanna ORN wrote: Reason for CRM: Stacy, Pharmacist, with [facility], called in stating that she wanted to let provider know that they received a prescription on 04/13/24 for methylphenidate  (RITALIN  LA) 20 MG 24 hr capsule. She states they were only able to fill 10 out of the 30 because the product is on backorder. Patient is now in need of more and can't get any because of it being on backorder. They are requesting a new prescription for a similar medication called Metadate  CD 20 mg, which she states is still a long acting capsule. Stacy states they sent a refill request a few days ago and it was denied which she states they do understand because it would show as a refill too soon. Please give a call back to them for any further questions or concerns. CB #: [id number].  Message sent to Ngetich, Dinah C, NP

## 2024-05-04 NOTE — Telephone Encounter (Signed)
 Spoke with Damon Pharmacist, with [facility] to let him know that Ngetich, Dinah C, NP was not able to place order for Metadate  CD 20 mg states not reimbursed. The pharmacist also stated that the medication the patient is taking is on backorder and will not when the new order will come in.  The pharmacist will lets know when the medicine is able to refill and left a detail voicemail for patient to let her about her medication that is on backorder.   Message sent to Ngetich, Dinah C, NP

## 2024-05-04 NOTE — Telephone Encounter (Signed)
 Noted

## 2024-05-07 ENCOUNTER — Telehealth: Payer: Self-pay | Admitting: *Deleted

## 2024-05-07 NOTE — Telephone Encounter (Signed)
 Copied from CRM [id number]. Topic: Clinical - Request for Lab/Test Order >> May 04, 2024  2:56 PM Mercer PEDLAR wrote: Reason for CRM: Patient is requesting an order for a diagnostic MRI to be sent to [facility].

## 2024-05-07 NOTE — Telephone Encounter (Signed)
 Tried calling patient to confirm symptoms of why she is needing a Diagnostic MRI. Patient will need an appointment.    Awaiting call back from patient.

## 2024-05-08 NOTE — Telephone Encounter (Signed)
 Left detailed message informing patient order placed

## 2024-05-09 NOTE — Telephone Encounter (Signed)
 Tried calling patient Forwarded to Clinical.

## 2024-05-09 NOTE — Telephone Encounter (Signed)
 Maximum attempts to contact patient were reached. Additional communication efforts will need to come from the patient

## 2024-05-11 ENCOUNTER — Telehealth: Payer: Self-pay

## 2024-05-11 ENCOUNTER — Telehealth: Payer: Self-pay | Admitting: Family

## 2024-05-11 DIAGNOSIS — F909 Attention-deficit hyperactivity disorder, unspecified type: Secondary | ICD-10-CM

## 2024-05-11 NOTE — Telephone Encounter (Signed)
 High Risk Warning Populated when attempting to refill, I will send to Provider for further review

## 2024-05-11 NOTE — Telephone Encounter (Signed)
 Recommend increasing Ritalin  5 mg dose until 20 mg dose is available.

## 2024-05-11 NOTE — Telephone Encounter (Signed)
 See Triage Notes from Triage Nurse

## 2024-05-11 NOTE — Telephone Encounter (Signed)
 Copied from CRM [id number]. Topic: Clinical - Request for Lab/Test Order >> May 11, 2024  3:33 PM Farrel B wrote: Reason for CRM: Received a phone call from Ms. [name] [phone], she states she's been reaching out to speak to someone since the 15th of October when she was told she wasn't have to have a convention mammogram due to her family history. The patient was instructed to call her pcp and request orders for a diagnostic mammogram, after informing her that there was a difference in diagnostic mammogram and diagnostic MRI patient stated out of nervousness she made a mistake and was requesting the pcp to write orders for a diagnostic mammogram due to family history of breast cancer and she's been dealing pain in her breast as well as sum lumps. Please call patient to advise. I did inform her that several attempts had been made to speak with her, the patient stated she is not always available.

## 2024-05-11 NOTE — Telephone Encounter (Signed)
 Copied from CRM [id number]. Topic: Clinical - Prescription Issue >> May 11, 2024  1:52 PM Mercer PEDLAR wrote: Reason for CRM: Patient is calling regarding methylphenidate  (RITALIN  LA) 20 MG 24 hr capsule. She stated that she was told by the pharmacy that they are unsure if it will be in stock anytime soon and they are requesting to switch to an alternative brand of the medication. She stated that the pharmacy sent a request for the switch but never heard back.   Patient is also requesting a refill for RITALIN  5 MG tablet.  [facility], [place], [place] - [address] [place], [place] [address] Phone: [phone]  Fax: [phone]

## 2024-05-15 NOTE — Telephone Encounter (Signed)
 This encounter was created in error - please disregard.

## 2024-05-16 ENCOUNTER — Telehealth: Payer: Self-pay | Admitting: Family

## 2024-05-16 DIAGNOSIS — F909 Attention-deficit hyperactivity disorder, unspecified type: Secondary | ICD-10-CM

## 2024-05-16 MED ORDER — METHYLPHENIDATE HCL 5 MG PO TABS: 20.0000 mg | ORAL_TABLET | Freq: Every day | ORAL | 0 refills | Status: DC

## 2024-05-16 NOTE — Telephone Encounter (Signed)
 Copied from CRM #[id number]. Topic: Clinical - Prescription Issue >> May 16, 2024  3:49 PM [name] wrote: Pt [name] calling because she was informed by her pharmacy  [name] - [place], [place] - [address] [place] [address] Phone: [phone] Fax: [phone]  that the medication: methylphenidate  (RITALIN  LA) 20 MG 24 hr capsule,  is no longer available and and alternative medication needs to be called in.    Patient states she is currently out of mediation.  Please advise

## 2024-05-16 NOTE — Addendum Note (Signed)
 Addended by: SUELLEN DEVIN BROCKS on: 05/16/2024 05:02 PM   Modules accepted: Orders

## 2024-05-16 NOTE — Addendum Note (Signed)
 Addended byBETHA LEONARDA BURDOCK C on: 05/16/2024 08:43 PM   Modules accepted: Orders

## 2024-05-16 NOTE — Telephone Encounter (Signed)
 Patient is in agreement with recommendation

## 2024-05-16 NOTE — Telephone Encounter (Signed)
 Methaylphenidate Medication refilled.

## 2024-05-16 NOTE — Telephone Encounter (Signed)
 Left message on voicemail for patient to return call when available

## 2024-05-16 NOTE — Telephone Encounter (Signed)
 Recommended in on previous messages to take 4 tablets of the 5 mg of Ritalin .If 5 mg tablet not available consider referral to Psychiatrist to evaluate for other alternative medication.

## 2024-05-17 ENCOUNTER — Other Ambulatory Visit: Payer: Self-pay

## 2024-05-17 ENCOUNTER — Telehealth: Payer: Self-pay

## 2024-05-17 NOTE — Telephone Encounter (Signed)
 Noted

## 2024-05-17 NOTE — Telephone Encounter (Signed)
 Please see previous Mychart messages pharmacy reported nationwide shortages of 20 mg tablet.

## 2024-05-17 NOTE — Telephone Encounter (Signed)
Patients PA has been approved. 

## 2024-05-17 NOTE — Telephone Encounter (Signed)
 Patients insurance called in regards to PA , she stated that the supply for methylphenidate  clinical rational exceeds the limit which is 120 tablets for a 30 day supply. They would like to know if the patient is taking 4 tablets all at one time / are they spaced out , and why wasn't 20mg  of the medication prescribed.  She stated they need a detailed information regarding pts direction medication as this is urgent. Please advise.

## 2024-05-17 NOTE — Telephone Encounter (Signed)
 PA started for pts medication : : [id number]  methylphenidate 

## 2024-05-18 NOTE — Telephone Encounter (Signed)
 Copied from CRM [id number]. Topic: Clinical - Prescription Issue >> May 17, 2024  3:07 PM Mercer PEDLAR wrote: Reason for CRM: [name] is calling from [facility] regarding prior auth they received. She is requesting quantity limit exeption on methylphenidate  (RITALIN ) 5 MG tablet for clinical rational for the exceeding quantity. [name] stated that it is urgent and they need to make a determination within 24 hours. Transferred to clinic.   Fax: [phone] Callback: [phone]  option - 5 >> May 17, 2024  3:27 PM Mercer PEDLAR wrote: [name] Medicare stating that request was approved and fax will be sent to clinic today 05/17/24.

## 2024-05-18 NOTE — Telephone Encounter (Signed)
 Spoke with someone from [facility] regarding clincal rational of medication exceeding limit. Informed them about the [place] with the pharmacy from telephone encounter on 05/11/2024. The agent stated she will get this information over to Clinical for them to review it and someone will contact both us  and the patient soon regarding the outcome. Was given a case number Case id : [id number]

## 2024-05-21 ENCOUNTER — Other Ambulatory Visit: Payer: Self-pay | Admitting: Family

## 2024-05-21 DIAGNOSIS — G8928 Other chronic postprocedural pain: Secondary | ICD-10-CM

## 2024-05-22 NOTE — Telephone Encounter (Signed)
 Patient has request for refill on 05/22/2024.  Patient last refill was 12/14/2023. Patient currently doesn't have a contract on file regarding tramadol .  Patient has upcoming appointment 06/20/2024. Updated contract/Sign contract was added to appointment notes. Medication pend and sent to PCP (Ngetich, Dinah C, NP) for approval.

## 2024-05-30 ENCOUNTER — Encounter: Admitting: Gastroenterology

## 2024-05-31 ENCOUNTER — Ambulatory Visit: Admitting: Gastroenterology

## 2024-06-20 ENCOUNTER — Encounter: Payer: Self-pay | Admitting: Family

## 2024-06-24 NOTE — Progress Notes (Signed)
 This encounter was created in error - please disregard.

## 2024-06-28 ENCOUNTER — Encounter: Payer: Self-pay | Admitting: Family

## 2024-06-28 ENCOUNTER — Ambulatory Visit: Admitting: Family

## 2024-06-28 VITALS — BP 124/74 | HR 90 | Temp 98.0°F | Ht 63.0 in | Wt 131.6 lb

## 2024-06-28 DIAGNOSIS — Z13220 Encounter for screening for lipoid disorders: Secondary | ICD-10-CM

## 2024-06-28 DIAGNOSIS — F331 Major depressive disorder, recurrent, moderate: Secondary | ICD-10-CM

## 2024-06-28 DIAGNOSIS — R7303 Prediabetes: Secondary | ICD-10-CM

## 2024-06-28 DIAGNOSIS — M545 Low back pain, unspecified: Secondary | ICD-10-CM

## 2024-06-28 DIAGNOSIS — E039 Hypothyroidism, unspecified: Secondary | ICD-10-CM

## 2024-06-28 DIAGNOSIS — K219 Gastro-esophageal reflux disease without esophagitis: Secondary | ICD-10-CM

## 2024-06-28 DIAGNOSIS — Z1211 Encounter for screening for malignant neoplasm of colon: Secondary | ICD-10-CM

## 2024-06-28 DIAGNOSIS — F909 Attention-deficit hyperactivity disorder, unspecified type: Secondary | ICD-10-CM

## 2024-06-28 DIAGNOSIS — G43109 Migraine with aura, not intractable, without status migrainosus: Secondary | ICD-10-CM

## 2024-06-28 DIAGNOSIS — D509 Iron deficiency anemia, unspecified: Secondary | ICD-10-CM

## 2024-06-28 DIAGNOSIS — Z23 Encounter for immunization: Secondary | ICD-10-CM

## 2024-06-28 NOTE — Patient Instructions (Signed)
 1.Report to local pharmacy to receive Shingles Vaccine.

## 2024-06-30 LAB — COMPLETE METABOLIC PANEL WITHOUT GFR
AG Ratio: 1.7 (calc) (ref 1.0–2.5)
ALT: 12 U/L (ref 6–29)
AST: 13 U/L (ref 10–35)
Albumin: 4.5 g/dL (ref 3.6–5.1)
Alkaline phosphatase (APISO): 97 U/L (ref 37–153)
BUN/Creatinine Ratio: 19 (calc) (ref 6–22)
BUN: 21 mg/dL (ref 7–25)
CO2: 22 mmol/L (ref 20–32)
Calcium: 9.4 mg/dL (ref 8.6–10.4)
Chloride: 108 mmol/L (ref 98–110)
Creat: 1.1 mg/dL — ABNORMAL HIGH (ref 0.60–1.00)
Globulin: 2.6 g/dL (ref 1.9–3.7)
Glucose, Bld: 96 mg/dL (ref 65–99)
Potassium: 4.1 mmol/L (ref 3.5–5.3)
Sodium: 140 mmol/L (ref 135–146)
Total Bilirubin: 0.4 mg/dL (ref 0.2–1.2)
Total Protein: 7.1 g/dL (ref 6.1–8.1)

## 2024-06-30 LAB — DRUG MONITORING, PANEL 8 WITH CONFIRMATION, URINE
6 Acetylmorphine: NEGATIVE ng/mL (ref <10)
Alcohol Metabolites: NEGATIVE ng/mL (ref <500)
Amphetamine: NEGATIVE ng/mL (ref <250)
Amphetamines: NEGATIVE ng/mL (ref <500)
Benzodiazepines: NEGATIVE ng/mL (ref <100)
Buprenorphine, Urine: NEGATIVE ng/mL (ref <5)
Cocaine Metabolite: NEGATIVE ng/mL (ref <150)
Creatinine: 148 mg/dL (ref >=20.0)
MDMA: NEGATIVE ng/mL (ref <500)
Marijuana Metabolite: NEGATIVE ng/mL (ref <20)
Methamphetamine: NEGATIVE ng/mL (ref <250)
Opiates: NEGATIVE ng/mL (ref <100)
Oxidant: NEGATIVE ug/mL (ref <200)
Oxycodone: NEGATIVE ng/mL (ref <100)
pH: 5.7 (ref 4.5–9.0)

## 2024-06-30 LAB — CBC WITH DIFFERENTIAL/PLATELET
Absolute Lymphocytes: 1747 {cells}/uL (ref 850–3900)
Absolute Monocytes: 604 {cells}/uL (ref 200–950)
Basophils Absolute: 99 {cells}/uL (ref 0–200)
Basophils Relative: 1.4 %
Eosinophils Absolute: 178 {cells}/uL (ref 15–500)
Eosinophils Relative: 2.5 %
HCT: 40.8 % (ref 35.9–46.0)
Hemoglobin: 13.1 g/dL (ref 11.7–15.5)
MCH: 28.1 pg (ref 27.0–33.0)
MCHC: 32.1 g/dL (ref 31.6–35.4)
MCV: 87.4 fL (ref 81.4–101.7)
MPV: 10 fL (ref 7.5–12.5)
Monocytes Relative: 8.5 %
Neutro Abs: 4473 {cells}/uL (ref 1500–7800)
Neutrophils Relative %: 63 %
Platelets: 391 Thousand/uL (ref 140–400)
RBC: 4.67 Million/uL (ref 3.80–5.10)
RDW: 14.6 % (ref 11.0–15.0)
Total Lymphocyte: 24.6 %
WBC: 7.1 Thousand/uL (ref 3.8–10.8)

## 2024-06-30 LAB — HEMOGLOBIN A1C
Hgb A1c MFr Bld: 5.8 % — ABNORMAL HIGH (ref <5.7)
Mean Plasma Glucose: 120 mg/dL
eAG (mmol/L): 6.6 mmol/L

## 2024-06-30 LAB — LIPID PANEL
Cholesterol: 173 mg/dL (ref <200)
HDL: 78 mg/dL (ref >=50)
LDL Cholesterol (Calc): 81 mg/dL
Non-HDL Cholesterol (Calc): 95 mg/dL (ref <130)
Total CHOL/HDL Ratio: 2.2 (calc) (ref <5.0)
Triglycerides: 67 mg/dL (ref <150)

## 2024-06-30 LAB — DM TEMPLATE

## 2024-06-30 LAB — TSH: TSH: 0.62 m[IU]/L (ref 0.40–4.50)

## 2024-07-02 ENCOUNTER — Other Ambulatory Visit: Payer: Self-pay | Admitting: Family

## 2024-07-02 DIAGNOSIS — E039 Hypothyroidism, unspecified: Secondary | ICD-10-CM

## 2024-07-02 NOTE — Progress Notes (Signed)
 Provider: Roxan Plough FNP-C   Johm Pfannenstiel, Roxan BROCKS, NP  Patient Care Team: Cherry Turlington, Roxan BROCKS, NP as PCP - General (Family Medicine)  Extended Emergency Contact Information Primary Emergency Contact: Dorval,Peter  [place] Mobile Phone: [phone] Relation: Son Secondary Emergency Contact: Pichard,Christian Mobile Phone: [phone] Relation: Son  Code Status: Full code Goals of care: Advanced Directive information    06/28/2024    3:02 PM  Advanced Directives  Does Patient Have a Medical Advance Directive? No  Would patient like information on creating a medical advance directive? No - Patient declined     Chief Complaint  Patient presents with   Medical Management of Chronic Issues    6 Month follow up.    HPI:  Pt is a 73 y.o. female seen today for 20-month follow-up for medical management of chronic diseases.  She is here with her son Maude.  She denies any acute issues. She requests refills on her Reglan  states pharmacy did not have the 20 mg tablets so has been prescribed 4 mg tablet take 5 but thinks the pharmacy currently carries the 20 mg we will switch back to her 1 tablet.  Past Medical History:  Diagnosis Date   ADHD    Anemia    Cellulitis 07/2018   left lower extremity   Chronic kidney disease    surgery to fix   Chronic pain    Depression    major depression   DVT (deep venous thrombosis) (HCC)    Ekbom's delusional parasitosis (HCC)    Fibromyalgia    History of Zenker's diverticulum removal    Lyme disease    Migraine with aura    Multiple falls    Thyroid  disease    hypothyroid   Past Surgical History:  Procedure Laterality Date   APPENDECTOMY  1974   Avascular necrosis of the hip     multiple operations starting in 2005.  bilateral hips   Back fusion  1999   CERVICAL FUSION  8014,8002   EYE SURGERY     eye muscle   HIP SURGERY Left 06/2022   KIDNEY SURGERY  1971   kidney reconstruction   LAMINECTOMY  1985    ORTHOPEDIC SURGERY  11/23/2023   SURGERY IN [place]    TOTAL HIP ARTHROPLASTY      Allergies[1]  Allergies as of 06/28/2024       Reactions   Amoxicillin-pot Clavulanate Nausea And Vomiting, Other (See Comments)   Clarithromycin Other (See Comments)   DOES NOT REMEMBER REACTION DOES NOT REMEMBER REACTION   Fentanyl Nausea And Vomiting, Other (See Comments)   FENTANYL PATCH FENTANYL PATCH FENTANYL PATCH   Levofloxacin Swelling   Face,eye swelling Face,eye swelling   Lorazepam  Other (See Comments)   DO NOT PUT ANY ATIVAN  ON PATIENT'S IV POST SURGERY! DO NOT PUT ANY ATIVAN  ON PATIENT'S IV POST SURGERY!   Amoxicillin Other (See Comments)   Patient states does not remember reaction   Doxycycline Nausea And Vomiting   Penicillins Hives   Sulfa Antibiotics Other (See Comments)   Per pt: unknown        Medication List        Accurate as of June 28, 2024 11:59 PM. If you have any questions, ask your nurse or doctor.          buPROPion  HCl ER (XL) 450 MG Tb24 TAKE ONE TABLET BY MOUTH EVERY DAY   DICLOFENAC  SODIUM PO Take 100 mg by mouth as needed.   Diclofenac   Sodium CR 100 MG 24 hr tablet TAKE ONE TABLET DAILY WITH FOOD   escitalopram  20 MG tablet Commonly known as: LEXAPRO  TAKE ONE TABLET BY MOUTH ONCE DAILY   levothyroxine  125 MCG tablet Commonly known as: SYNTHROID  TAKE ONE TABLET BY MOUTH DAILY   methocarbamol  500 MG tablet Commonly known as: ROBAXIN  TAKE 1 OR 2 TABLETS AS NEEDED FOR MUSCLE SPASM   methylphenidate  5 MG tablet Commonly known as: Ritalin  Take 4 tablets (20 mg total) by mouth daily.   ondansetron  4 MG tablet Commonly known as: ZOFRAN  Take 1 tablet (4 mg total) by mouth every 8 (eight) hours as needed for nausea or vomiting.   oxyCODONE  5 MG immediate release tablet Commonly known as: Oxy IR/ROXICODONE  Take 5 mg by mouth every 8 (eight) hours.   pantoprazole  40 MG tablet Commonly known as: PROTONIX  Take 1 tablet (40 mg total)  by mouth daily.   pantoprazole  40 MG tablet Commonly known as: PROTONIX  Take 1 tablet (40 mg total) by mouth 2 (two) times daily. Take twice daily for 8 weeks then continue once daily.   Promethegan 25 MG suppository Generic drug: promethazine  place ONE-HALF SUPPOSITORY (12.5mg  total) rectally every SIX hours AS NEEDED FOR NAUSEA AND VOMITING   promethazine  12.5 MG suppository Commonly known as: PHENERGAN  Place 1 suppository (12.5 mg total) rectally every 6 (six) hours as needed for nausea or vomiting.   senna 8.6 MG tablet Commonly known as: SENOKOT Take 1 tablet by mouth daily as needed for constipation.   topiramate  50 MG tablet Commonly known as: TOPAMAX  Take 1 tablet (50 mg total) by mouth 2 (two) times daily.   traMADol  50 MG tablet Commonly known as: ULTRAM  Take 1 tablet (50 mg total) by mouth every 6 (six) hours as needed for moderate pain (pain score 4-6).        Review of Systems  Constitutional:  Negative for appetite change, chills, fatigue, fever and unexpected weight change.  HENT:  Negative for congestion, dental problem, ear discharge, ear pain, facial swelling, hearing loss, nosebleeds, postnasal drip, rhinorrhea, sinus pressure, sinus pain, sneezing, sore throat, tinnitus and trouble swallowing.   Eyes:  Negative for pain, discharge, redness, itching and visual disturbance.  Respiratory:  Negative for cough, chest tightness, shortness of breath and wheezing.   Cardiovascular:  Negative for chest pain, palpitations and leg swelling.  Gastrointestinal:  Negative for abdominal distention, abdominal pain, blood in stool, constipation, diarrhea, nausea and vomiting.  Endocrine: Negative for cold intolerance, heat intolerance, polydipsia, polyphagia and polyuria.  Genitourinary:  Negative for difficulty urinating, dysuria, flank pain, frequency and urgency.  Musculoskeletal:  Positive for arthralgias and gait problem. Negative for back pain, joint swelling,  myalgias, neck pain and neck stiffness.  Skin:  Negative for color change, pallor, rash and wound.  Neurological:  Negative for dizziness, syncope, speech difficulty, weakness, light-headedness, numbness and headaches.  Hematological:  Does not bruise/bleed easily.  Psychiatric/Behavioral:  Positive for decreased concentration. Negative for agitation, behavioral problems, confusion, hallucinations, self-injury, sleep disturbance and suicidal ideas. The patient is not nervous/anxious.     Immunization History  Administered Date(s) Administered   INFLUENZA, HIGH DOSE SEASONAL PF 06/09/2018, 09/07/2019, 06/07/2022, 06/28/2024   Influenza-Unspecified 09/07/2019   PFIZER(Purple Top)SARS-COV-2 Vaccination 11/19/2019, 02/12/2020   Pneumococcal Conjugate-13 08/22/2015   Pneumococcal Polysaccharide-23 11/10/2020   Pertinent  Health Maintenance Due  Topic Date Due   Colonoscopy  08/28/2024 (Originally 12/01/2021)   Influenza Vaccine  Completed   Bone Density Scan  Completed   Mammogram  Discontinued      01/10/2023    3:36 PM 01/26/2023    3:29 PM 12/14/2023   11:22 AM 12/29/2023   11:35 AM 06/28/2024    3:02 PM  Fall Risk  Falls in the past year? 0 1 0 1 0  Was there an injury with Fall? 0  1  0  0  0  Fall Risk Category Calculator 0 3 0 1 0  Patient at Risk for Falls Due to No Fall Risks No Fall Risks History of fall(s) Impaired mobility No Fall Risks  Fall risk Follow up Falls evaluation completed Falls evaluation completed Falls evaluation completed Falls evaluation completed Falls evaluation completed     Data saved with a previous flowsheet row definition   Functional Status Survey:    Vitals:   06/28/24 1508  BP: 124/74  Pulse: 90  Temp: 98 F (36.7 C)  SpO2: 98%  Weight: 131 lb 9.9 oz (59.7 kg)  Height: 5' 3 (1.6 m)   Body mass index is 23.32 kg/m. Physical Exam Vitals reviewed.  Constitutional:      General: She is not in acute distress.    Appearance: Normal  appearance. She is normal weight. She is not ill-appearing or diaphoretic.  HENT:     Head: Normocephalic.     Right Ear: Tympanic membrane, ear canal and external ear normal. There is no impacted cerumen.     Left Ear: Tympanic membrane, ear canal and external ear normal. There is no impacted cerumen.     Nose: Nose normal. No congestion or rhinorrhea.     Mouth/Throat:     Mouth: Mucous membranes are moist.     Pharynx: Oropharynx is clear. No oropharyngeal exudate or posterior oropharyngeal erythema.  Eyes:     General: No scleral icterus.       Right eye: No discharge.        Left eye: No discharge.     Extraocular Movements: Extraocular movements intact.     Conjunctiva/sclera: Conjunctivae normal.     Pupils: Pupils are equal, round, and reactive to light.  Neck:     Vascular: No carotid bruit.  Cardiovascular:     Rate and Rhythm: Normal rate and regular rhythm.     Pulses: Normal pulses.     Heart sounds: Normal heart sounds. No murmur heard.    No friction rub. No gallop.  Pulmonary:     Effort: Pulmonary effort is normal. No respiratory distress.     Breath sounds: Normal breath sounds. No wheezing, rhonchi or rales.  Chest:     Chest wall: No tenderness.  Abdominal:     General: Bowel sounds are normal. There is no distension.     Palpations: Abdomen is soft. There is no mass.     Tenderness: There is no abdominal tenderness. There is no right CVA tenderness, left CVA tenderness, guarding or rebound.  Musculoskeletal:        General: No swelling or tenderness. Normal range of motion.     Cervical back: Normal range of motion. No rigidity or tenderness.     Right lower leg: No edema.     Left lower leg: No edema.  Lymphadenopathy:     Cervical: No cervical adenopathy.  Skin:    General: Skin is warm and dry.     Coloration: Skin is not pale.     Findings: No bruising, erythema, lesion or rash.  Neurological:     Mental Status: She is alert and  oriented to person,  place, and time.     Cranial Nerves: No cranial nerve deficit.     Sensory: No sensory deficit.     Motor: No weakness.     Coordination: Coordination normal.     Gait: Gait abnormal.  Psychiatric:        Mood and Affect: Mood normal.        Speech: Speech normal.        Behavior: Behavior normal.        Thought Content: Thought content normal.        Judgment: Judgment normal.    Labs reviewed: Recent Labs    12/28/23 1501 12/28/23 1523 06/28/24 1559  NA 136 142 140  K 4.1 4.0 4.1  CL 108 108 108  CO2 18*  --  22  GLUCOSE 91 91 96  BUN 16 16 21   CREATININE 0.88 0.90 1.10*  CALCIUM 9.1  --  9.4   Recent Labs    12/28/23 1501 06/28/24 1559  AST 19 13  ALT 15 12  ALKPHOS 69  --   BILITOT 0.4 0.4  PROT 6.8 7.1  ALBUMIN 3.8  --    Recent Labs    12/28/23 1501 12/28/23 1523 06/28/24 1559  WBC 6.2  --  7.1  NEUTROABS 3.8  --  4,473  HGB 11.7* 12.6 13.1  HCT 37.1 37.0 40.8  MCV 87.7  --  87.4  PLT 342  --  391   Lab Results  Component Value Date   TSH 0.62 06/28/2024   Lab Results  Component Value Date   HGBA1C 5.8 (H) 06/28/2024   Lab Results  Component Value Date   CHOL 173 06/28/2024   HDL 78 06/28/2024   LDLCALC 81 06/28/2024   TRIG 67 06/28/2024   CHOLHDL 2.2 06/28/2024    Significant Diagnostic Results in last 30 days:  No results found.  Assessment/Plan 1. Immunization due Flut shot administered by CMA no acute reaction reported.   - Flu vaccine HIGH DOSE PF(Fluzone Trivalent)  2. Attention deficit hyperactivity disorder (ADHD), unspecified ADHD type - Continues to require Ritalin  -PDMR reviewed and contract updated - DRUG MONITORING, PANEL 8 WITH CONFIRMATION, URINE  3. Prediabetes Lab Results  Component Value Date   HGBA1C 5.8 (H) 06/28/2024  - Dietary modification and exercise advised - TSH - COMPLETE METABOLIC PANEL WITHOUT GFR - CBC with Differential/Platelet - Hemoglobin A1c  4. Moderate episode of recurrent major  depressive disorder (HCC) Mood stable -Continue on current medication - TSH  5. Gastroesophageal reflux disease, unspecified whether esophagitis present Controlled - CBC with Differential/Platelet  6. Iron deficiency anemia, unspecified iron deficiency anemia type Latest hemoglobin slightly low . willrecheck Hemoglobin - CBC with Differential/Platelet  7. Migraine with aura and without status migrainosus, not intractable Chronic - Continue current pain regimen 8. Acquired hypothyroidism (Primary) Continue on levothyroxine  daily on empty stomach - TSH  9. Chronic right-sided low back pain without sciatica Chronic - Continue to follow-up with orthopedic  10. Colon cancer screening Prefers Cologuard - Cologuard  11. Screening for hyperlipidemia Dietary modification and exercise advised low exercise limited due to chronic low back pain - Lipid panel  Family/ staff Communication: Reviewed plan of care with patient verbalized understanding  Labs/tests ordered:  - Cologuard - Drug monitoring panel  Next Appointment : Return in about 6 months (around 12/27/2024) for medical mangement of chronic issues.SABRA   Spent 30 minutes of Face to face and non-face to face with patient  >  50% time spent counseling; reviewing medical record; tests; labs; documentation and developing future plan of care.   Justun Anaya C Josiel Gahm, NP      [1]  Allergies Allergen Reactions   Amoxicillin-Pot Clavulanate Nausea And Vomiting and Other (See Comments)   Clarithromycin Other (See Comments)    DOES NOT REMEMBER REACTION DOES NOT REMEMBER REACTION    Fentanyl Nausea And Vomiting and Other (See Comments)    FENTANYL PATCH FENTANYL PATCH FENTANYL PATCH    Levofloxacin Swelling    Face,eye swelling Face,eye swelling    Lorazepam  Other (See Comments)    DO NOT PUT ANY ATIVAN  ON PATIENT'S IV POST SURGERY! DO NOT PUT ANY ATIVAN  ON PATIENT'S IV POST SURGERY!    Amoxicillin Other (See Comments)     Patient states does not remember reaction   Doxycycline Nausea And Vomiting   Penicillins Hives   Sulfa Antibiotics Other (See Comments)    Per pt: unknown

## 2024-07-05 ENCOUNTER — Ambulatory Visit: Payer: Self-pay | Admitting: Family

## 2024-07-09 ENCOUNTER — Telehealth: Payer: Self-pay

## 2024-07-09 NOTE — Telephone Encounter (Signed)
 Received fax from pharmacy about Oxycodone  refill,medication was marked as not taken on 06-28-2024.  Left message on voicemail for patient to return call when available    Sent fax back telling pharmacy to have patient to call our office.

## 2024-07-09 NOTE — Telephone Encounter (Signed)
 Patient is returning call, transferred to clinic.

## 2024-07-09 NOTE — Telephone Encounter (Signed)
 Pt requesting a refill on Oxycodone  ,on her office visit 06-27-2024 she reported she is not taking.The patient doctor in [place]  was the prescriber of the medication he informed patient her primary care would have to refill.

## 2024-07-10 NOTE — Telephone Encounter (Signed)
 Continue with tramadol  for pain.Follow up with pain management if requiring refills on oxycodone .

## 2024-07-17 NOTE — Telephone Encounter (Signed)
 Call was placed to patient,was unable to leave voicemail due to patient not having a voicemail.

## 2024-07-18 NOTE — Telephone Encounter (Signed)
Tried calling patient, LMOM to return call.

## 2024-07-20 NOTE — Telephone Encounter (Signed)
 Tried calling patient, LMOM to return call. Forwarded to CI, Heather

## 2024-07-23 NOTE — Telephone Encounter (Signed)
 Called and spoke with patient and she stated that she will continue the Tramadol .

## 2024-07-23 NOTE — Telephone Encounter (Signed)
 Left message on voicemail for patient to return call when available

## 2024-07-23 NOTE — Telephone Encounter (Signed)
 Boomer, Beatrice, [place]  and May, Donzell LABOR, [place]    Patient Goodness is returning your call.   Please advise

## 2024-07-23 NOTE — Telephone Encounter (Signed)
 Copied from CRM [id number]. Topic: General - Other >> Jul 23, 2024 11:19 AM Susanna ORN wrote: Reason for CRM: Patient returning missed call from [facility]. As I was waiting to speak with CAL, patient disconnected. Please give patient a call back. CB #: [phone].

## 2024-07-31 ENCOUNTER — Other Ambulatory Visit: Payer: Self-pay | Admitting: Family

## 2024-07-31 DIAGNOSIS — F909 Attention-deficit hyperactivity disorder, unspecified type: Secondary | ICD-10-CM

## 2024-07-31 NOTE — Telephone Encounter (Signed)
 Patient is requesting a refill of the following medications: Requested Prescriptions   Pending Prescriptions Disp Refills   RITALIN  5 MG tablet [Pharmacy Med Name: Ritalin  5 mg tablet] 120 tablet 0    Sig: TAKE 4 TABLETS (20mg  total) BY MOUTH DAILY    Date of last refill: 05/16/2024  Refill amount: 120 tabs 0 refills  Treatment agreement date: 06/19/2024

## 2024-08-01 ENCOUNTER — Other Ambulatory Visit: Payer: Self-pay | Admitting: Family

## 2024-08-01 DIAGNOSIS — F909 Attention-deficit hyperactivity disorder, unspecified type: Secondary | ICD-10-CM

## 2024-08-01 NOTE — Telephone Encounter (Signed)
 Patient is requesting a refill of the following medications: Requested Prescriptions   Pending Prescriptions Disp Refills   methylphenidate  (RITALIN ) 5 MG tablet 120 tablet 0    Sig: Take 4 tablets (20 mg total) by mouth daily.    Date of last refill: 05/16/2024  Refill amount: 120 tabs 0 refills  Treatment agreement date: 06/19/2024

## 2024-08-01 NOTE — Telephone Encounter (Unsigned)
 Copied from CRM [id number]. Topic: Clinical - Medication Refill >> Aug 01, 2024 11:44 AM Debby BROCKS wrote: Medication: methylphenidate  (RITALIN ) 5 MG tablet  Has the patient contacted their pharmacy? Yes (Agent: If no, request that the patient contact the pharmacy for the refill. If patient does not wish to contact the pharmacy document the reason why and proceed with request.) (Agent: If yes, when and what did the pharmacy advise?)  Patient states that the pharmacy has sent out requests but no response for the refill  This is the patient's preferred pharmacy:  [facility] - [place], [place] - [address] [place] [address] Phone: [phone] Fax: [phone]  Is this the correct pharmacy for this prescription? Yes If no, delete pharmacy and type the correct one.   Has the prescription been filled recently? No  Is the patient out of the medication? Yes  Has the patient been seen for an appointment in the last year OR does the patient have an upcoming appointment? Yes  Can we respond through MyChart? Yes  Agent: Please be advised that Rx refills may take up to 3 business days. We ask that you follow-up with your pharmacy.

## 2024-08-02 ENCOUNTER — Telehealth: Payer: Self-pay

## 2024-08-02 MED ORDER — METHYLPHENIDATE HCL 5 MG PO TABS: 20.0000 mg | ORAL_TABLET | Freq: Every day | ORAL | 0 refills | Status: AC

## 2024-08-02 NOTE — Telephone Encounter (Signed)
 duplicate

## 2024-08-02 NOTE — Telephone Encounter (Signed)
 Copied from CRM [id number]. Topic: Clinical - Medication Refill >> Aug 01, 2024  5:59 PM Chiquita SQUIBB wrote: Patient is calling in stating that it is very important she gets this medication as soon as possible as she has been out. Advised patient of the 3 day turn around time.

## 2024-08-03 NOTE — Telephone Encounter (Signed)
 Medication already refilled.see previous message.

## 2024-08-03 NOTE — Telephone Encounter (Signed)
 This has already been sent but I am not authorized to deny medication please sign off on refusal. Thank you!

## 2024-08-03 NOTE — Telephone Encounter (Signed)
 Request is duplicate.Ritalin  refilled yesterday 08/02/2024

## 2024-08-03 NOTE — Telephone Encounter (Signed)
 Left a detail voicemail for the patient to let her know that the medication has already has been refilled. Ok for [employer] to share providers response when patient returns call        Message sent to  [name], CMA who is covering CI

## 2024-08-04 LAB — COLOGUARD: COLOGUARD: NEGATIVE

## 2024-08-06 ENCOUNTER — Telehealth: Payer: Self-pay

## 2024-08-06 DIAGNOSIS — G8928 Other chronic postprocedural pain: Secondary | ICD-10-CM

## 2024-08-06 MED ORDER — TRAMADOL HCL 50 MG PO TABS: 50.0000 mg | ORAL_TABLET | Freq: Four times a day (QID) | ORAL | 3 refills | Status: AC | PRN

## 2024-08-06 NOTE — Telephone Encounter (Signed)
 Copied from CRM [id number]. Topic: Clinical - Medication Question >> Aug 06, 2024  1:35 PM Debby BROCKS wrote: Reason for CRM: Patient states that She needs to get a refill on tramadol  but before placing in the refill would like to speak to a nurse within the practice regarding med management for herself and her son as well.

## 2024-08-06 NOTE — Telephone Encounter (Signed)
 Spoke with E2C2 agent and patient changed her mind about needing to speak to someone.  Patient is requesting a refill of the following medications: Requested Prescriptions   Pending Prescriptions Disp Refills   traMADol  (ULTRAM ) 50 MG tablet 60 tablet 3    Sig: Take 1 tablet (50 mg total) by mouth every 6 (six) hours as needed for moderate pain (pain score 4-6).    Date of last refill: 12/14/2023  Refill amount: 60/3  Treatment agreement date: Not on file, notation made on appointment for June 2026

## 2024-08-06 NOTE — Telephone Encounter (Signed)
 Rx has been sent unable to sign off

## 2024-08-06 NOTE — Telephone Encounter (Signed)
 Left message on voicemail for patient to return call when available

## 2024-12-27 ENCOUNTER — Ambulatory Visit: Admitting: Family
